# Patient Record
Sex: Male | Born: 1945 | Race: Black or African American | Hispanic: No | Marital: Married | Smoking: Former smoker
Health system: Southern US, Community
[De-identification: ages and names within clinical notes are randomized; demographics above are authoritative.]

## PROBLEM LIST (undated history)

## (undated) DIAGNOSIS — I10 Essential (primary) hypertension: Secondary | ICD-10-CM

## (undated) DIAGNOSIS — I5020 Unspecified systolic (congestive) heart failure: Secondary | ICD-10-CM

## (undated) DIAGNOSIS — I4892 Unspecified atrial flutter: Secondary | ICD-10-CM

## (undated) DIAGNOSIS — J449 Chronic obstructive pulmonary disease, unspecified: Secondary | ICD-10-CM

## (undated) DIAGNOSIS — U071 COVID-19: Secondary | ICD-10-CM

## (undated) DIAGNOSIS — E785 Hyperlipidemia, unspecified: Secondary | ICD-10-CM

## (undated) DIAGNOSIS — I639 Cerebral infarction, unspecified: Secondary | ICD-10-CM

## (undated) HISTORY — PX: HERNIA REPAIR: SHX51

## (undated) HISTORY — DX: Chronic obstructive pulmonary disease, unspecified: J44.9

## (undated) HISTORY — DX: Unspecified systolic (congestive) heart failure: I50.20

## (undated) HISTORY — DX: Essential (primary) hypertension: I10

## (undated) HISTORY — DX: Hyperlipidemia, unspecified: E78.5

## (undated) HISTORY — DX: Cerebral infarction, unspecified: I63.9

## (undated) HISTORY — DX: Unspecified atrial flutter: I48.92

---

## 2003-07-31 ENCOUNTER — Emergency Department (HOSPITAL_COMMUNITY): Admission: EM | Admit: 2003-07-31 | Discharge: 2003-07-31 | Payer: Self-pay | Admitting: Emergency Medicine

## 2008-08-11 ENCOUNTER — Emergency Department (HOSPITAL_COMMUNITY): Admission: EM | Admit: 2008-08-11 | Discharge: 2008-08-12 | Payer: Self-pay | Admitting: Emergency Medicine

## 2008-08-15 ENCOUNTER — Emergency Department (HOSPITAL_COMMUNITY): Admission: EM | Admit: 2008-08-15 | Discharge: 2008-08-16 | Payer: Self-pay | Admitting: Emergency Medicine

## 2009-11-12 ENCOUNTER — Encounter (INDEPENDENT_AMBULATORY_CARE_PROVIDER_SITE_OTHER): Payer: Self-pay | Admitting: *Deleted

## 2009-11-26 ENCOUNTER — Ambulatory Visit: Payer: Self-pay | Admitting: Internal Medicine

## 2009-11-26 DIAGNOSIS — K4090 Unilateral inguinal hernia, without obstruction or gangrene, not specified as recurrent: Secondary | ICD-10-CM | POA: Insufficient documentation

## 2009-11-26 DIAGNOSIS — J42 Unspecified chronic bronchitis: Secondary | ICD-10-CM | POA: Insufficient documentation

## 2009-11-26 DIAGNOSIS — Z8711 Personal history of peptic ulcer disease: Secondary | ICD-10-CM

## 2009-11-26 DIAGNOSIS — F17200 Nicotine dependence, unspecified, uncomplicated: Secondary | ICD-10-CM | POA: Insufficient documentation

## 2009-11-28 ENCOUNTER — Ambulatory Visit: Payer: Self-pay | Admitting: Internal Medicine

## 2009-12-02 ENCOUNTER — Ambulatory Visit: Payer: Self-pay | Admitting: Internal Medicine

## 2009-12-02 LAB — CONVERTED CEMR LAB
BUN: 11 mg/dL (ref 6–23)
Basophils Absolute: 0 10*3/uL (ref 0.0–0.1)
Calcium: 9.6 mg/dL (ref 8.4–10.5)
Creatinine, Ser: 1.3 mg/dL (ref 0.4–1.5)
GFR calc non Af Amer: 71.26 mL/min (ref >60)
Lymphs Abs: 2.5 10*3/uL (ref 0.7–4.0)
Monocytes Absolute: 0.6 10*3/uL (ref 0.1–1.0)
Monocytes Relative: 10.4 % (ref 3.0–12.0)
Neutro Abs: 2.6 10*3/uL (ref 1.4–7.7)
Platelets: 258 10*3/uL (ref 150.0–400.0)
Prothrombin Time: 10.1 s (ref 9.1–11.7)
RDW: 14.1 % (ref 11.5–14.6)
WBC: 5.8 10*3/uL (ref 4.5–10.5)

## 2009-12-04 ENCOUNTER — Telehealth (INDEPENDENT_AMBULATORY_CARE_PROVIDER_SITE_OTHER): Payer: Self-pay | Admitting: *Deleted

## 2009-12-04 ENCOUNTER — Encounter (INDEPENDENT_AMBULATORY_CARE_PROVIDER_SITE_OTHER): Payer: Self-pay | Admitting: *Deleted

## 2009-12-10 ENCOUNTER — Encounter (INDEPENDENT_AMBULATORY_CARE_PROVIDER_SITE_OTHER): Payer: Self-pay | Admitting: *Deleted

## 2009-12-18 ENCOUNTER — Telehealth: Payer: Self-pay | Admitting: Internal Medicine

## 2009-12-23 ENCOUNTER — Ambulatory Visit (HOSPITAL_COMMUNITY): Admission: RE | Admit: 2009-12-23 | Discharge: 2009-12-23 | Payer: Self-pay | Admitting: Internal Medicine

## 2009-12-25 ENCOUNTER — Encounter (INDEPENDENT_AMBULATORY_CARE_PROVIDER_SITE_OTHER): Payer: Self-pay | Admitting: *Deleted

## 2011-01-15 NOTE — Progress Notes (Signed)
Summary: CT CHEST REFERRAL  Phone Note Outgoing Call   Call placed by: Magdalen Spatz [facility],  December 18, 2009 8:39 AM Call placed to: Patient Summary of Call: REFERENCE CT SCAN OF CHEST REFERRAL...Marland KitchenPATIENT'S CURRENT INSUR PLAN IS A "DISCOUNT" PLAN, AND WILL NOT COVER ANY DIAGNOSTIC TESTING.  I S/W PT ABOUT THIS, HE INFORMS ME HE IS IN THE PROCESS OF CHANGING INSURANCE COVERAGE, AND PREFERS TO WAIT ON HAVING CT UNTIL LATE JANUARY.  IS IT OK FOR PT TO WAIT THAT LONG FOR CT?  PLEASE ADVISE. Initial call taken by: Magdalen Spatz [facility],  December 18, 2009 8:39 AM  Follow-up for Phone Call        Dr.Hopper please advise  Follow-up by: Shonna Chock,  December 18, 2009 9:00 AM  Additional Follow-up for Phone Call Additional follow up Details #1::        this should be addressed ASAP Additional Follow-up by: Marga Melnick MD,  December 18, 2009 9:42 AM    Additional Follow-up for Phone Call Additional follow up Details #2::    I spoke with patient informed him of above.  He is sch'd for CT Chest on 12-23-2009 at [facility] (Per patient's request). Follow-up by: Magdalen Spatz [facility],  December 18, 2009 11:49 AM  Additional Follow-up for Phone Call Additional follow up Details #3:: Details for Additional Follow-up Action Taken: good Additional Follow-up by: Marga Melnick MD,  December 18, 2009 1:33 PM

## 2011-01-15 NOTE — Letter (Signed)
Summary: Results Follow up Letter  [facility] [place]  [address] [place], [place] [address]   Phone: [phone]  Fax: [phone]    12/25/2009 MRN: [id number]  [facility] [address] [place], [place]  [address]  Dear Mr. Alameda,  The following are the results of your recent test(s):  Test         Result    Pap Smear:        Normal _____  Not Normal _____ Comments: ______________________________________________________ Cholesterol: LDL(Bad cholesterol):         Your goal is less than:         HDL (Good cholesterol):       Your goal is more than: Comments:  ______________________________________________________ Mammogram:        Normal _____  Not Normal _____ Comments:  ___________________________________________________________________ Hemoccult:        Normal _____  Not normal _______ Comments:    _____________________________________________________________________ Other Tests: Please see attached labs done on 12/23/09    We routinely do not discuss normal results over the telephone.  If you desire a copy of the results, or you have any questions about this information we can discuss them at your next office visit.   Sincerely,

## 2011-09-16 LAB — WOUND CULTURE: Gram Stain: NONE SEEN

## 2011-09-16 LAB — CBC
HCT: 39.7
Platelets: 313
RBC: 4.04 — ABNORMAL LOW

## 2011-09-16 LAB — DIFFERENTIAL
Basophils Absolute: 0.1
Basophils Relative: 1
Eosinophils Absolute: 0.2
Eosinophils Relative: 2
Lymphocytes Relative: 35
Monocytes Absolute: 0.5
Monocytes Relative: 6
Neutro Abs: 4.3

## 2013-04-28 ENCOUNTER — Emergency Department (HOSPITAL_COMMUNITY): Payer: Medicare Other

## 2013-04-28 ENCOUNTER — Emergency Department (HOSPITAL_COMMUNITY)
Admission: EM | Admit: 2013-04-28 | Discharge: 2013-04-28 | Disposition: A | Discharge status: Home / Self Care | Payer: Medicare Other | Attending: Emergency Medicine | Admitting: Emergency Medicine

## 2013-04-28 ENCOUNTER — Telehealth (HOSPITAL_COMMUNITY): Payer: Self-pay | Admitting: *Deleted

## 2013-04-28 ENCOUNTER — Encounter (HOSPITAL_COMMUNITY): Payer: Self-pay | Admitting: Emergency Medicine

## 2013-04-28 DIAGNOSIS — N452 Orchitis: Secondary | ICD-10-CM

## 2013-04-28 DIAGNOSIS — N453 Epididymo-orchitis: Secondary | ICD-10-CM | POA: Insufficient documentation

## 2013-04-28 DIAGNOSIS — F17200 Nicotine dependence, unspecified, uncomplicated: Secondary | ICD-10-CM | POA: Insufficient documentation

## 2013-04-28 LAB — BASIC METABOLIC PANEL
CO2: 29 mEq/L (ref 19–32)
Chloride: 104 mEq/L (ref 96–112)
Potassium: 5.1 mEq/L (ref 3.5–5.1)
Sodium: 140 mEq/L (ref 135–145)

## 2013-04-28 LAB — CBC WITH DIFFERENTIAL/PLATELET
Eosinophils Relative: 1 % (ref 0–5)
Lymphs Abs: 1.4 10*3/uL (ref 0.7–4.0)
MCH: 32.9 pg (ref 26.0–34.0)
MCV: 94 fL (ref 78.0–100.0)
Monocytes Relative: 7 % (ref 3–12)
Neutro Abs: 11.9 10*3/uL — ABNORMAL HIGH (ref 1.7–7.7)
RBC: 4.34 MIL/uL (ref 4.22–5.81)

## 2013-04-28 LAB — URINALYSIS, ROUTINE W REFLEX MICROSCOPIC
Bilirubin Urine: NEGATIVE
Glucose, UA: NEGATIVE mg/dL
Ketones, ur: NEGATIVE mg/dL
Protein, ur: NEGATIVE mg/dL
pH: 5 (ref 5.0–8.0)

## 2013-04-28 LAB — URINE MICROSCOPIC-ADD ON

## 2013-04-28 MED ORDER — CIPROFLOXACIN HCL 500 MG PO TABS: 500.0000 mg | ORAL_TABLET | Freq: Two times a day (BID) | ORAL | Status: DC

## 2013-04-28 MED ORDER — CIPROFLOXACIN HCL 500 MG PO TABS
500.0000 mg | ORAL_TABLET | Freq: Once | ORAL | Status: AC
  Administered 2013-04-28: 500 mg via ORAL
  Filled 2013-04-28: qty 1

## 2013-04-28 MED ORDER — IBUPROFEN 800 MG PO TABS
800.0000 mg | ORAL_TABLET | Freq: Once | ORAL | Status: AC
  Administered 2013-04-28: 800 mg via ORAL
  Filled 2013-04-28: qty 1

## 2013-04-28 NOTE — ED Provider Notes (Signed)
History     CSN: [id number]  Arrival date & time 04/28/13  1101   First MD Initiated Contact with Patient 04/28/13 1131      Chief Complaint  Patient presents with  . Groin Swelling    (Consider location/radiation/quality/duration/timing/severity/associated sxs/prior treatment) HPI.... left testicular pain and swelling for 3 days. No urethral discharge. No fever, chills, dysuria. Status post left inguinal hernia repair approximately 3 years ago in [place]. No previous genitourinary problems. Patient is sexually active. Palpation makes symptoms worse. Severity is moderate  History reviewed. No pertinent past medical history.  History reviewed. No pertinent past surgical history.  History reviewed. No pertinent family history.  History  Substance Use Topics  . Smoking status: Current Every Day Smoker  . Smokeless tobacco: Not on file  . Alcohol Use: Yes      Review of Systems  All other systems reviewed and are negative.    Allergies  Review of patient's allergies indicates no known allergies.  Home Medications   Current Outpatient Rx  Name  Route  Sig  Dispense  Refill  . PRESCRIPTION MEDICATION   Oral   Take 1 tablet by mouth 3 (three) times daily as needed (PAIN medication).         . ciprofloxacin (CIPRO) 500 MG tablet   Oral   Take 1 tablet (500 mg total) by mouth 2 (two) times daily. One po bid x 7 days   28 tablet   0     BP 124/105  Pulse 63  Temp(Src) 98 F (36.7 C) (Oral)  Resp 16  SpO2 98%  Physical Exam  Nursing note and vitals reviewed. Constitutional: He is oriented to person, place, and time. He appears well-developed and well-nourished.  HENT:  Head: Normocephalic and atraumatic.  Eyes: Conjunctivae and EOM are normal. Pupils are equal, round, and reactive to light.  Neck: Normal range of motion. Neck supple.  Cardiovascular: Normal rate, regular rhythm and normal heart sounds.   Pulmonary/Chest: Effort normal and breath  sounds normal.  Abdominal: Soft. Bowel sounds are normal.  Genitourinary:  Normal uncircumcised penis.  Right testicle appears normal to palpation.  Left testicle is boggy, edematous and 30% larger than the right.  Left-sided epididymis tender  Musculoskeletal: Normal range of motion.  Neurological: He is alert and oriented to person, place, and time.  Skin: Skin is warm and dry.  Psychiatric: He has a normal mood and affect.    ED Course  Procedures (including critical care time)  Labs Reviewed  BASIC METABOLIC PANEL - Abnormal; Notable for the following:    Glucose, Bld 102 (*)    GFR calc non Af Amer 60 (*)    GFR calc Af Amer 69 (*)    All other components within normal limits  CBC WITH DIFFERENTIAL - Abnormal; Notable for the following:    WBC 14.5 (*)    Neutrophils Relative % 83 (*)    Neutro Abs 11.9 (*)    Lymphocytes Relative 10 (*)    All other components within normal limits  URINALYSIS, ROUTINE W REFLEX MICROSCOPIC - Abnormal; Notable for the following:    APPearance CLOUDY (*)    Hgb urine dipstick TRACE (*)    Leukocytes, UA SMALL (*)    All other components within normal limits  URINE CULTURE  URINE MICROSCOPIC-ADD ON   US Scrotum  04/28/2013   *RADIOLOGY REPORT*  Clinical Data:  Groin swelling.  Left testicular swelling.  SCROTAL ULTRASOUND DOPPLER ULTRASOUND OF THE TESTICLES  Technique: Complete ultrasound examination of the testicles, epididymis, and other scrotal structures was performed.  Color and spectral Doppler ultrasound were also utilized to evaluate blood flow to the testicles.  Comparison:  None  Findings:  Right testis:  4.3 x 8 1.8 x 2.6 cm.  Small testicular cysts on the order of 1 mm.  Normal color Doppler appearance.  Left testis:  4.4 x 2.8 x 2.7 cm.  Edematous on gray scale imaging. Hyperemic on color Doppler imaging.  Right epididymis:  Small epididymal cyst versus spermatocele.  Left epididymis:  Increased blood flow on Doppler imaging.   Hydrocele:  Complex moderate left-sided hydrocele.  Small simple appearing right-sided hydrocele.  Varicocele:  Present on the left.  Pulsed Doppler interrogation of both testes demonstrates low resistance flow bilaterally. A left-sided "scrotal pearl" is  identified and measures 8 mm on image 52.  There is also a probable thrombosed varicocele at 1.2 cm on image 61.  IMPRESSION:  1.  Findings consistent with left sided epididymitis/orchitis. 2.  Left-sided complex hydrocele, likely secondary. 3.  Left sided varicocele with probable thrombosed component. 4.  Left-sided "scrotal pearl", likely the sequelae of remote trauma or torsion of a testicular appendage.   Original Report Authenticated By: Jeronimo Greaves, M.D.   [place] Art/ven Flow Abd Pelv Doppler  04/28/2013   *RADIOLOGY REPORT*  Clinical Data:  Groin swelling.  Left testicular swelling.  SCROTAL ULTRASOUND DOPPLER ULTRASOUND OF THE TESTICLES  Technique: Complete ultrasound examination of the testicles, epididymis, and other scrotal structures was performed.  Color and spectral Doppler ultrasound were also utilized to evaluate blood flow to the testicles.  Comparison:  None  Findings:  Right testis:  4.3 x 8 1.8 x 2.6 cm.  Small testicular cysts on the order of 1 mm.  Normal color Doppler appearance.  Left testis:  4.4 x 2.8 x 2.7 cm.  Edematous on gray scale imaging. Hyperemic on color Doppler imaging.  Right epididymis:  Small epididymal cyst versus spermatocele.  Left epididymis:  Increased blood flow on Doppler imaging.  Hydrocele:  Complex moderate left-sided hydrocele.  Small simple appearing right-sided hydrocele.  Varicocele:  Present on the left.  Pulsed Doppler interrogation of both testes demonstrates low resistance flow bilaterally. A left-sided "scrotal pearl" is  identified and measures 8 mm on image 52.  There is also a probable thrombosed varicocele at 1.2 cm on image 61.  IMPRESSION:  1.  Findings consistent with left sided epididymitis/orchitis.  2.  Left-sided complex hydrocele, likely secondary. 3.  Left sided varicocele with probable thrombosed component. 4.  Left-sided "scrotal pearl", likely the sequelae of remote trauma or torsion of a testicular appendage.   Original Report Authenticated By: Jeronimo Greaves, M.D.     1. Orchitis       MDM  Doppler study reveals epididymitis/orchitis of the left testicle.   Rx Cipro 500 mg twice a day for 14 days.  Followup urologist. Discussed with patient         Donnetta Hutching, MD 04/28/13 1640

## 2013-04-28 NOTE — ED Notes (Signed)
Pt c/o bilateral testicle swelling x 3 days with pain

## 2013-04-29 LAB — URINE CULTURE
Colony Count: NO GROWTH
Culture: NO GROWTH

## 2017-09-08 ENCOUNTER — Encounter (HOSPITAL_COMMUNITY): Payer: Self-pay | Admitting: Emergency Medicine

## 2017-09-08 ENCOUNTER — Emergency Department (HOSPITAL_COMMUNITY): Payer: Medicare PPO

## 2017-09-08 DIAGNOSIS — G629 Polyneuropathy, unspecified: Secondary | ICD-10-CM | POA: Diagnosis present

## 2017-09-08 DIAGNOSIS — I5042 Chronic combined systolic (congestive) and diastolic (congestive) heart failure: Secondary | ICD-10-CM | POA: Diagnosis present

## 2017-09-08 DIAGNOSIS — R296 Repeated falls: Secondary | ICD-10-CM | POA: Diagnosis present

## 2017-09-08 DIAGNOSIS — R471 Dysarthria and anarthria: Secondary | ICD-10-CM | POA: Diagnosis present

## 2017-09-08 DIAGNOSIS — R29810 Facial weakness: Secondary | ICD-10-CM | POA: Diagnosis present

## 2017-09-08 DIAGNOSIS — R29702 NIHSS score 2: Secondary | ICD-10-CM | POA: Diagnosis present

## 2017-09-08 DIAGNOSIS — I4892 Unspecified atrial flutter: Secondary | ICD-10-CM | POA: Diagnosis present

## 2017-09-08 DIAGNOSIS — I429 Cardiomyopathy, unspecified: Secondary | ICD-10-CM | POA: Diagnosis present

## 2017-09-08 DIAGNOSIS — F17210 Nicotine dependence, cigarettes, uncomplicated: Secondary | ICD-10-CM | POA: Diagnosis present

## 2017-09-08 DIAGNOSIS — I110 Hypertensive heart disease with heart failure: Secondary | ICD-10-CM | POA: Diagnosis present

## 2017-09-08 DIAGNOSIS — I63412 Cerebral infarction due to embolism of left middle cerebral artery: Principal | ICD-10-CM | POA: Diagnosis present

## 2017-09-08 DIAGNOSIS — I493 Ventricular premature depolarization: Secondary | ICD-10-CM | POA: Diagnosis present

## 2017-09-08 DIAGNOSIS — I255 Ischemic cardiomyopathy: Secondary | ICD-10-CM | POA: Diagnosis present

## 2017-09-08 DIAGNOSIS — E785 Hyperlipidemia, unspecified: Secondary | ICD-10-CM | POA: Diagnosis present

## 2017-09-08 DIAGNOSIS — R000 Tachycardia, unspecified: Secondary | ICD-10-CM | POA: Diagnosis present

## 2017-09-08 DIAGNOSIS — W19XXXA Unspecified fall, initial encounter: Secondary | ICD-10-CM | POA: Diagnosis present

## 2017-09-08 LAB — URINALYSIS, ROUTINE W REFLEX MICROSCOPIC
Bacteria, UA: NONE SEEN
Bilirubin Urine: NEGATIVE
GLUCOSE, UA: NEGATIVE mg/dL
Ketones, ur: NEGATIVE mg/dL
Leukocytes, UA: NEGATIVE
Nitrite: NEGATIVE
PH: 5 (ref 5.0–8.0)
PROTEIN: NEGATIVE mg/dL
SPECIFIC GRAVITY, URINE: 1.004 — AB (ref 1.005–1.030)
SQUAMOUS EPITHELIAL / LPF: NONE SEEN

## 2017-09-08 LAB — CBC
HEMATOCRIT: 42.2 % (ref 39.0–52.0)
HEMOGLOBIN: 13.7 g/dL (ref 13.0–17.0)
MCH: 32.1 pg (ref 26.0–34.0)
MCHC: 32.5 g/dL (ref 30.0–36.0)
MCV: 98.8 fL (ref 78.0–100.0)
Platelets: 262 10*3/uL (ref 150–400)
RBC: 4.27 MIL/uL (ref 4.22–5.81)
RDW: 14.5 % (ref 11.5–15.5)
WBC: 5.5 10*3/uL (ref 4.0–10.5)

## 2017-09-08 LAB — DIFFERENTIAL
BASOS ABS: 0 10*3/uL (ref 0.0–0.1)
BASOS PCT: 1 %
EOS ABS: 0.1 10*3/uL (ref 0.0–0.7)
Eosinophils Relative: 1 %
LYMPHS ABS: 1.6 10*3/uL (ref 0.7–4.0)
Lymphocytes Relative: 29 %
MONOS PCT: 8 %
Monocytes Absolute: 0.5 10*3/uL (ref 0.1–1.0)
Neutro Abs: 3.4 10*3/uL (ref 1.7–7.7)
Neutrophils Relative %: 61 %

## 2017-09-08 LAB — COMPREHENSIVE METABOLIC PANEL
ALT: 14 U/L — AB (ref 17–63)
AST: 23 U/L (ref 15–41)
Albumin: 4 g/dL (ref 3.5–5.0)
Alkaline Phosphatase: 63 U/L (ref 38–126)
Anion gap: 10 (ref 5–15)
BILIRUBIN TOTAL: 0.8 mg/dL (ref 0.3–1.2)
BUN: 6 mg/dL (ref 6–20)
CO2: 24 mmol/L (ref 22–32)
CREATININE: 1.32 mg/dL — AB (ref 0.61–1.24)
Calcium: 9.4 mg/dL (ref 8.9–10.3)
Chloride: 101 mmol/L (ref 101–111)
GFR, EST NON AFRICAN AMERICAN: 52 mL/min — AB (ref >60)
Glucose, Bld: 94 mg/dL (ref 65–99)
POTASSIUM: 3.8 mmol/L (ref 3.5–5.1)
Sodium: 135 mmol/L (ref 135–145)
TOTAL PROTEIN: 7.3 g/dL (ref 6.5–8.1)

## 2017-09-08 LAB — I-STAT CHEM 8, ED
BUN: 7 mg/dL (ref 6–20)
CREATININE: 1.2 mg/dL (ref 0.61–1.24)
Calcium, Ion: 1.17 mmol/L (ref 1.15–1.40)
Chloride: 101 mmol/L (ref 101–111)
GLUCOSE: 92 mg/dL (ref 65–99)
HCT: 48 % (ref 39.0–52.0)
HEMOGLOBIN: 16.3 g/dL (ref 13.0–17.0)
Potassium: 3.6 mmol/L (ref 3.5–5.1)
Sodium: 138 mmol/L (ref 135–145)
TCO2: 24 mmol/L (ref 22–32)

## 2017-09-08 LAB — PROTIME-INR
INR: 1
PROTHROMBIN TIME: 13.1 s (ref 11.4–15.2)

## 2017-09-08 LAB — APTT: APTT: 32 s (ref 24–36)

## 2017-09-08 LAB — I-STAT TROPONIN, ED: TROPONIN I, POC: 0.02 ng/mL (ref 0.00–0.08)

## 2017-09-08 NOTE — ED Triage Notes (Signed)
Pt reports he has been falling more than usual and feeling weak. Pt reports R sided weakness that has been present "for a while" Grip difference noted on R side. Pt is A/OX4. No other neuro deficits besides grip difference.

## 2017-09-09 ENCOUNTER — Inpatient Hospital Stay (HOSPITAL_COMMUNITY): Payer: Medicare PPO

## 2017-09-09 ENCOUNTER — Other Ambulatory Visit (HOSPITAL_COMMUNITY): Payer: Medicare PPO

## 2017-09-09 ENCOUNTER — Inpatient Hospital Stay (HOSPITAL_COMMUNITY)
Admission: EM | Admit: 2017-09-09 | Discharge: 2017-09-13 | DRG: 065 | Disposition: A | Discharge status: Home / Self Care | Payer: Medicare PPO | Attending: Internal Medicine | Admitting: Internal Medicine

## 2017-09-09 ENCOUNTER — Encounter (HOSPITAL_COMMUNITY): Payer: Self-pay | Admitting: Internal Medicine

## 2017-09-09 DIAGNOSIS — W19XXXA Unspecified fall, initial encounter: Secondary | ICD-10-CM

## 2017-09-09 DIAGNOSIS — G459 Transient cerebral ischemic attack, unspecified: Secondary | ICD-10-CM | POA: Diagnosis present

## 2017-09-09 DIAGNOSIS — I483 Typical atrial flutter: Secondary | ICD-10-CM | POA: Diagnosis not present

## 2017-09-09 DIAGNOSIS — R29898 Other symptoms and signs involving the musculoskeletal system: Secondary | ICD-10-CM

## 2017-09-09 DIAGNOSIS — I639 Cerebral infarction, unspecified: Secondary | ICD-10-CM | POA: Diagnosis present

## 2017-09-09 DIAGNOSIS — R1310 Dysphagia, unspecified: Secondary | ICD-10-CM

## 2017-09-09 DIAGNOSIS — R29810 Facial weakness: Secondary | ICD-10-CM

## 2017-09-09 DIAGNOSIS — I4892 Unspecified atrial flutter: Secondary | ICD-10-CM

## 2017-09-09 DIAGNOSIS — I10 Essential (primary) hypertension: Secondary | ICD-10-CM

## 2017-09-09 DIAGNOSIS — R296 Repeated falls: Secondary | ICD-10-CM

## 2017-09-09 DIAGNOSIS — E785 Hyperlipidemia, unspecified: Secondary | ICD-10-CM | POA: Diagnosis present

## 2017-09-09 DIAGNOSIS — I5022 Chronic systolic (congestive) heart failure: Secondary | ICD-10-CM

## 2017-09-09 LAB — BASIC METABOLIC PANEL
ANION GAP: 11 (ref 5–15)
BUN: 7 mg/dL (ref 6–20)
CHLORIDE: 101 mmol/L (ref 101–111)
CO2: 24 mmol/L (ref 22–32)
Calcium: 9.1 mg/dL (ref 8.9–10.3)
Creatinine, Ser: 1.16 mg/dL (ref 0.61–1.24)
GFR calc non Af Amer: >60 mL/min (ref >60)
Glucose, Bld: 108 mg/dL — ABNORMAL HIGH (ref 65–99)
POTASSIUM: 3.7 mmol/L (ref 3.5–5.1)
SODIUM: 136 mmol/L (ref 135–145)

## 2017-09-09 LAB — CBC
HEMATOCRIT: 43.4 % (ref 39.0–52.0)
HEMOGLOBIN: 14.7 g/dL (ref 13.0–17.0)
MCH: 33.7 pg (ref 26.0–34.0)
MCHC: 33.9 g/dL (ref 30.0–36.0)
MCV: 99.5 fL (ref 78.0–100.0)
Platelets: 277 10*3/uL (ref 150–400)
RBC: 4.36 MIL/uL (ref 4.22–5.81)
RDW: 14.2 % (ref 11.5–15.5)
WBC: 6.3 10*3/uL (ref 4.0–10.5)

## 2017-09-09 LAB — HEMOGLOBIN A1C
HEMOGLOBIN A1C: 5.1 % (ref 4.8–5.6)
MEAN PLASMA GLUCOSE: 99.67 mg/dL

## 2017-09-09 LAB — LIPID PANEL
CHOL/HDL RATIO: 2.6 ratio
Cholesterol: 164 mg/dL (ref 0–200)
HDL: 64 mg/dL (ref >40)
LDL Cholesterol: 88 mg/dL (ref 0–99)
TRIGLYCERIDES: 60 mg/dL (ref <150)
VLDL: 12 mg/dL (ref 0–40)

## 2017-09-09 LAB — TROPONIN I
Troponin I: 0.03 ng/mL (ref <0.03)
Troponin I: <0.03 ng/mL (ref <0.03)
Troponin I: <0.03 ng/mL (ref <0.03)

## 2017-09-09 LAB — TSH: TSH: 3.672 u[IU]/mL (ref 0.350–4.500)

## 2017-09-09 MED ORDER — STROKE: EARLY STAGES OF RECOVERY BOOK
Freq: Once | Status: AC
  Administered 2017-09-09: 06:00:00
  Filled 2017-09-09: qty 1

## 2017-09-09 MED ORDER — GADOBENATE DIMEGLUMINE 529 MG/ML IV SOLN: 15.0000 mL | Freq: Once | INTRAVENOUS | Status: DC

## 2017-09-09 MED ORDER — ASPIRIN 300 MG RE SUPP: 300.0000 mg | Freq: Every day | RECTAL | Status: DC

## 2017-09-09 MED ORDER — ATORVASTATIN CALCIUM 80 MG PO TABS: 80.0000 mg | ORAL_TABLET | Freq: Every day | ORAL | Status: DC

## 2017-09-09 MED ORDER — ACETAMINOPHEN 650 MG RE SUPP: 650.0000 mg | RECTAL | Status: DC | PRN

## 2017-09-09 MED ORDER — ASPIRIN 81 MG PO CHEW
324.0000 mg | CHEWABLE_TABLET | Freq: Once | ORAL | Status: AC
  Administered 2017-09-09: 324 mg via ORAL
  Filled 2017-09-09: qty 4

## 2017-09-09 MED ORDER — ENSURE ENLIVE PO LIQD
237.0000 mL | Freq: Three times a day (TID) | ORAL | Status: DC
  Administered 2017-09-09 – 2017-09-13 (×12): 237 mL via ORAL
  Filled 2017-09-09 (×16): qty 237

## 2017-09-09 MED ORDER — ASPIRIN 325 MG PO TABS
325.0000 mg | ORAL_TABLET | Freq: Every day | ORAL | Status: DC
  Administered 2017-09-09 – 2017-09-10 (×2): 325 mg via ORAL
  Filled 2017-09-09 (×2): qty 1

## 2017-09-09 MED ORDER — ACETAMINOPHEN 325 MG PO TABS
650.0000 mg | ORAL_TABLET | ORAL | Status: DC | PRN
  Administered 2017-09-11 (×2): 650 mg via ORAL
  Filled 2017-09-09 (×2): qty 2

## 2017-09-09 MED ORDER — ATORVASTATIN CALCIUM 10 MG PO TABS
20.0000 mg | ORAL_TABLET | Freq: Every day | ORAL | Status: DC
  Administered 2017-09-09 – 2017-09-12 (×4): 20 mg via ORAL
  Filled 2017-09-09 (×4): qty 2

## 2017-09-09 MED ORDER — ACETAMINOPHEN 160 MG/5ML PO SOLN: 650.0000 mg | ORAL | Status: DC | PRN

## 2017-09-09 NOTE — Progress Notes (Signed)
[facility]                                                                              Patient Demographics  Nathan Perez, is a 71 y.o. male, DOB - [birth year], QIH:[id number]  Admit date - 09/09/2017   Admitting Physician Pearson Grippe, MD  Outpatient Primary MD for the patient is Pecola Lawless, MD  Outpatient specialists:   LOS - 0  days   Medical records reviewed and are as summarized below:    Chief Complaint  Patient presents with  . Weakness       Brief summary   Patient is a 71 year old male who had a fall on the day of admission at about 5:30 PM. Subsequently went inside and fell in his room again. Patient was brought to the ED by his family. Initial CT head was negative, telemetry showed atrial flutter 85-108. Patient was admitted due to the concern for CVA   Assessment & Plan    Principal Problem:   Acute CVA (cerebrovascular accident) (HCC)presenting with falls - MRI of the brain showed positive several small acute infarcts in the posterior left MCA territory including involvement of the posterior lateral left motor strip, question about punctate acute or subacute lacunar infarct superimposed in the right cerebellum, may be artifact. No associated hemorrhage or mass effect. - Negative intracranial MRA head and neck - placed on aspirin 325 mg daily, Lipitor - Follow 2-D echo - PT OT, ST evaluation - LDL 88, on statin, hemoglobin A1c 5.1  Active Problems:   Hypertension - permissive hypertension for 24 hours    Atrial flutter (HCC) - EKG showed sinus tachycardia with PVCs, will await neurology recommendations if patient needs to be on anticoagulation, TEE or loop    Hyperlipidemia - lDL 88, goal less than 70, placed on statin   Code Status: full code  DVT Prophylaxis:   SCD's Family Communication: Discussed in detail with the patient, all imaging results, lab results explained to the patient   Disposition Plan:   Time Spent in minutes   25 minutes  Procedures:  MRI brain, MRA head and neck  Consultants:   neurology  Antimicrobials:      Medications  Scheduled Meds: . aspirin  300 mg Rectal Daily   Or  . aspirin  325 mg Oral Daily  . atorvastatin  80 mg Oral q1800  . gadobenate dimeglumine  15 mL Intravenous Once   Continuous Infusions: PRN Meds:.acetaminophen **OR** acetaminophen (TYLENOL) oral liquid 160 mg/5 mL **OR** acetaminophen   Antibiotics   Anti-infectives    None        Subjective:   Nathan Perez was seen and examined today. Denies any specific complaints at this time. Patient denies dizziness, chest pain, shortness of breath, abdominal pain, N/V/D/C, new weakness, numbess, tingling.   Objective:   Vitals:   09/09/17 0609 09/09/17 0800 09/09/17 0953 09/09/17 1200  BP: (!) 159/93 (!) 141/86 (!) 145/73 (!) 134/98  Pulse: 97 90 100 69  Resp: 14   18  Temp: 98.1 F (36.7 C)     TempSrc: Oral Oral    SpO2: 99% 100%  100%   Weight:      Height:        Intake/Output Summary (Last 24 hours) at 09/09/17 1404 Last data filed at 09/09/17 1000  Gross per 24 hour  Intake              240 ml  Output              300 ml  Net              -60 ml     Wt Readings from Last 3 Encounters:  09/09/17 67 kg (147 lb 12.8 oz)  11/26/09 75.6 kg (166 lb 9.6 oz)     Exam  General: Alert and oriented x 3, NAD  Eyes:   HEENT:  Atraumatic, normocephalic  Cardiovascular: S1 S2 auscultated, no rubs, murmurs or gallops. Regular rate and rhythm.  Respiratory: Clear to auscultation bilaterally, no wheezing, rales or rhonchi  Gastrointestinal: Soft, nontender, nondistended, + bowel sounds  Ext: no pedal edema bilaterally  Neuro: AAOx3, Cr N's II- XII. Strength 5/5 upper and lower extremities bilaterally, speech clear  Musculoskeletal: No digital cyanosis, clubbing  Skin: No rashes  Psych: Normal affect and demeanor, alert and oriented x3    Data Reviewed:  I have personally reviewed  following labs and imaging studies  Micro Results No results found for this or any previous visit (from the past 240 hour(s)).  Radiology Reports Dg Chest 2 View  Result Date: 09/09/2017 CLINICAL DATA:  Multiple falls and difficulty swallowing. EXAM: CHEST  2 VIEW COMPARISON:  12/23/2009 chest CT and 12/02/2009 chest radiograph FINDINGS: Cardiomediastinal silhouette is unchanged. 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 There is no evidence of focal airspace disease, pulmonary edema, suspicious pulmonary nodule/mass, pleural effusion, or pneumothorax. No acute bony abnormalities are identified. IMPRESSION: No active cardiopulmonary disease. Electronically Signed   By: Harmon Pier M.D.   On: 09/09/2017 13:17   Ct Head Wo Contrast  Result Date: 09/08/2017 CLINICAL DATA:  71 y/o M; 2 days of gait problems and right arm weakness for 1 day. EXAM: CT HEAD WITHOUT CONTRAST TECHNIQUE: Contiguous axial images were obtained from the base of the skull through the vertex without intravenous contrast. COMPARISON:  None. FINDINGS: Brain: No evidence of acute infarction, hemorrhage, hydrocephalus, extra-axial collection or mass lesion/mass effect. Nonspecific foci of hypoattenuation in subcortical and periventricular white matter are compatible with mild chronic microvascular ischemic changes and there is mild parenchymal volume loss of the brain for age. Vascular: Calcific atherosclerosis of carotid siphons. No hyperdense vessel identified. Skull: Normal. Negative for fracture or focal lesion. Sinuses/Orbits: No acute finding. Other: Opacification of external auditory canals, likely cerumen. IMPRESSION: 1. No acute intracranial abnormality identified. If symptoms persist or if clinically indicated MRI is more sensitive for stroke. 2. Mild chronic microvascular ischemic changes and parenchymal volume loss of the brain for age. Electronically Signed   By: Mitzi Hansen M.D.   On:  09/08/2017 22:13   Mr Maxine Glenn Neck W Wo Contrast  Result Date: 09/09/2017 CLINICAL DATA:  71 year old male with chorea movements of the right arm beginning 3 days ago. EXAM: MRI HEAD WITHOUT CONTRAST MRA HEAD WITHOUT CONTRAST MRA NECK WITHOUT AND WITH CONTRAST TECHNIQUE: Multiplanar, multiecho pulse sequences of the brain and surrounding structures were obtained without intravenous contrast. Angiographic images of the Circle of Willis were obtained using MRA technique without intravenous contrast. Angiographic images of the neck were obtained using MRA technique without and with intravenous contrast. Carotid stenosis  measurements (when applicable) are obtained utilizing NASCET criteria, using the distal internal carotid diameter as the denominator. CONTRAST:  15 mL MultiHance COMPARISON:  Head CT without contrast 09/08/2017. FINDINGS: MRI HEAD FINDINGS Brain: Small nodular cortical and subcortical white matter foci of restricted diffusion along the superolateral left motor strip (series 3, image 35 and series 4, image 14), and in the posterior left parietal lobe with little to no associated T2 or FLAIR hyperintensity. No contralateral right hemisphere restricted diffusion. Questionable tiny focus of restricted diffusion in the central right cerebellum (series 4, image 9). No acute or chronic intracranial hemorrhage. No mass effect. No evidence of mass lesion, ventriculomegaly, extra-axial collection. Cervicomedullary junction and pituitary are within normal limits. Patchy and confluent as well as widely scattered foci of abnormal cerebral white matter T2 and FLAIR hyperintensity in both hemispheres. No cortical encephalomalacia identified. Signal in the bilateral deep gray matter nuclei appears normal. Normal brainstem. Vascular: Major intracranial vascular flow voids are preserved. Skull and upper cervical spine: Negative. Normal bone marrow signal. Sinuses/Orbits: Normal orbits soft tissues. Scattered mild areas  of paranasal sinus mucosal thickening or small retention cyst. Other: Grossly normal visible internal auditory structures. Mastoid air cells are clear. Scalp and face soft tissues appear negative. MRA NECK FINDINGS Precontrast time-of-flight images demonstrate antegrade flow in both carotid and vertebral arteries throughout the neck. The right vertebral artery appears dominant. Post-contrast neck MRA images reveal a 3 vessel arch configuration, although the left vertebral arises very near the arch. No great vessel origin stenosis suspected. Both common carotid arteries, carotid bifurcations, and cervical ICAs are normal for age. Minimal atherosclerotic irregularity and no stenosis in the neck. The right vertebral artery is dominant and patent throughout the neck without stenosis although its origin is not well visualized. On source images the left vertebral artery is seen to arise very proximal near the aortic arch. The proximal left vertebral artery is poorly visualized on MIPS images. The left vertebral remains patent to the skullbase without significant stenosis suspected. MRA HEAD FINDINGS The distal left vertebral artery terminates in PICA. The distal right vertebral artery supplies the basilar. No distal right vertebral artery stenosis. The basilar is patent without stenosis. Fetal type bilateral PCA origins, more so the left. SCA origins are patent. Bilateral PCA branches are within normal limits. Antegrade flow in both ICA siphons. Mild siphon irregularity without stenosis. Normal ophthalmic and posterior communicating artery origins. Patent carotid termini. Normal MCA and ACA origins. Anterior communicating artery and visible ACA branches are within normal limits. Left MCA M1 segment, bifurcation, and visible left MCA branches are within normal limits. Right MCA M1 segment, bifurcation, and visible right MCA branches are within normal limits. IMPRESSION: 1. Positive for several small acute infarcts in the  posterior left MCA territory, including involvement of the posterolateral left motor strip. 2. Questionable punctate acute or subacute lacunar infarct superimposed in the right cerebellum, but this may be artifact. 3. No associated hemorrhage or mass effect. 4. Underlying moderately advanced for age but nonspecific bilateral cerebral white matter T2 and FLAIR hyperintensity. 5. Normal noncontrast MRI appearance of the deep gray matter nuclei and brainstem. 6. Negative for age Neck MRA. 7. Negative for age intracranial MRA. Visible left MCA branches are normal. 8. Incidental normal vascular anatomic variants including dominant right vertebral artery which supplies the basilar, non dominant the left arises near the aortic arch and terminates in the left PICA, and fetal type bilateral PCA origins. Electronically Signed   By: Odessa Fleming  M.D.   On: 09/09/2017 13:49   Mr Brain Wo Contrast  Result Date: 09/09/2017 CLINICAL DATA:  71 year old male with chorea movements of the right arm beginning 3 days ago. EXAM: MRI HEAD WITHOUT CONTRAST MRA HEAD WITHOUT CONTRAST MRA NECK WITHOUT AND WITH CONTRAST TECHNIQUE: Multiplanar, multiecho pulse sequences of the brain and surrounding structures were obtained without intravenous contrast. Angiographic images of the Circle of Willis were obtained using MRA technique without intravenous contrast. Angiographic images of the neck were obtained using MRA technique without and with intravenous contrast. Carotid stenosis measurements (when applicable) are obtained utilizing NASCET criteria, using the distal internal carotid diameter as the denominator. CONTRAST:  15 mL MultiHance COMPARISON:  Head CT without contrast 09/08/2017. FINDINGS: MRI HEAD FINDINGS Brain: Small nodular cortical and subcortical white matter foci of restricted diffusion along the superolateral left motor strip (series 3, image 35 and series 4, image 14), and in the posterior left parietal lobe with little to no  associated T2 or FLAIR hyperintensity. No contralateral right hemisphere restricted diffusion. Questionable tiny focus of restricted diffusion in the central right cerebellum (series 4, image 9). No acute or chronic intracranial hemorrhage. No mass effect. No evidence of mass lesion, ventriculomegaly, extra-axial collection. Cervicomedullary junction and pituitary are within normal limits. Patchy and confluent as well as widely scattered foci of abnormal cerebral white matter T2 and FLAIR hyperintensity in both hemispheres. No cortical encephalomalacia identified. Signal in the bilateral deep gray matter nuclei appears normal. Normal brainstem. Vascular: Major intracranial vascular flow voids are preserved. Skull and upper cervical spine: Negative. Normal bone marrow signal. Sinuses/Orbits: Normal orbits soft tissues. Scattered mild areas of paranasal sinus mucosal thickening or small retention cyst. Other: Grossly normal visible internal auditory structures. Mastoid air cells are clear. Scalp and face soft tissues appear negative. MRA NECK FINDINGS Precontrast time-of-flight images demonstrate antegrade flow in both carotid and vertebral arteries throughout the neck. The right vertebral artery appears dominant. Post-contrast neck MRA images reveal a 3 vessel arch configuration, although the left vertebral arises very near the arch. No great vessel origin stenosis suspected. Both common carotid arteries, carotid bifurcations, and cervical ICAs are normal for age. Minimal atherosclerotic irregularity and no stenosis in the neck. The right vertebral artery is dominant and patent throughout the neck without stenosis although its origin is not well visualized. On source images the left vertebral artery is seen to arise very proximal near the aortic arch. The proximal left vertebral artery is poorly visualized on MIPS images. The left vertebral remains patent to the skullbase without significant stenosis suspected. MRA  HEAD FINDINGS The distal left vertebral artery terminates in PICA. The distal right vertebral artery supplies the basilar. No distal right vertebral artery stenosis. The basilar is patent without stenosis. Fetal type bilateral PCA origins, more so the left. SCA origins are patent. Bilateral PCA branches are within normal limits. Antegrade flow in both ICA siphons. Mild siphon irregularity without stenosis. Normal ophthalmic and posterior communicating artery origins. Patent carotid termini. Normal MCA and ACA origins. Anterior communicating artery and visible ACA branches are within normal limits. Left MCA M1 segment, bifurcation, and visible left MCA branches are within normal limits. Right MCA M1 segment, bifurcation, and visible right MCA branches are within normal limits. IMPRESSION: 1. Positive for several small acute infarcts in the posterior left MCA territory, including involvement of the posterolateral left motor strip. 2. Questionable punctate acute or subacute lacunar infarct superimposed in the right cerebellum, but this may be artifact. 3. No associated  hemorrhage or mass effect. 4. Underlying moderately advanced for age but nonspecific bilateral cerebral white matter T2 and FLAIR hyperintensity. 5. Normal noncontrast MRI appearance of the deep gray matter nuclei and brainstem. 6. Negative for age Neck MRA. 7. Negative for age intracranial MRA. Visible left MCA branches are normal. 8. Incidental normal vascular anatomic variants including dominant right vertebral artery which supplies the basilar, non dominant the left arises near the aortic arch and terminates in the left PICA, and fetal type bilateral PCA origins. Electronically Signed   By: Odessa Fleming M.D.   On: 09/09/2017 13:49   Mr Maxine Glenn Head Wo Contrast  Result Date: 09/09/2017 CLINICAL DATA:  71 year old male with chorea movements of the right arm beginning 3 days ago. EXAM: MRI HEAD WITHOUT CONTRAST MRA HEAD WITHOUT CONTRAST MRA NECK WITHOUT AND  WITH CONTRAST TECHNIQUE: Multiplanar, multiecho pulse sequences of the brain and surrounding structures were obtained without intravenous contrast. Angiographic images of the Circle of Willis were obtained using MRA technique without intravenous contrast. Angiographic images of the neck were obtained using MRA technique without and with intravenous contrast. Carotid stenosis measurements (when applicable) are obtained utilizing NASCET criteria, using the distal internal carotid diameter as the denominator. CONTRAST:  15 mL MultiHance COMPARISON:  Head CT without contrast 09/08/2017. FINDINGS: MRI HEAD FINDINGS Brain: Small nodular cortical and subcortical white matter foci of restricted diffusion along the superolateral left motor strip (series 3, image 35 and series 4, image 14), and in the posterior left parietal lobe with little to no associated T2 or FLAIR hyperintensity. No contralateral right hemisphere restricted diffusion. Questionable tiny focus of restricted diffusion in the central right cerebellum (series 4, image 9). No acute or chronic intracranial hemorrhage. No mass effect. No evidence of mass lesion, ventriculomegaly, extra-axial collection. Cervicomedullary junction and pituitary are within normal limits. Patchy and confluent as well as widely scattered foci of abnormal cerebral white matter T2 and FLAIR hyperintensity in both hemispheres. No cortical encephalomalacia identified. Signal in the bilateral deep gray matter nuclei appears normal. Normal brainstem. Vascular: Major intracranial vascular flow voids are preserved. Skull and upper cervical spine: Negative. Normal bone marrow signal. Sinuses/Orbits: Normal orbits soft tissues. Scattered mild areas of paranasal sinus mucosal thickening or small retention cyst. Other: Grossly normal visible internal auditory structures. Mastoid air cells are clear. Scalp and face soft tissues appear negative. MRA NECK FINDINGS Precontrast time-of-flight images  demonstrate antegrade flow in both carotid and vertebral arteries throughout the neck. The right vertebral artery appears dominant. Post-contrast neck MRA images reveal a 3 vessel arch configuration, although the left vertebral arises very near the arch. No great vessel origin stenosis suspected. Both common carotid arteries, carotid bifurcations, and cervical ICAs are normal for age. Minimal atherosclerotic irregularity and no stenosis in the neck. The right vertebral artery is dominant and patent throughout the neck without stenosis although its origin is not well visualized. On source images the left vertebral artery is seen to arise very proximal near the aortic arch. The proximal left vertebral artery is poorly visualized on MIPS images. The left vertebral remains patent to the skullbase without significant stenosis suspected. MRA HEAD FINDINGS The distal left vertebral artery terminates in PICA. The distal right vertebral artery supplies the basilar. No distal right vertebral artery stenosis. The basilar is patent without stenosis. Fetal type bilateral PCA origins, more so the left. SCA origins are patent. Bilateral PCA branches are within normal limits. Antegrade flow in both ICA siphons. Mild siphon irregularity without stenosis. Normal  ophthalmic and posterior communicating artery origins. Patent carotid termini. Normal MCA and ACA origins. Anterior communicating artery and visible ACA branches are within normal limits. Left MCA M1 segment, bifurcation, and visible left MCA branches are within normal limits. Right MCA M1 segment, bifurcation, and visible right MCA branches are within normal limits. IMPRESSION: 1. Positive for several small acute infarcts in the posterior left MCA territory, including involvement of the posterolateral left motor strip. 2. Questionable punctate acute or subacute lacunar infarct superimposed in the right cerebellum, but this may be artifact. 3. No associated hemorrhage or mass  effect. 4. Underlying moderately advanced for age but nonspecific bilateral cerebral white matter T2 and FLAIR hyperintensity. 5. Normal noncontrast MRI appearance of the deep gray matter nuclei and brainstem. 6. Negative for age Neck MRA. 7. Negative for age intracranial MRA. Visible left MCA branches are normal. 8. Incidental normal vascular anatomic variants including dominant right vertebral artery which supplies the basilar, non dominant the left arises near the aortic arch and terminates in the left PICA, and fetal type bilateral PCA origins. Electronically Signed   By: Odessa Fleming M.D.   On: 09/09/2017 13:49    Lab Data:  CBC:  Recent Labs Lab 09/08/17 2008 09/08/17 2028 09/09/17 0925  WBC 5.5  --  6.3  NEUTROABS 3.4  --   --   HGB 13.7 16.3 14.7  HCT 42.2 48.0 43.4  MCV 98.8  --  99.5  PLT 262  --  277   Basic Metabolic Panel:  Recent Labs Lab 09/08/17 2008 09/08/17 2028 09/09/17 0925  NA 135 138 136  K 3.8 3.6 3.7  CL 101 101 101  CO2 24  --  24  GLUCOSE 94 92 108*  BUN CREATININE 1.32* 1.20 1.16  CALCIUM 9.4  --  9.1   GFR: Estimated Creatinine Clearance: 54.5 mL/min (by C-G formula based on SCr of 1.16 mg/dL). Liver Function Tests:  Recent Labs Lab 09/08/17 2008  AST 23  ALT 14*  ALKPHOS 63  BILITOT 0.8  PROT 7.3  ALBUMIN 4.0   No results for input(s): LIPASE, AMYLASE in the last 168 hours. No results for input(s): AMMONIA in the last 168 hours. Coagulation Profile:  Recent Labs Lab 09/08/17 2008  INR 1.00   Cardiac Enzymes:  Recent Labs Lab 09/09/17 0351 09/09/17 0925  TROPONINI 0.03* <0.03   BNP (last 3 results) No results for input(s): PROBNP in the last 8760 hours. HbA1C:  Recent Labs  09/09/17 0925  HGBA1C 5.1   CBG: No results for input(s): GLUCAP in the last 168 hours. Lipid Profile:  Recent Labs  09/09/17 0925  CHOL 164  HDL 64  LDLCALC 88  TRIG 60  CHOLHDL 2.6   Thyroid Function Tests:  Recent Labs   09/09/17 0351  TSH 3.672   Anemia Panel: No results for input(s): VITAMINB12, FOLATE, FERRITIN, TIBC, IRON, RETICCTPCT in the last 72 hours. Urine analysis:    Component Value Date/Time   COLORURINE STRAW (A) 09/08/2017 2045   APPEARANCEUR CLEAR 09/08/2017 2045   LABSPEC 1.004 (L) 09/08/2017 2045   PHURINE 5.0 09/08/2017 2045   GLUCOSEU NEGATIVE 09/08/2017 2045   HGBUR LARGE (A) 09/08/2017 2045   BILIRUBINUR NEGATIVE 09/08/2017 2045   KETONESUR NEGATIVE 09/08/2017 2045   PROTEINUR NEGATIVE 09/08/2017 2045   UROBILINOGEN 1.0 04/28/2013 1205   NITRITE NEGATIVE 09/08/2017 2045   LEUKOCYTESUR NEGATIVE 09/08/2017 2045     Bernardine Langworthy M.D. [facility] 09/09/2017, 2:04 PM  Pager: [phone] Between 7am to 7pm - call Pager - [phone]  After 7pm go to [email or web address] - password [email or web address]  Call night coverage person covering after 7pm

## 2017-09-09 NOTE — Evaluation (Signed)
Physical Therapy Evaluation Patient Details Name: CECILIO Perez MRN: [id number] DOB: [birth year] Today's Date: 09/09/2017   History of Present Illness  Pt is a 71 y.o. male, smoker with c/o fall outside earlier today at about 5;30pm,  Denies dizziness, syncope, cp, palp, sob, n/v, diarrhea, brbpr.  Went inside today and fell in his room. CT-. 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   Clinical Impression  Pt presented supine in bed with HOB elevated, awake and willing to participate in therapy session. Pt's family present throughout session. Prior to admission, pt reported that he was independent with all functional mobility and ADLs. Pt admitted that he has had multiple falls in the past few months where he "loses his balance". Pt ambulated in hallway without use of an AD, with close min guard for safety. During higher level balance assessment, pt had one LOB which he was able to self-correct without UE support. Pt would benefit from further therapy services in the Neuro OP PT. Pt would continue to benefit from skilled physical therapy services at this time while admitted and after d/c to address the below listed limitations in order to improve overall safety and independence with functional mobility.     Follow Up Recommendations Outpatient PT;Other (comment) (Neuro OP PT)    Equipment Recommendations  None recommended by PT    Recommendations for Other Services       Precautions / Restrictions Precautions Precautions: Fall Restrictions Weight Bearing Restrictions: No      Mobility  Bed Mobility Overal bed mobility: Modified Independent                Transfers Overall transfer level: Needs assistance Equipment used: None Transfers: Sit to/from Stand Sit to Stand: Supervision         General transfer comment: supervision for  safety  Ambulation/Gait Ambulation/Gait assistance: Min guard Ambulation Distance (Feet): 300 Feet Assistive device: None Gait Pattern/deviations: Step-through pattern;Decreased step length - right;Decreased step length - left;Decreased stride length;Drifts right/left;Narrow base of support Gait velocity: decreased Gait velocity interpretation: Below normal speed for age/gender General Gait Details: pt with modest instability and LOB x1 with vertical head turns but able to self-correct, close min guard for safety  Stairs Stairs: Yes Stairs assistance: Supervision Stair Management: Two rails;Alternating pattern;Forwards Number of Stairs: 2 General stair comments: no instability or concerns  Wheelchair Mobility    Modified Rankin (Stroke Patients Only) Modified Rankin (Stroke Patients Only) Pre-Morbid Rankin Score: Slight disability Modified Rankin: Moderate disability     Balance Overall balance assessment: Needs assistance Sitting-balance support: Feet supported Sitting balance-Leahy Scale: Good     Standing balance support: During functional activity;No upper extremity supported Standing balance-Leahy Scale: Fair                   Standardized Balance Assessment Standardized Balance Assessment : Dynamic Gait Index   Dynamic Gait Index Level Surface: Normal Change in Gait Speed: Normal Gait with Horizontal Head Turns: Mild Impairment Gait with Vertical Head Turns: Mild Impairment Gait and Pivot Turn: Mild Impairment Step Over Obstacle: Mild Impairment Step Around Obstacles: Normal Steps: Mild Impairment Total Score: 19       Pertinent Vitals/Pain Pain Assessment: Faces Faces Pain Scale: Hurts a little bit Pain Location: R hand Pain Descriptors / Indicators: Discomfort Pain Intervention(s): Monitored during session;Repositioned    Home Living Family/patient expects to be discharged to:: Private residence Living Arrangements:  Other (Comment) (rooming  house) Available Help at Discharge: Family Type of Home: Other(Comment) Home Access: Stairs to enter Entrance Stairs-Rails: Doctor, general practice of Steps: 3 Home Layout: Two level;Able to live on main level with bedroom/bathroom Home Equipment: Dan Humphreys - 2 wheels;Cane - single point;Bedside commode;Crutches;Shower seat;Other (comment) (Hoyer lift) Additional Comments: Lives in "rooming house" butcan stay with wife if needed, who has a house    Prior Function Level of Independence: Independent               Hand Dominance   Dominant Hand: Left    Extremity/Trunk Assessment   Upper Extremity Assessment Upper Extremity Assessment: Defer to OT evaluation    Lower Extremity Assessment Lower Extremity Assessment: Generalized weakness    Cervical / Trunk Assessment Cervical / Trunk Assessment: Normal  Communication   Communication: No difficulties  Cognition Arousal/Alertness: Awake/alert Behavior During Therapy: WFL for tasks assessed/performed Overall Cognitive Status: Within Functional Limits for tasks assessed                                        General Comments      Exercises     Assessment/Plan    PT Assessment Patient needs continued PT services  PT Problem List Decreased strength;Decreased activity tolerance;Decreased balance;Decreased mobility;Decreased coordination;Decreased knowledge of use of DME;Decreased safety awareness;Decreased knowledge of precautions       PT Treatment Interventions Gait training;DME instruction;Stair training;Functional mobility training;Therapeutic activities;Therapeutic exercise;Balance training;Neuromuscular re-education;Patient/family education    PT Goals (Current goals can be found in the Care Plan section)  Acute Rehab PT Goals Patient Stated Goal: to go home PT Goal Formulation: With patient Time For Goal Achievement: 09/23/17 Potential to Achieve Goals: Good    Frequency Min  4X/week   Barriers to discharge        Co-evaluation               AM-PAC PT "6 Clicks" Daily Activity  Outcome Measure Difficulty turning over in bed (including adjusting bedclothes, sheets and blankets)?: None Difficulty moving from lying on back to sitting on the side of the bed? : None Difficulty sitting down on and standing up from a chair with arms (e.g., wheelchair, bedside commode, etc,.)?: A Little Help needed moving to and from a bed to chair (including a wheelchair)?: None Help needed walking in hospital room?: A Little Help needed climbing 3-5 steps with a railing? : A Little 6 Click Score: 21    End of Session   Activity Tolerance: Patient tolerated treatment well Patient left: with call bell/phone within reach;with family/visitor present;Other (comment) (in the bathroom) Nurse Communication: Mobility status PT Visit Diagnosis: Other abnormalities of gait and mobility (R26.89)    Time: 1191-4782 PT Time Calculation (min) (ACUTE ONLY): 16 min   Charges:   PT Evaluation $PT Eval Moderate Complexity: 1 Mod     PT G Codes:        [place], PT, DPT [phone]   Alessandra Bevels Esteban Kobashigawa 09/09/2017, 5:23 PM

## 2017-09-09 NOTE — Evaluation (Signed)
Speech Language Pathology Evaluation Patient Details Name: MAISON KESTENBAUM MRN: [id number] DOB: [birth year] Today's Date: 09/09/2017 Time: 4098-1191 SLP Time Calculation (min) (ACUTE ONLY): 15 min  Problem List:  Patient Active Problem List   Diagnosis Date Noted  . Hypertension 09/09/2017  . Atrial flutter (HCC) 09/09/2017  . TIA (transient ischemic attack) 09/09/2017  . SMOKER 11/26/2009  . BRONCHITIS, CHRONIC 11/26/2009  . INGUINAL HERNIA, LEFT 11/26/2009  . DUODENAL ULCER, HX OF 11/26/2009   Past Medical History: History reviewed. No pertinent past medical history. Past Surgical History:  Past Surgical History:  Procedure Laterality Date  . HERNIA REPAIR     HPI:  Pt is a 71 y.o.M with history of tobacco use who presents to the emergency department with complaints of 2 falls today. He reports over the past month he has felt "off balance". He states since Monday, September 24 he has had right upper extremity weakness where he dropped something that he was holding. He then had an episode where he felt like he did not have any control over his arm and there are weird spontaneous movements. He was alert and conscious during this episode. No numbness or tingling. No headache. Denies any recent head injury. He is not on antiplatelets or anticoagulants. Denies history of stroke. CT didn't reveal any acute intracranial abnormality but did reveal mild chronic microvascular ischemic changes and parenchymal volum loss of the brain for age. Pt passed stroke swallow screen.    Assessment / Plan / Recommendation Clinical Impression  Pt presents with functional cogntive skills per age and educational level. No acute changes present during evaluation. Pt with mild flaccid dysarthria d/t right facial droop which impacts his speech intelligibility. Pt is ~ 75% intelligible at the sentence level. He doesn't appear aware of decreased speech intelligibility. With Min A cues, pt able to repeat himself with  increased intelligibility. Of note, pt with 20 lb weight loss in last 3 months. MD in wot assess pt during ST evaluation and infomration shared with MD. At bedside pt currently drinking thin liquids via straw with no overt s/s of aspiration. Pt states that he always fills full even when eating small quanitites of food. MD changed diet from NPO to dysphagia 3 with thin liquids and ordered esophagram. MD will pursue further BSE with ST services if esophagram warrents further information. At this time, ST to follow pt for dysarthria.     SLP Assessment  SLP Recommendation/Assessment: Patient needs continued Speech Lanaguage Pathology Services SLP Visit Diagnosis: Dysarthria and anarthria (R47.1)    Follow Up Recommendations  None    Frequency and Duration min 2x/week  2 weeks      SLP Evaluation Cognition  Overall Cognitive Status: Within Functional Limits for tasks assessed Arousal/Alertness: Awake/alert Orientation Level: Oriented X4 Attention: Sustained Sustained Attention: Appears intact Memory: Appears intact Awareness: Appears intact Problem Solving: Appears intact Safety/Judgment: Appears intact       Comprehension  Auditory Comprehension Overall Auditory Comprehension: Appears within functional limits for tasks assessed Yes/No Questions: Within Functional Limits Commands: Within Functional Limits Conversation: Simple    Expression Expression Primary Mode of Expression: Verbal Verbal Expression Overall Verbal Expression: Impaired Initiation: No impairment Automatic Speech: Name;Social Response;Day of week;Month of year Level of Generative/Spontaneous Verbalization: Sentence Repetition: No impairment Naming: No impairment Pragmatics: No impairment Non-Verbal Means of Communication: Not applicable   Oral / Motor  Oral Motor/Sensory Function Overall Oral Motor/Sensory Function: Mild impairment Facial ROM: Reduced right;Suspected CN VII (facial) dysfunction Facial  Symmetry:  Abnormal symmetry right;Suspected CN VII (facial) dysfunction Facial Strength: Reduced right;Suspected CN VII (facial) dysfunction Lingual ROM: Reduced right Lingual Symmetry: Within Functional Limits Lingual Strength: Within Functional Limits Lingual Sensation: Within Functional Limits Motor Speech Overall Motor Speech: Impaired Respiration: Within functional limits Phonation: Normal Resonance: Within functional limits Articulation: Impaired Level of Impairment: Sentence Intelligibility: Intelligibility reduced Word: 75-100% accurate Phrase: 75-100% accurate Sentence: 75-100% accurate Conversation: 75-100% accurate Motor Planning: Witnin functional limits Motor Speech Errors: Not applicable Effective Techniques: Slow rate;Increased vocal intensity;Over-articulate   GO                    Relena Ivancic 09/09/2017, 9:30 AM

## 2017-09-09 NOTE — Consult Note (Signed)
Requesting Physician: Dr. Elesa Massed    Chief Complaint: Right facial droop, fall  History obtained from: Patient and Chart     HPI:                                                                                                                                       Nathan Perez is an 71 y.o. male African-American right-handed with tobacco abuse and no known other vascular risk factors presents to the emergency room after 2 falls today.  The patient states that since a month he has been having gait imbalance and has repeated falls. He had 2 falls earlier today and states he feels weaker on the right side. On Monday he describes that his right arm was going 'crazy', he did not have control of his right arm was having choreiform movements. Also noted in the emergency room that he had a right facial droop which the patient himself was unaware of. Denies any other symptoms such as vision loss, slurred speech, difficulty with words, extremity weakness. Triage nurse noticed that he had a slightly weaker right hand grip. His blood pressure is 159/82 on arrival. CT head was unremarkable and patient was admitted for stroke workup. Neurology was called to evaluate the patient.    Past Surgical History:  Procedure Laterality Date  . HERNIA REPAIR      Family History  Problem Relation Age of Onset  . Dementia Mother    Social History:  reports that he has been smoking Cigarettes.  He has been smoking about 0.50 packs per day. He has never used smokeless tobacco. He reports that he drinks alcohol. He reports that he does not use drugs.  Allergies: No Known Allergies  Medications:                                                                                                                           Not on amy medications   ROS:  14 systems reviewed and negative    Examination:                                                                                                      General: Appears well-developed and well-nourished.  Psych: Affect appropriate to situation Eyes: No scleral injection HENT: No OP obstrucion Head: Normocephalic.  Cardiovascular: Normal rate and regular rhythm.  Respiratory: Effort normal and breath sounds normal to anterior ascultation GI: Soft.  No distension. There is no tenderness.  Skin: WDI   Neurological Examination Mental Status: Alert, oriented, thought content appropriate.  Speech fluent without evidence of aphasia.  Able to follow 3 step commands without difficulty. Cranial Nerves: II: Discs flat bilaterally; Visual fields grossly normal,  III,IV, VI: ptosis not present, extra-ocular motions intact bilaterally, pupils equal, round, reactive to light and accommodation V,VII: Right facial droop, facial light touch sensation normal bilaterally VIII: hearing normal bilaterally IX,X: uvula rises symmetrically XI: bilateral shoulder shrug XII: midline tongue extension Motor: Right : Upper extremity   5/5    Left:     Upper extremity   5/5  Lower extremity   5/5     Lower extremity   5/5 Tone and bulk:normal tone throughout; no atrophy noted Sensory: Reduced sensation to sharp, temperature light touch over bilateral feet. Absent vibration sense over great toe and right ankle, proprioception intact Deep Tendon Reflexes: 2+ and symmetric throughout, however diminished ankle reflexes bilaterally Plantars: Right: downgoing   Left: downgoing Cerebellar: normal finger-to-nose, normal rapid alternating movements and normal heel-to-shin test Gait: Mild gait imbalance, unable to tandem walk. Romberg's negative     Lab Results: Basic Metabolic Panel:  Recent Labs Lab 09/08/17 2008 09/08/17 2028  NA 135 138  K 3.8 3.6  CL 101 101  CO2 24  --   GLUCOSE 94 92  BUN 6 7  CREATININE 1.32* 1.20  CALCIUM 9.4  --      CBC:  Recent Labs Lab 09/08/17 2008 09/08/17 2028  WBC 5.5  --   NEUTROABS 3.4  --   HGB 13.7 16.3  HCT 42.2 48.0  MCV 98.8  --   PLT 262  --     Coagulation Studies:  Recent Labs  09/08/17 2008  LABPROT 13.1  INR 1.00    Imaging: Ct Head Wo Contrast  Result Date: 09/08/2017 CLINICAL DATA:  71 y/o M; 2 days of gait problems and right arm weakness for 1 day. EXAM: CT HEAD WITHOUT CONTRAST TECHNIQUE: Contiguous axial images were obtained from the base of the skull through the vertex without intravenous contrast. COMPARISON:  None. FINDINGS: Brain: No evidence of acute infarction, hemorrhage, hydrocephalus, extra-axial collection or mass lesion/mass effect. Nonspecific foci of hypoattenuation in subcortical and periventricular white matter are compatible with mild chronic microvascular ischemic changes and there is mild parenchymal volume loss of the brain for age. Vascular: Calcific atherosclerosis of carotid siphons. No hyperdense vessel identified. Skull: Normal. Negative for fracture or focal lesion. Sinuses/Orbits: No acute finding. Other: Opacification of external auditory canals, likely cerumen. IMPRESSION: 1. No acute intracranial abnormality  identified. If symptoms persist or if clinically indicated MRI is more sensitive for stroke. 2. Mild chronic microvascular ischemic changes and parenchymal volume loss of the brain for age. Electronically Signed   By: Mitzi Hansen M.D.   On: 09/08/2017 22:13     ASSESSMENT AND PLAN  71 y.o. male African-American right-handed with tobacco abuse and no known other vascular risk factors presents to the emergency room after 2 falls today.CT head was unremarkable. Patient is right facial droop on examination however no weakness was observed on assessment. He also states episode where a cordiform movements of his right arm. History and exam is concerning for a subacute stroke. Cordiform movements can be seen in the setting  carotid stenosis would be prudent to get vessel imaging.  Possible Stroke R  facial droop with forehead sparing    Recommend # MRI of the brain without contrast #MRA Head and neck  #Transthoracic Echo  # Start patient on ASA 325 mg.  #Start or continue Atorvastatin 80 mg/other high intensity statin # BP goal: permissive HTN upto 210 systolic, PRNs above 21 # HBAIC and Lipid profile # Telemetry monitoring # Frequent neuro checks # NPO until passes stroke swallow screen  Peripheral Neuropathy Maybe playing a role in gait imbalance, recurrent falls Recommend checking AIC, B12, MMA, TSH, Folate, Thiamine , LFTs  Please page stroke NP  Or  PA  Or MD from 8am -4 pm  as this patient from this time will be  followed by the stroke.   You can look them up on [email or web address]  Password [facility]   Nathan Perez [facility] Pager Number [phone]

## 2017-09-09 NOTE — Progress Notes (Signed)
STROKE TEAM PROGRESS NOTE   HISTORY OF PRESENT ILLNESS (per record) Nathan Perez is an 71 y.o. male African-American right-handed with a history of tobacco abuse and no known other vascular risk factors presents to the emergency room after 2 falls today.  The patient states that since a month he has been having gait imbalance and has repeated falls. He had 2 falls earlier on 09/08/2017 and states he feels weaker on the right side. On Monday he describes that his right arm was going 'crazy', he did not have control of his right arm was having choreiform movements lasting about one minute. Also noted in the emergency room that he had a right facial droop of which the patient himself was unaware.  Denies any other symptoms such as vision loss, slurred speech, difficulty with words, extremity weakness. Triage nurse noticed that he had a slightly weaker right hand grip. His blood pressure is 159/82 on arrival. CT head was unremarkable and patient was admitted for stroke workup. Neurology was called to evaluate the patient.  Patient was not administered IV t-PA secondary to arriving outside of the tPA treatment window. He was admitted to General Neurology for further evaluation and treatment.   SUBJECTIVE (INTERVAL HISTORY) No family is at the bedside.  The patient is alert, oriented, and follows all commands appropriately.  The pt endorses mild slurring of speech.  He did not feel any facial droop but on exam he does have right facial droop. He endorsed that yesterday he had right arm choreiform activity lasting 1 minute.   OBJECTIVE Temp:  [98 F (36.7 C)-98.2 F (36.8 C)] 98.1 F (36.7 C) (09/27 0609) Pulse Rate:  [90-105] 100 (09/27 0953) Cardiac Rhythm: Atrial flutter (09/27 0700) Resp:  [14-23] 14 (09/27 0609) BP: (141-163)/(73-111) 145/73 (09/27 0953) SpO2:  [95 %-100 %] 100 % (09/27 0953) Weight:  [67 kg (147 lb 12.8 oz)-68 kg (150 lb)] 67 kg (147 lb 12.8 oz) (09/27 0418)  CBC:    Recent Labs Lab 09/08/17 2008 09/08/17 2028  WBC 5.5  --   NEUTROABS 3.4  --   HGB 13.7 16.3  HCT 42.2 48.0  MCV 98.8  --   PLT 262  --     Basic Metabolic Panel:   Recent Labs Lab 09/08/17 2008 09/08/17 2028 09/09/17 0925  NA 135 138 136  K 3.8 3.6 3.7  CL 101 101 101  CO2 24  --  24  GLUCOSE 94 92 108*  BUN CREATININE 1.32* 1.20 1.16  CALCIUM 9.4  --  9.1    Lipid Panel:     Component Value Date/Time   CHOL 164 09/09/2017 0925   TRIG 60 09/09/2017 0925   HDL 64 09/09/2017 0925   CHOLHDL 2.6 09/09/2017 0925   VLDL 12 09/09/2017 0925   LDLCALC 88 09/09/2017 0925   HgbA1c: No results found for: HGBA1C Urine Drug Screen: No results found for: LABOPIA, COCAINSCRNUR, LABBENZ, AMPHETMU, THCU, LABBARB  Alcohol Level No results found for: ETH  IMAGING I have personally reviewed the radiological images below and agree with the radiology interpretations.  Ct Head Wo Contrast 09/08/2017 IMPRESSION: 1. No acute intracranial abnormality identified. If symptoms persist or if clinically indicated MRI is more sensitive for stroke. 2. Mild chronic microvascular ischemic changes and parenchymal volume loss of the brain for age.   DG Chest 2 View 09/09/2017 IMPRESSION: No active cardiopulmonary disease.  MRI Brain Wo Contrast MRA Brain Wo Contrast 09/09/2017 IMPRESSION: 1. Positive  for several small acute infarcts in the posterior left MCA territory, including involvement of the posterolateral left motor strip. 2. Questionable punctate acute or subacute lacunar infarct superimposed in the right cerebellum, but this may be artifact. 3. No associated hemorrhage or mass effect. 4. Underlying moderately advanced for age but nonspecific bilateral cerebral white matter T2 and FLAIR hyperintensity. 5. Normal noncontrast MRI appearance of the deep gray matter nuclei and brainstem. 6. Negative for age Neck MRA. 7. Negative for age intracranial MRA. Visible left MCA  branches are normal. 8. Incidental normal vascular anatomic variants including dominant right vertebral artery which supplies the basilar, non dominant the left arises near the aortic arch and terminates in the left PICA, and fetal type bilateral PCA origins.  TTE 09/09/2017 pending  TEE pending    PHYSICAL EXAM  Temp:  [97.5 F (36.4 C)-98.2 F (36.8 C)] 97.5 F (36.4 C) (09/27 1400) Pulse Rate:  [65-105] 65 (09/27 1400) Resp:  [14-23] 16 (09/27 1400) BP: (134-163)/(63-111) 148/63 (09/27 1400) SpO2:  [95 %-100 %] 100 % (09/27 1400) Weight:  [147 lb 12.8 oz (67 kg)-150 lb (68 kg)] 147 lb 12.8 oz (67 kg) (09/27 0418)  General - Well nourished, well developed, in no apparent distress.  Ophthalmologic - Fundi not visualized due to noncooperation.  Cardiovascular - Regular rate and rhythm.  Mental Status -  Level of arousal and orientation to time, place, and person were intact. Language including expression, naming, repetition, comprehension was assessed and found intact. Fund of Knowledge was assessed and was intact.  Cranial Nerves II - XII - II - Visual field intact OU. III, IV, VI - Extraocular movements intact. V - Facial sensation intact bilaterally. VII - mild right facial droop. VIII - Hearing & vestibular intact bilaterally. X - Palate elevates symmetrically, mild dysarthria. XI - Chin turning & shoulder shrug intact bilaterally. XII - Tongue protrusion intact.  Motor Strength - The patient's strength was normal in all extremities and pronator drift was absent.  Bulk was normal and fasciculations were absent.   Motor Tone - Muscle tone was assessed at the neck and appendages and was normal.  Reflexes - The patient's reflexes were 1+ in all extremities and he had no pathological reflexes.  Sensory - Light touch, temperature/pinprick were assessed and were symmetrical.    Coordination - The patient had normal movements in the hands and feet with no ataxia or  dysmetria.  Tremor was absent.  Gait and Station - deferred.   ASSESSMENT/PLAN [name] is a 71 y.o. male with history of tobacco abuse and no known other vascular risk factors presents to the emergency room after 2 falls today. He did not receive IV t-PA due to arriving outside of the tPA treatment window.   Stroke: 3 x punctate L MCA territory and 1 x right cerebellum acute infarcts, likely cardioembolic, of undetermined etiology   Resultant  mild right facial droop and dysarthria  CT head: no acute stroke  MRI head: Multiple small L MCA territory acute infarcts including the posterolateral left motor strip and right cerebellum.  MRA head and neck: no LVO or high-grade stenosis  2D Echo: pending  Recommend TEE and loop recorder to evaluate cardioembolic source  LDL 88  HgbA1c 5.1  Lovenox 40 mg sq daily for VTE prophylaxis DIET DYS 3 Room service appropriate? Yes; Fluid consistency: Thin  No antithrombotic prior to admission, now on aspirin 325 mg daily.   Patient counseled to be compliant with his  antithrombotic medications  Ongoing aggressive stroke risk factor management  Therapy recommendations: None  Disposition: Pending  Hypertension  Stable  Permissive hypertension (OK if < 220/120) but gradually normalize in 5-7 days  Long-term BP goal normotensive  Hyperlipidemia  Home meds:  none  LDL 88, goal < 70  Add atorvastatin 20 mg PO daily  Continue statin at discharge  Tobacco abuse  Current smoker  Smoking cessation counseling provided  Pt is willing to quit  Other Stroke Risk Factors  Advanced age  ETOH use, advised to drink no more than 2 drink(s) a day  Other Active Problems  None  Hospital day # 0  Marvel Plan, MD PhD Stroke Neurology 09/09/2017 4:32 PM    To contact Stroke Continuity provider, please refer to [email or web address]. After hours, contact General Neurology

## 2017-09-09 NOTE — Progress Notes (Signed)
  Speech Language Pathology Treatment: Cognitive-Linquistic  Patient Details Name: Nathan Perez MRN: [id number] DOB: [birth year] Today's Date: 09/09/2017 Time: 6578-4696 SLP Time Calculation (min) (ACUTE ONLY): 15 min  Assessment / Plan / Recommendation Clinical Impression  Skilled treatment session focused on speech intelligibility goals. SLP facilitated session by providing Min A to use speech intelligibility strategies of increased volume and overarticulation to achieve ~80% intelligibility at the sentence level. Pt able to return demonstrate increased volume but would benefit from skilled ST to increase use of speech intelligibility strategies. Of note, pt appears to be introverted and quiet at baseline.    HPI HPI: Pt is a 71 y.o.M with history of tobacco use who presents to the emergency department with complaints of 2 falls today. He reports over the past month he has felt "off balance". He states since Monday, September 24 he has had right upper extremity weakness where he dropped something that he was holding. He then had an episode where he felt like he did not have any control over his arm and there are weird spontaneous movements. He was alert and conscious during this episode. No numbness or tingling. No headache. Denies any recent head injury. He is not on antiplatelets or anticoagulants. Denies history of stroke. CT didn't reveal any acute intracranial abnormality but did reveal mild chronic microvascular ischemic changes and parenchymal volum loss of the brain for age. Pt passed stroke swallow screen.       SLP Plan  Continue with current plan of care  Patient needs continued Speech Lanaguage Pathology Services    Recommendations  Diet recommendations: Dysphagia 3 (mechanical soft) (Per MD)                Follow up Recommendations: None SLP Visit Diagnosis: Dysarthria and anarthria (R47.1) Plan: Continue with current plan of care       GO                Zeph Riebel 09/09/2017, 9:33 AM

## 2017-09-09 NOTE — Progress Notes (Signed)
Skin assessment done by this writer, on assessment no pressure ulcer noted. Lab result received for troponin level(result 0.03). Page sent to doctor on call, will continue monitor.

## 2017-09-09 NOTE — ED Provider Notes (Signed)
TIME SEEN: 1:42 AM  CHIEF COMPLAINT: 2 falls today, right-sided arm weakness, right-sided facial droop  HPI: Pt is a 71 y.o. M with history of tobacco use who presents to the emergency department with complaints of 2 falls today. He reports over the past month he has felt "off balance". He states since Monday, September 24 he has had right upper extremity weakness where he dropped something that he was holding. He then had an episode where he felt like he did not have any control over his arm and there are weird spontaneous movements. He was alert and conscious during this episode. No numbness or tingling. No headache. Denies any recent head injury. He is not on antiplatelets or anticoagulants. Denies history of stroke. PCP is Dr. Kathe Mariner.  ROS: See HPI Constitutional: no fever  Eyes: no drainage  ENT: no runny nose   Cardiovascular:  no chest pain  Resp: no SOB  GI: no vomiting GU: no dysuria Integumentary: no rash  Allergy: no hives  Musculoskeletal: no leg swelling  Neurological: no slurred speech ROS otherwise negative  PAST MEDICAL HISTORY/PAST SURGICAL HISTORY:  History reviewed. No pertinent past medical history.  MEDICATIONS:  Prior to Admission medications   Medication Sig Start Date End Date Taking? Authorizing Provider  ciprofloxacin (CIPRO) 500 MG tablet Take 1 tablet (500 mg total) by mouth 2 (two) times daily. One po bid x 7 days 04/28/13   Donnetta Hutching, MD  PRESCRIPTION MEDICATION Take 1 tablet by mouth 3 (three) times daily as needed (PAIN medication).    [provider]    ALLERGIES:  No Known Allergies  SOCIAL HISTORY:  Social History  Substance Use Topics  . Smoking status: Current Every Day Smoker    Packs/day: 0.50    Types: Cigarettes  . Smokeless tobacco: Never Used  . Alcohol use Yes     Comment: occasionally     FAMILY HISTORY: No family history on file.  EXAM: BP (!) 162/107 (BP Location: Right Arm)   Pulse 97   Temp 98 F  (36.7 C) (Oral)   Resp 18   Ht  (1.854 m)   Wt 68 kg (150 lb)   SpO2 98%   BMI 19.79 kg/m  CONSTITUTIONAL: Alert and oriented and responds appropriately to questions. Chronically ill-appearing, elderly HEAD: Normocephalic EYES: Conjunctivae clear, pupils appear equal, EOMI ENT: normal nose; moist mucous membranes NECK: Supple, no meningismus, no nuchal rigidity, no LAD  CARD: RRR; S1 and S2 appreciated; no murmurs, no clicks, no rubs, no gallops RESP: Normal chest excursion without splinting or tachypnea; breath sounds clear and equal bilaterally; no wheezes, no rhonchi, no rales, no hypoxia or respiratory distress, speaking full sentences ABD/GI: Normal bowel sounds; non-distended; soft, non-tender, no rebound, no guarding, no peritoneal signs, no hepatosplenomegaly BACK:  The back appears normal and is non-tender to palpation, there is no CVA tenderness EXT: Normal ROM in all joints; non-tender to palpation; no edema; normal capillary refill; no cyanosis, no calf tenderness or swelling    SKIN: Normal color for age and race; warm; no rash NEURO: Moves all extremities equally, decreased strength in the right upper extremity compared to the left, normal strength in the bilateral lower extreme is, normal sensation diffusely, no dysmetria to finger-nose testing bilaterally, mild right facial droop, normal speech, otherwise cranial nerves II through XII intact PSYCH: The patient's mood and manner are appropriate. Grooming and personal hygiene are appropriate.  MEDICAL DECISION MAKING:  2:45 AM  Pt here with  new right upper extremity weakness and facial droop that started on September 24. I'm concerned for possible stroke. Discussed with neurologist Dr. Laurence Slate who will see patient consult. He feels he can see a hypodensity in the left parietal region on head CT.Patient and family comfortable with this plan. We'll give full dose aspirin. Labs otherwise unremarkable.  3:03 AM Discussed  patient's case with hospitalist, Dr. Selena Batten.  I have recommended admission and patient (and family if present) agree with this plan. Admitting physician will place admission orders.   I reviewed all nursing notes, vitals, pertinent previous records, EKGs, lab and urine results, imaging (as available).     EKG Interpretation  Date/Time:  Wednesday September 08 2017 19:57:31 EDT Ventricular Rate:  105 PR Interval:  152 QRS Duration: 112 QT Interval:  334 QTC Calculation: 441 R Axis:   -8 Text Interpretation:  Sinus tachycardia with Premature supraventricular complexes ST & T wave abnormality, consider inferolateral ischemia Abnormal ECG No old tracing to compare Confirmed by Zain Lankford, Baxter Hire [id number]) on 09/09/2017 1:42:45 AM         Cloee Dunwoody, Layla Maw, DO 09/09/17 1914

## 2017-09-09 NOTE — Progress Notes (Addendum)
Occupational Therapy Evaluation Patient Details Name: Nathan Perez MRN: [id number] DOB: 06/28/1946 Today's Date: 09/09/2017    History of Present Illness 71 y.o. male, smoker with c/o fall outside earlier today at about 5;30pm,  Denies dizziness, syncope, cp, palp, sob, n/v, diarrhea, brbpr.  Went inside today and fell in his room. CT-. MRI pending.   Clinical Impression   PTA, pt lived in a "rooming house" and was independent with ADL, mobility, drove and mowed yards part-time. Pt mobilizing with S and overall modified independent with ADL. Pt with mild incoordination and R hand weakness. Will follow up tomorrow with HEP and education on safety/reducing risk of falls. Discussed with PT, including need to further assess higher level balance.      Follow Up Recommendations  No OT follow up;Supervision - Intermittent    Equipment Recommendations  None recommended by OT    Recommendations for Other Services       Precautions / Restrictions Precautions Precautions: Fall Restrictions Weight Bearing Restrictions: No      Mobility Bed Mobility Overal bed mobility: Modified Independent                Transfers Overall transfer level: Needs assistance   Transfers: Sit to/from Stand;Stand Pivot Transfers Sit to Stand: Supervision Stand pivot transfers: Supervision            Balance Overall balance assessment: Needs assistance   Sitting balance-Leahy Scale: Good       Standing balance-Leahy Scale: Fair                             ADL either performed or assessed with clinical judgement   ADL    Overall modified independent although pt states he has trouble "eating". States he is "hungry, but gets full very quickly" 20+ lb weight loss recently                                           Vision Baseline Vision/History: Wears glasses Wears Glasses: At all times Patient Visual Report: No change from baseline Vision Assessment?:  Yes Eye Alignment: Within Functional Limits Ocular Range of Motion: Within Functional Limits Alignment/Gaze Preference: Within Defined Limits Tracking/Visual Pursuits: Able to track stimulus in all quads without difficulty Saccades: Within functional limits Convergence: Within functional limits Visual Fields: No apparent deficits     Perception     Praxis Praxis Praxis tested?: Within functional limits    Pertinent Vitals/Pain Pain Assessment: Faces Faces Pain Scale: Hurts a little bit Pain Location: R hand Pain Descriptors / Indicators: Discomfort Pain Intervention(s): Limited activity within patient's tolerance     Hand Dominance Left   Extremity/Trunk Assessment Upper Extremity Assessment Upper Extremity Assessment: RUE deficits/detail RUE Deficits / Details: Weak grip and pinch stength - @ 3+/5. Mild decrease in fine motor skills RUE Coordination: decreased fine motor   Lower Extremity Assessment Lower Extremity Assessment: Defer to PT evaluation   Cervical / Trunk Assessment Cervical / Trunk Assessment: Normal   Communication Communication Communication: No difficulties   Cognition Arousal/Alertness: Awake/alert Behavior During Therapy: WFL for tasks assessed/performed Overall Cognitive Status: Within Functional Limits for tasks assessed  General Comments   Pt staets he is "much better" than when he arrived. "My arm was going crazy"    Exercises     Shoulder Instructions      Home Living Family/patient expects to be discharged to:: Private residence Living Arrangements: Other (Comment) (rooming house) Available Help at Discharge: Family Type of Home: Other(Comment) Home Access: Stairs to enter [employer] of Steps: 3 Entrance Stairs-Rails: Right;Left Home Layout: Two level;Able to live on main level with bedroom/bathroom     Bathroom Shower/Tub: Producer, television/film/video:  Standard Bathroom Accessibility: Yes How Accessible: Accessible via walker Home Equipment: Walker - 2 wheels;Cane - single point;Bedside commode;Crutches;Shower seat;Other (comment) (Hoyer lift)   Additional Comments: Lives in "rooming house" butcan stay with wife if needed, who has a house  Lives With: Friend(s) (Does not live with wife, but they have a good relationship)    Prior Functioning/Environment Level of Independence: Independent                 OT Problem List: Decreased strength;Impaired balance (sitting and/or standing);Decreased coordination      OT Treatment/Interventions: Self-care/ADL training;DME and/or AE instruction;Therapeutic activities;Patient/family education;Balance training    OT Goals(Current goals can be found in the care plan section) Acute Rehab OT Goals Patient Stated Goal: to go home OT Goal Formulation: With patient Time For Goal Achievement: 09/23/17 Potential to Achieve Goals: Good  OT Frequency: Min 2X/week   Barriers to D/C:            Co-evaluation              AM-PAC PT "6 Clicks" Daily Activity     Outcome Measure Help from another person eating meals?: None Help from another person taking care of personal grooming?: None Help from another person toileting, which includes using toliet, bedpan, or urinal?: None Help from another person bathing (including washing, rinsing, drying)?: None Help from another person to put on and taking off regular upper body clothing?: None Help from another person to put on and taking off regular lower body clothing?: None 6 Click Score: 24   End of Session Equipment Utilized During Treatment: Gait belt Nurse Communication: Mobility status  Activity Tolerance: Patient tolerated treatment well Patient left: with call bell/phone within reach;with bed alarm set;with family/visitor present;in bed (Going to MRI)  OT Visit Diagnosis: Unsteadiness on feet (R26.81);Muscle weakness (generalized)  (M62.81)                Time: 1610-9604 OT Time Calculation (min): 34 min Charges:  OT General Charges $OT Visit: 1 Visit OT Evaluation $OT Eval Moderate Complexity: 1 Mod OT Treatments $Self Care/Home Management : 8-22 mins G-Codes:     [facility], OT/L  [phone] 09/09/2017  Carinna Newhart,HILLARY 09/09/2017, 11:13 AM

## 2017-09-09 NOTE — H&P (Signed)
TRH H&P   Patient Demographics:    Nathan Perez, is a 71 y.o. male  MRN: [id number]   DOB - 1946/01/26  Admit Date - 09/09/2017  Outpatient Primary MD for the patient is Alwyn Ren Titus Dubin, MD Billee Cashing  Referring MD/NP/PA: Zettie Cooley  Outpatient Specialists:   Patient coming from: home  Chief Complaint  Patient presents with  . Weakness      HPI:    Nathan Perez  is a 72 y.o. male, smoker with c/o fall outside earlier today at about 5;30pm,  Denies dizziness, syncope, cp, palp, sob, n/v, diarrhea, brbpr.  Went inside today and fell in his room.  Pt is not sure why he fell,  Might have turned too fast and gotten off balance per the patient.   Pt was brought to ED by his family.   In ED, CT brain => IMPRESSION: 1. No acute intracranial abnormality identified. If symptoms persist or if clinically indicated MRI is more sensitive for stroke. 2. Mild chronic microvascular ischemic changes and parenchymal volume loss of the brain for age.  Telemetry showed Atrial flutter at 85-108.  Pt asymptomatic other than fatigue. Trop 0.02.  Bun/creat  6/1.32  Pt will be admitted for fall x2,  Aflutter r/o CVA.      Review of systems:    In addition to the HPI above,  No Fever-chills, No Headache, No changes with Vision or hearing, No problems swallowing food or Liquids, No Chest pain, Cough or Shortness of Breath, No Abdominal pain, No Nausea or Vommitting, Bowel movements are regular, No Blood in stool or Urine, No dysuria, No new skin rashes or bruises, No new joints pains-aches,  Slight right arm weakness ??? No recent weight gain or loss, No polyuria, polydypsia or polyphagia, No significant Mental Stressors.  A full 10 point Review of Systems was done, except as stated above, all other Review of Systems were negative.   With Past History of the following :     History reviewed. No pertinent past medical history.    Past Surgical History:  Procedure Laterality Date  . HERNIA REPAIR        Social History:     Social History  Substance Use Topics  . Smoking status: Current Every Day Smoker    Packs/day: 0.50    Types: Cigarettes  . Smokeless tobacco: Never Used  . Alcohol use Yes     Comment: occasionally      Lives - at home  Mobility - walks by self  Family History :     Family History  Problem Relation Age of Onset  . Dementia Mother       Home Medications:   Prior to Admission medications   Medication Sig Start Date End Date Taking? Authorizing Provider  ciprofloxacin (CIPRO) 500 MG tablet Take 1 tablet (500 mg total) by mouth 2 (two) times  daily. One po bid x 7 days 04/28/13   Donnetta Hutching, MD  PRESCRIPTION MEDICATION Take 1 tablet by mouth 3 (three) times daily as needed (PAIN medication).    [provider]     Allergies:    No Known Allergies   Physical Exam:   Vitals  Blood pressure (!) 146/94, pulse 95, temperature 98.1 F (36.7 C), resp. rate (!) 23, height  (1.854 m), weight 68 kg (150 lb), SpO2 100 %.   1. General  lying in bed in NAD,    2. Normal affect and insight, Not Suicidal or Homicidal, Awake Alert, Oriented X 3.  3. No F.N deficits, ALL C.Nerves Intact, Strength 5/5 all 4 extremities except for below, Sensation intact all 4 extremities, Plantars down going on the left  4. Ears and Eyes appear Normal, Conjunctivae clear, PERRLA. Moist Oral Mucosa.  5. Supple Neck, No JVD, No cervical lymphadenopathy appriciated, No Carotid Bruits.  6. Symmetrical Chest wall movement, Good air movement bilaterally, CTAB.  7. RRR, No Gallops, Rubs or Murmurs, No Parasternal Heave.  8. Positive Bowel Sounds, Abdomen Soft, No tenderness, No organomegaly appriciated,No rebound -guarding or rigidity.  9.  No Cyanosis, Normal Skin Turgor, No Skin Rash or Bruise.  10. Good muscle tone,   joints appear normal , no effusions, Normal ROM.  11. No Palpable Lymph Nodes in Neck or Axillae  Slight decrease in right hand grip 5-/5,  upgoing toe on the right  Slight right facial droop   Data Review:    CBC  Recent Labs Lab 09/08/17 2008 09/08/17 2028  WBC 5.5  --   HGB 13.7 16.3  HCT 42.2 48.0  PLT 262  --   MCV 98.8  --   MCH 32.1  --   MCHC 32.5  --   RDW 14.5  --   LYMPHSABS 1.6  --   MONOABS 0.5  --   EOSABS 0.1  --   BASOSABS 0.0  --    ------------------------------------------------------------------------------------------------------------------  Chemistries   Recent Labs Lab 09/08/17 2008 09/08/17 2028  NA 135 138  K 3.8 3.6  CL 101 101  CO2 24  --   GLUCOSE 94 92  BUN 6 7  CREATININE 1.32* 1.20  CALCIUM 9.4  --   AST 23  --   ALT 14*  --   ALKPHOS 63  --   BILITOT 0.8  --    ------------------------------------------------------------------------------------------------------------------ estimated creatinine clearance is 53.5 mL/min (by C-G formula based on SCr of 1.2 mg/dL). ------------------------------------------------------------------------------------------------------------------ No results for input(s): TSH, T4TOTAL, T3FREE, THYROIDAB in the last 72 hours.  Invalid input(s): FREET3  Coagulation profile  Recent Labs Lab 09/08/17 2008  INR 1.00   ------------------------------------------------------------------------------------------------------------------- No results for input(s): DDIMER in the last 72 hours. -------------------------------------------------------------------------------------------------------------------  Cardiac Enzymes No results for input(s): CKMB, TROPONINI, MYOGLOBIN in the last 168 hours.  Invalid input(s): CK ------------------------------------------------------------------------------------------------------------------ No results found for:  BNP   ---------------------------------------------------------------------------------------------------------------  Urinalysis    Component Value Date/Time   COLORURINE STRAW (A) 09/08/2017 2045   APPEARANCEUR CLEAR 09/08/2017 2045   LABSPEC 1.004 (L) 09/08/2017 2045   PHURINE 5.0 09/08/2017 2045   GLUCOSEU NEGATIVE 09/08/2017 2045   HGBUR LARGE (A) 09/08/2017 2045   BILIRUBINUR NEGATIVE 09/08/2017 2045   KETONESUR NEGATIVE 09/08/2017 2045   PROTEINUR NEGATIVE 09/08/2017 2045   UROBILINOGEN 1.0 04/28/2013 1205   NITRITE NEGATIVE 09/08/2017 2045   LEUKOCYTESUR NEGATIVE 09/08/2017 2045    ----------------------------------------------------------------------------------------------------------------   Imaging Results:  Ct Head Wo Contrast  Result Date: 09/08/2017 CLINICAL DATA:  71 y/o M; 2 days of gait problems and right arm weakness for 1 day. EXAM: CT HEAD WITHOUT CONTRAST TECHNIQUE: Contiguous axial images were obtained from the base of the skull through the vertex without intravenous contrast. COMPARISON:  None. FINDINGS: Brain: No evidence of acute infarction, hemorrhage, hydrocephalus, extra-axial collection or mass lesion/mass effect. Nonspecific foci of hypoattenuation in subcortical and periventricular white matter are compatible with mild chronic microvascular ischemic changes and there is mild parenchymal volume loss of the brain for age. Vascular: Calcific atherosclerosis of carotid siphons. No hyperdense vessel identified. Skull: Normal. Negative for fracture or focal lesion. Sinuses/Orbits: No acute finding. Other: Opacification of external auditory canals, likely cerumen. IMPRESSION: 1. No acute intracranial abnormality identified. If symptoms persist or if clinically indicated MRI is more sensitive for stroke. 2. Mild chronic microvascular ischemic changes and parenchymal volume loss of the brain for age. Electronically Signed   By: Mitzi Hansen M.D.   On:  09/08/2017 22:13      Assessment & Plan:    Principal Problem:   Atrial flutter (HCC) Active Problems:   Hypertension    Fallx2 R hand weakness r/o CVA Check MRI brain  Carotid ultrasound Cardiac echo Hga1c, lipid Aspirin, lipitor Permissive hypertension Neurology consult, appreciate input.   Atrial flutter Tele Trop I q6h x3 Check tsh Consider anticoagulation   DVT Prophylaxis SCDs   AM Labs Ordered, also please review Full Orders  Family Communication: Admission, patients condition and plan of care including tests being ordered have been discussed with the patient  who indicate understanding and agree with the plan and Code Status.  Code Status FULL CODE  Likely DC to  home  Condition GUARDED    Consults called: neurology by ED  Admission status: inpatient  Time spent in minutes : 45   Pearson Grippe M.D on 09/09/2017 at 3:02 AM  Between 7am to 7pm - Pager - [phone] . After 7pm go to [email or web address] - password [facility]  [facility] - Office  [phone]

## 2017-09-09 NOTE — Progress Notes (Signed)
Initial Nutrition Assessment  DOCUMENTATION CODES:   Not applicable  INTERVENTION:   -Ensure Enlive po TID, each supplement provides 350 kcal and 20 grams of protein  NUTRITION DIAGNOSIS:   Inadequate oral intake related to poor appetite as evidenced by meal completion < 50%.  GOAL:   Patient will meet greater than or equal to 90% of their needs  MONITOR:   PO intake, Supplement acceptance, Labs, Weight trends, Skin, I & O's  REASON FOR ASSESSMENT:   Malnutrition Screening Tool    ASSESSMENT:   Nathan Perez  is a 71 y.o. male, smoker with c/o fall outside earlier today at about 5;30pm,  Denies dizziness, syncope, cp, palp, sob, n/v, diarrhea, brbpr.  Went inside today and fell in his room.  Pt is not sure why he fell,  Might have turned too fast and gotten off balance per the patient.   Pt admitted with a-flutter and rule out CVA s/p fall.   Pt unavailable at times of visits. No family at bedside to provide additional hx. Unable to complete Nutrition-Focused physical exam at this time.   Reviewed wt hx; no recent wt readings available to assess wt changes.   Observed both breakfast and lunch trays in room. Lunch tray unattempted and pt consumed only fruit juice off breakfast tray. Documented meal completion 30%.   Given poor oral intake, pt will benefit from addition of nutritional supplements. RD to order.   Labs reviewed.   Diet Order:  DIET DYS 3 Room service appropriate? Yes; Fluid consistency: Thin  Skin:  Reviewed, no issues  Last BM:  09/09/17  Height:   Ht Readings from Last 1 Encounters:  09/09/17  (1.854 m)    Weight:   Wt Readings from Last 1 Encounters:  09/09/17 147 lb 12.8 oz (67 kg)    Ideal Body Weight:  83.6 kg  BMI:  Body mass index is 19.5 kg/m.  Estimated Nutritional Needs:   Kcal:  1800-2000  Protein:  90-105 grams  Fluid:  1.8-2.0 L  EDUCATION NEEDS:   Education needs no appropriate at this time  Jonasia Coiner A.  Mayford Knife, RD, LDN, CDE Pager: [phone] After hours Pager: [phone]

## 2017-09-10 ENCOUNTER — Inpatient Hospital Stay (HOSPITAL_BASED_OUTPATIENT_CLINIC_OR_DEPARTMENT_OTHER): Payer: Medicare PPO

## 2017-09-10 DIAGNOSIS — I639 Cerebral infarction, unspecified: Secondary | ICD-10-CM

## 2017-09-10 DIAGNOSIS — E785 Hyperlipidemia, unspecified: Secondary | ICD-10-CM | POA: Diagnosis not present

## 2017-09-10 DIAGNOSIS — I483 Typical atrial flutter: Secondary | ICD-10-CM | POA: Diagnosis not present

## 2017-09-10 DIAGNOSIS — I10 Essential (primary) hypertension: Secondary | ICD-10-CM | POA: Diagnosis not present

## 2017-09-10 DIAGNOSIS — R1310 Dysphagia, unspecified: Secondary | ICD-10-CM

## 2017-09-10 DIAGNOSIS — R29898 Other symptoms and signs involving the musculoskeletal system: Secondary | ICD-10-CM | POA: Diagnosis not present

## 2017-09-10 DIAGNOSIS — I6789 Other cerebrovascular disease: Secondary | ICD-10-CM | POA: Diagnosis not present

## 2017-09-10 DIAGNOSIS — R29810 Facial weakness: Secondary | ICD-10-CM | POA: Diagnosis not present

## 2017-09-10 DIAGNOSIS — W19XXXA Unspecified fall, initial encounter: Secondary | ICD-10-CM

## 2017-09-10 DIAGNOSIS — I5022 Chronic systolic (congestive) heart failure: Secondary | ICD-10-CM

## 2017-09-10 DIAGNOSIS — R296 Repeated falls: Secondary | ICD-10-CM | POA: Diagnosis not present

## 2017-09-10 LAB — CBC
HCT: 40.5 % (ref 39.0–52.0)
HEMOGLOBIN: 13.6 g/dL (ref 13.0–17.0)
MCH: 33 pg (ref 26.0–34.0)
MCHC: 33.6 g/dL (ref 30.0–36.0)
MCV: 98.3 fL (ref 78.0–100.0)
Platelets: 263 10*3/uL (ref 150–400)
RBC: 4.12 MIL/uL — ABNORMAL LOW (ref 4.22–5.81)
RDW: 14 % (ref 11.5–15.5)
WBC: 4.7 10*3/uL (ref 4.0–10.5)

## 2017-09-10 LAB — BASIC METABOLIC PANEL
Anion gap: 6 (ref 5–15)
BUN: 9 mg/dL (ref 6–20)
CALCIUM: 9.1 mg/dL (ref 8.9–10.3)
CO2: 28 mmol/L (ref 22–32)
CREATININE: 1.2 mg/dL (ref 0.61–1.24)
Chloride: 102 mmol/L (ref 101–111)
GFR calc non Af Amer: 59 mL/min — ABNORMAL LOW (ref >60)
Glucose, Bld: 95 mg/dL (ref 65–99)
Potassium: 4 mmol/L (ref 3.5–5.1)
SODIUM: 136 mmol/L (ref 135–145)

## 2017-09-10 LAB — ECHOCARDIOGRAM COMPLETE
HEIGHTINCHES: 73 in
Weight: 2364.8 oz

## 2017-09-10 MED ORDER — UNABLE TO FIND: 0 refills | Status: DC

## 2017-09-10 MED ORDER — ASPIRIN 325 MG PO TABS: 325.0000 mg | ORAL_TABLET | Freq: Every day | ORAL | 4 refills | Status: DC

## 2017-09-10 MED ORDER — ATORVASTATIN CALCIUM 20 MG PO TABS
20.0000 mg | ORAL_TABLET | Freq: Every day | ORAL | 4 refills | Status: DC
Start: 2017-09-10 — End: 2017-09-13

## 2017-09-10 MED ORDER — HEPARIN (PORCINE) IN NACL 100-0.45 UNIT/ML-% IJ SOLN
750.0000 [IU]/h | INTRAMUSCULAR | Status: DC
  Administered 2017-09-10: 950 [IU]/h via INTRAVENOUS
  Administered 2017-09-11: 750 [IU]/h via INTRAVENOUS
  Filled 2017-09-10 (×3): qty 250

## 2017-09-10 MED ORDER — METOPROLOL SUCCINATE ER 25 MG PO TB24
25.0000 mg | ORAL_TABLET | Freq: Every day | ORAL | Status: DC
  Administered 2017-09-10 – 2017-09-13 (×4): 25 mg via ORAL
  Filled 2017-09-10 (×4): qty 1

## 2017-09-10 MED ORDER — ASPIRIN EC 81 MG PO TBEC
81.0000 mg | DELAYED_RELEASE_TABLET | Freq: Every day | ORAL | Status: DC
  Administered 2017-09-11 – 2017-09-13 (×3): 81 mg via ORAL
  Filled 2017-09-10 (×3): qty 1

## 2017-09-10 NOTE — Progress Notes (Signed)
Occupational Therapy Treatment Patient Details Name: Nathan Perez MRN: [id number] DOB: 02-09-1946 Today's Date: 09/10/2017    History of present illness Pt is a 71 y.o. male, smoker with c/o fall outside earlier today at about 5;30pm,  Denies dizziness, syncope, cp, palp, sob, n/v, diarrhea, brbpr.  Went inside today and fell in his room. CT-. MRI pending.   OT comments  Pt making steady progress. Educated on strategies to reduce risk of falls at home and completed education regarding RUE HEP for strengthening and fine motor/coordination. OT goals met. Discussed recommendation for pt to DC home with his wife until he returns to baseline. Pt verbalized understanding. OT signing off. Pt safe to DC home with intermittent S when medically stable.   Follow Up Recommendations  No OT follow up;Supervision - Intermittent    Equipment Recommendations  None recommended by OT    Recommendations for Other Services      Precautions / Restrictions Precautions Precautions: Fall Restrictions Weight Bearing Restrictions: No       Mobility Bed Mobility Overal bed mobility: Modified Independent                Transfers Overall transfer level: Needs assistance Equipment used: None Transfers: Sit to/from Stand;Stand Pivot Transfers Sit to Stand: Modified independent (Device/Increase time) Stand pivot transfers: Supervision       General transfer comment: supervision for safety    Balance Overall balance assessment: Needs assistance Sitting-balance support: Feet supported Sitting balance-Leahy Scale: Good     Standing balance support: During functional activity;No upper extremity supported Standing balance-Leahy Scale: Good               High level balance activites: Side stepping;Backward walking;Direction changes;Other (comment) (tandem stance, marching, circling around obstacles) High Level Balance Comments: pt with great difficulty achieving and sustaining tandem stance  without UE supports, required min A; supervision for backwards walking and side stepping.           ADL either performed or assessed with clinical judgement   ADL Overall ADL's : Needs assistance/impaired                                     Functional mobility during ADLs: Supervision/safety General ADL Comments: Overall S for ADL     Vision       Perception     Praxis      Cognition Arousal/Alertness: Awake/alert Behavior During Therapy: WFL for tasks assessed/performed Overall Cognitive Status: Within Functional Limits for tasks assessed                                          Exercises  Educated on fine motor/coordination ex. Written handout reviewed. Educated on theraputty level 2 ex for R hand strengthening   Shoulder Instructions       General Comments      Pertinent Vitals/ Pain       Pain Assessment: 0-10 Pain Score: 2  Pain Location: R hand Pain Descriptors / Indicators: Discomfort Pain Intervention(s): Limited activity within patient's tolerance  Home Living                                          Prior Functioning/Environment  Frequency           Progress Toward Goals  OT Goals(current goals can now be found in the care plan section)  Progress towards OT goals: Goals met/education completed, patient discharged from OT  Acute Rehab OT Goals Patient Stated Goal: to go home OT Goal Formulation: With patient Time For Goal Achievement: 09/23/17 Potential to Achieve Goals: Good ADL Goals Pt/caregiver will Perform Home Exercise Program: Right Upper extremity;With written HEP provided;Independently Additional ADL Goal #1: Pt/family will independently verbalize 3 strtegies to reduce risk of falls.  Plan All goals met and education completed, patient discharged from OT services    Co-evaluation                 AM-PAC PT "6 Clicks" Daily Activity     Outcome  Measure   Help from another person eating meals?: None Help from another person taking care of personal grooming?: None Help from another person toileting, which includes using toliet, bedpan, or urinal?: None Help from another person bathing (including washing, rinsing, drying)?: None Help from another person to put on and taking off regular upper body clothing?: None Help from another person to put on and taking off regular lower body clothing?: None 6 Click Score: 24    End of Session    OT Visit Diagnosis: Unsteadiness on feet (R26.81);Muscle weakness (generalized) (M62.81)   Activity Tolerance Patient tolerated treatment well   Patient Left in bed;with call bell/phone within reach   Nurse Communication Mobility status        Time: 7116-5790 OT Time Calculation (min): 14 min  Charges: OT General Charges $OT Visit: 1 Visit OT Treatments $Therapeutic Activity: 8-22 mins  [facility], OT/L  [phone] 09/10/2017   Valente Fosberg,HILLARY 09/10/2017, 12:06 PM

## 2017-09-10 NOTE — Care Management CC44 (Signed)
Condition Code 44 Documentation Completed  Patient Details  Name: TAHA DIMOND MRN: [id number] Date of Birth: 07-05-1946   Condition Code 44 given:  Yes Patient signature on Condition Code 44 notice:  Yes Documentation of 2 MD's agreement:  Yes Code 44 added to claim:  Yes    Kermit Balo, RN 09/10/2017, 3:01 PM

## 2017-09-10 NOTE — Progress Notes (Signed)
[facility]                                                                              Patient Demographics  Nathan Perez, is a 71 y.o. male, DOB - 09-Mar-1946, JYN:[id number]  Admit date - 09/09/2017   Admitting Physician Pearson Grippe, MD  Outpatient Primary MD for the patient is Pecola Lawless, MD  Outpatient specialists:   LOS - 1  days   Medical records reviewed and are as summarized below:    Chief Complaint  Patient presents with  . Weakness       Brief summary   Patient is a 71 year old male who had a fall on the day of admission at about 5:30 PM. Subsequently went inside and fell in his room again. Patient was brought to the ED by his family. Initial CT head was negative, telemetry showed atrial flutter 85-108. Patient was admitted due to the concern for CVA   Assessment & Plan    Principal Problem:   Acute CVA (cerebrovascular accident) (HCC)presenting with falls - MRI of the brain showed positive several small acute infarcts in the posterior left MCA territory including involvement of the posterior lateral left motor strip, question about punctate acute or subacute lacunar infarct superimposed in the right cerebellum, may be artifact. No associated hemorrhage or mass effect. - Negative intracranial MRA head and neck - placed on aspirin 325 mg daily, Lipitor - echo showed EF 25-30% with diffuse hypokinesis, no prior cardiology evaluation. D/w Neuro, Dr Roda Shutters, recommended cardiology eval and possible TEE if cardiology feels it is necessary. Cardiology consulted, d/w Dr Duke Salvia.  - PT OT eval recommended outpatient OT  - LDL 88, on statin, hemoglobin A1c 5.1 - doppler US showed no LE DVT   Active Problems:   Hypertension -BP stable, not on any antihypertensives outpatient     Atrial flutter (HCC) - EKG showed sinus tachycardia with PVCs, will await cardiology recommendations if patient needs to be on anticoagulation, TEE or loop     Hyperlipidemia - lDL 88, goal less than 70, placed on statin   ?dysphagia: - esophagogram showed no obstruction, patient eating without difficulty  Code Status: full code  DVT Prophylaxis:   SCD's Family Communication: Discussed in detail with the patient, all imaging results, lab results explained to the patient and daughter   Disposition Plan: pending cardiology evaluation   Time Spent in minutes  25 minutes  Procedures:  MRI brain, MRA head and neck  Consultants:   Neurology Cardiology   Antimicrobials:      Medications  Scheduled Meds: . aspirin  300 mg Rectal Daily   Or  . aspirin  325 mg Oral Daily  . atorvastatin  20 mg Oral q1800  . feeding supplement (ENSURE ENLIVE)  237 mL Oral TID BM  . gadobenate dimeglumine  15 mL Intravenous Once   Continuous Infusions: PRN Meds:.acetaminophen **OR** acetaminophen (TYLENOL) oral liquid 160 mg/5 mL **OR** acetaminophen   Antibiotics   Anti-infectives    None        Subjective:   Nathan Perez was seen and examined today. No specific complaints, eating well. Patient  denies dizziness, chest pain, shortness of breath, abdominal pain, N/V/D/C, new weakness, numbess, tingling.   Objective:   Vitals:   09/09/17 2043 09/10/17 0045 09/10/17 0515 09/10/17 0937  BP: 111/66 116/69 123/61 119/73  Pulse: 68 65 100 74  Resp: Temp: 98.3 F (36.8 C) 98.2 F (36.8 C) 97.8 F (36.6 C) 98.1 F (36.7 C)  TempSrc: Oral Oral Oral Oral  SpO2: 100% 100% 100% 98%  Weight:      Height:        Intake/Output Summary (Last 24 hours) at 09/10/17 1610 Last data filed at 09/10/17 1400  Gross per 24 hour  Intake              360 ml  Output             1000 ml  Net             -640 ml     Wt Readings from Last 3 Encounters:  09/09/17 67 kg (147 lb 12.8 oz)  11/26/09 75.6 kg (166 lb 9.6 oz)     Exam   General: Alert and oriented x 3, NAD  Eyes:   HEENT:    Cardiovascular: S1 S2 auscultated, no rubs,  murmurs or gallops. Regular rate and rhythm. No pedal edema b/l  Respiratory: Clear to auscultation bilaterally, no wheezing, rales or rhonchi  Gastrointestinal: Soft, nontender, nondistended, + bowel sounds  Ext: no pedal edema bilaterally  Neuro: no new deficits  Musculoskeletal: No digital cyanosis, clubbing  Skin: No rashes  Psych: Normal affect and demeanor, alert and oriented x3     Data Reviewed:  I have personally reviewed following labs and imaging studies  Micro Results No results found for this or any previous visit (from the past 240 hour(s)).  Radiology Reports Dg Chest 2 View  Result Date: 09/09/2017 CLINICAL DATA:  Multiple falls and difficulty swallowing. EXAM: CHEST  2 VIEW COMPARISON:  12/23/2009 chest CT and 12/02/2009 chest radiograph FINDINGS: Cardiomediastinal silhouette is unchanged. 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 There is no evidence of focal airspace disease, pulmonary edema, suspicious pulmonary nodule/mass, pleural effusion, or pneumothorax. No acute bony abnormalities are identified. IMPRESSION: No active cardiopulmonary disease. Electronically Signed   By: Harmon Pier M.D.   On: 09/09/2017 13:17   Ct Head Wo Contrast  Result Date: 09/08/2017 CLINICAL DATA:  71 y/o M; 2 days of gait problems and right arm weakness for 1 day. EXAM: CT HEAD WITHOUT CONTRAST TECHNIQUE: Contiguous axial images were obtained from the base of the skull through the vertex without intravenous contrast. COMPARISON:  None. FINDINGS: Brain: No evidence of acute infarction, hemorrhage, hydrocephalus, extra-axial collection or mass lesion/mass effect. Nonspecific foci of hypoattenuation in subcortical and periventricular white matter are compatible with mild chronic microvascular ischemic changes and there is mild parenchymal volume loss of the brain for age. Vascular: Calcific atherosclerosis of carotid siphons. No hyperdense vessel identified.  Skull: Normal. Negative for fracture or focal lesion. Sinuses/Orbits: No acute finding. Other: Opacification of external auditory canals, likely cerumen. IMPRESSION: 1. No acute intracranial abnormality identified. If symptoms persist or if clinically indicated MRI is more sensitive for stroke. 2. Mild chronic microvascular ischemic changes and parenchymal volume loss of the brain for age. Electronically Signed   By: Mitzi Hansen M.D.   On: 09/08/2017 22:13   Mr Maxine Glenn Neck W Wo Contrast  Result Date: 09/09/2017 CLINICAL DATA:  71 year old male with chorea movements of the right  arm beginning 3 days ago. EXAM: MRI HEAD WITHOUT CONTRAST MRA HEAD WITHOUT CONTRAST MRA NECK WITHOUT AND WITH CONTRAST TECHNIQUE: Multiplanar, multiecho pulse sequences of the brain and surrounding structures were obtained without intravenous contrast. Angiographic images of the Circle of Willis were obtained using MRA technique without intravenous contrast. Angiographic images of the neck were obtained using MRA technique without and with intravenous contrast. Carotid stenosis measurements (when applicable) are obtained utilizing NASCET criteria, using the distal internal carotid diameter as the denominator. CONTRAST:  15 mL MultiHance COMPARISON:  Head CT without contrast 09/08/2017. FINDINGS: MRI HEAD FINDINGS Brain: Small nodular cortical and subcortical white matter foci of restricted diffusion along the superolateral left motor strip (series 3, image 35 and series 4, image 14), and in the posterior left parietal lobe with little to no associated T2 or FLAIR hyperintensity. No contralateral right hemisphere restricted diffusion. Questionable tiny focus of restricted diffusion in the central right cerebellum (series 4, image 9). No acute or chronic intracranial hemorrhage. No mass effect. No evidence of mass lesion, ventriculomegaly, extra-axial collection. Cervicomedullary junction and pituitary are within normal limits.  Patchy and confluent as well as widely scattered foci of abnormal cerebral white matter T2 and FLAIR hyperintensity in both hemispheres. No cortical encephalomalacia identified. Signal in the bilateral deep gray matter nuclei appears normal. Normal brainstem. Vascular: Major intracranial vascular flow voids are preserved. Skull and upper cervical spine: Negative. Normal bone marrow signal. Sinuses/Orbits: Normal orbits soft tissues. Scattered mild areas of paranasal sinus mucosal thickening or small retention cyst. Other: Grossly normal visible internal auditory structures. Mastoid air cells are clear. Scalp and face soft tissues appear negative. MRA NECK FINDINGS Precontrast time-of-flight images demonstrate antegrade flow in both carotid and vertebral arteries throughout the neck. The right vertebral artery appears dominant. Post-contrast neck MRA images reveal a 3 vessel arch configuration, although the left vertebral arises very near the arch. No great vessel origin stenosis suspected. Both common carotid arteries, carotid bifurcations, and cervical ICAs are normal for age. Minimal atherosclerotic irregularity and no stenosis in the neck. The right vertebral artery is dominant and patent throughout the neck without stenosis although its origin is not well visualized. On source images the left vertebral artery is seen to arise very proximal near the aortic arch. The proximal left vertebral artery is poorly visualized on MIPS images. The left vertebral remains patent to the skullbase without significant stenosis suspected. MRA HEAD FINDINGS The distal left vertebral artery terminates in PICA. The distal right vertebral artery supplies the basilar. No distal right vertebral artery stenosis. The basilar is patent without stenosis. Fetal type bilateral PCA origins, more so the left. SCA origins are patent. Bilateral PCA branches are within normal limits. Antegrade flow in both ICA siphons. Mild siphon irregularity  without stenosis. Normal ophthalmic and posterior communicating artery origins. Patent carotid termini. Normal MCA and ACA origins. Anterior communicating artery and visible ACA branches are within normal limits. Left MCA M1 segment, bifurcation, and visible left MCA branches are within normal limits. Right MCA M1 segment, bifurcation, and visible right MCA branches are within normal limits. IMPRESSION: 1. Positive for several small acute infarcts in the posterior left MCA territory, including involvement of the posterolateral left motor strip. 2. Questionable punctate acute or subacute lacunar infarct superimposed in the right cerebellum, but this may be artifact. 3. No associated hemorrhage or mass effect. 4. Underlying moderately advanced for age but nonspecific bilateral cerebral white matter T2 and FLAIR hyperintensity. 5. Normal noncontrast MRI appearance of the  deep gray matter nuclei and brainstem. 6. Negative for age Neck MRA. 7. Negative for age intracranial MRA. Visible left MCA branches are normal. 8. Incidental normal vascular anatomic variants including dominant right vertebral artery which supplies the basilar, non dominant the left arises near the aortic arch and terminates in the left PICA, and fetal type bilateral PCA origins. Electronically Signed   By: Odessa Fleming M.D.   On: 09/09/2017 13:49   Mr Brain Wo Contrast  Result Date: 09/09/2017 CLINICAL DATA:  71 year old male with chorea movements of the right arm beginning 3 days ago. EXAM: MRI HEAD WITHOUT CONTRAST MRA HEAD WITHOUT CONTRAST MRA NECK WITHOUT AND WITH CONTRAST TECHNIQUE: Multiplanar, multiecho pulse sequences of the brain and surrounding structures were obtained without intravenous contrast. Angiographic images of the Circle of Willis were obtained using MRA technique without intravenous contrast. Angiographic images of the neck were obtained using MRA technique without and with intravenous contrast. Carotid stenosis measurements  (when applicable) are obtained utilizing NASCET criteria, using the distal internal carotid diameter as the denominator. CONTRAST:  15 mL MultiHance COMPARISON:  Head CT without contrast 09/08/2017. FINDINGS: MRI HEAD FINDINGS Brain: Small nodular cortical and subcortical white matter foci of restricted diffusion along the superolateral left motor strip (series 3, image 35 and series 4, image 14), and in the posterior left parietal lobe with little to no associated T2 or FLAIR hyperintensity. No contralateral right hemisphere restricted diffusion. Questionable tiny focus of restricted diffusion in the central right cerebellum (series 4, image 9). No acute or chronic intracranial hemorrhage. No mass effect. No evidence of mass lesion, ventriculomegaly, extra-axial collection. Cervicomedullary junction and pituitary are within normal limits. Patchy and confluent as well as widely scattered foci of abnormal cerebral white matter T2 and FLAIR hyperintensity in both hemispheres. No cortical encephalomalacia identified. Signal in the bilateral deep gray matter nuclei appears normal. Normal brainstem. Vascular: Major intracranial vascular flow voids are preserved. Skull and upper cervical spine: Negative. Normal bone marrow signal. Sinuses/Orbits: Normal orbits soft tissues. Scattered mild areas of paranasal sinus mucosal thickening or small retention cyst. Other: Grossly normal visible internal auditory structures. Mastoid air cells are clear. Scalp and face soft tissues appear negative. MRA NECK FINDINGS Precontrast time-of-flight images demonstrate antegrade flow in both carotid and vertebral arteries throughout the neck. The right vertebral artery appears dominant. Post-contrast neck MRA images reveal a 3 vessel arch configuration, although the left vertebral arises very near the arch. No great vessel origin stenosis suspected. Both common carotid arteries, carotid bifurcations, and cervical ICAs are normal for age.  Minimal atherosclerotic irregularity and no stenosis in the neck. The right vertebral artery is dominant and patent throughout the neck without stenosis although its origin is not well visualized. On source images the left vertebral artery is seen to arise very proximal near the aortic arch. The proximal left vertebral artery is poorly visualized on MIPS images. The left vertebral remains patent to the skullbase without significant stenosis suspected. MRA HEAD FINDINGS The distal left vertebral artery terminates in PICA. The distal right vertebral artery supplies the basilar. No distal right vertebral artery stenosis. The basilar is patent without stenosis. Fetal type bilateral PCA origins, more so the left. SCA origins are patent. Bilateral PCA branches are within normal limits. Antegrade flow in both ICA siphons. Mild siphon irregularity without stenosis. Normal ophthalmic and posterior communicating artery origins. Patent carotid termini. Normal MCA and ACA origins. Anterior communicating artery and visible ACA branches are within normal limits. Left MCA M1  segment, bifurcation, and visible left MCA branches are within normal limits. Right MCA M1 segment, bifurcation, and visible right MCA branches are within normal limits. IMPRESSION: 1. Positive for several small acute infarcts in the posterior left MCA territory, including involvement of the posterolateral left motor strip. 2. Questionable punctate acute or subacute lacunar infarct superimposed in the right cerebellum, but this may be artifact. 3. No associated hemorrhage or mass effect. 4. Underlying moderately advanced for age but nonspecific bilateral cerebral white matter T2 and FLAIR hyperintensity. 5. Normal noncontrast MRI appearance of the deep gray matter nuclei and brainstem. 6. Negative for age Neck MRA. 7. Negative for age intracranial MRA. Visible left MCA branches are normal. 8. Incidental normal vascular anatomic variants including dominant  right vertebral artery which supplies the basilar, non dominant the left arises near the aortic arch and terminates in the left PICA, and fetal type bilateral PCA origins. Electronically Signed   By: Odessa Fleming M.D.   On: 09/09/2017 13:49   Dg Esophagus  Result Date: 09/09/2017 CLINICAL DATA:  71 year old male with history of 20 pound weight loss. Dysphagia. Feelings of regurgitation and difficulty ingesting food for several years. EXAM: ESOPHOGRAM/BARIUM SWALLOW TECHNIQUE: Single contrast examination was performed using  thin barium. FLUOROSCOPY TIME:  Fluoroscopy Time:  1 minutes and 36 seconds Radiation Exposure Index (if provided by the fluoroscopic device): 8.5 mGy COMPARISON:  None. FINDINGS: Limited single contrast esophagram demonstrates a structurally normal esophagus, with no esophageal mass, stricture or esophageal ring. No hiatal hernia. Intermittent failure to propagate a normal primary peristaltic wave was noted. No tertiary contractions. A 13 mm barium tablet was administered, which passed readily into the stomach. IMPRESSION: 1. Mild nonspecific esophageal motility disorder. 2. Otherwise, normal single contrast esophagram. Electronically Signed   By: Trudie Reed M.D.   On: 09/09/2017 16:06   Mr Maxine Glenn Head Wo Contrast  Result Date: 09/09/2017 CLINICAL DATA:  71 year old male with chorea movements of the right arm beginning 3 days ago. EXAM: MRI HEAD WITHOUT CONTRAST MRA HEAD WITHOUT CONTRAST MRA NECK WITHOUT AND WITH CONTRAST TECHNIQUE: Multiplanar, multiecho pulse sequences of the brain and surrounding structures were obtained without intravenous contrast. Angiographic images of the Circle of Willis were obtained using MRA technique without intravenous contrast. Angiographic images of the neck were obtained using MRA technique without and with intravenous contrast. Carotid stenosis measurements (when applicable) are obtained utilizing NASCET criteria, using the distal internal carotid  diameter as the denominator. CONTRAST:  15 mL MultiHance COMPARISON:  Head CT without contrast 09/08/2017. FINDINGS: MRI HEAD FINDINGS Brain: Small nodular cortical and subcortical white matter foci of restricted diffusion along the superolateral left motor strip (series 3, image 35 and series 4, image 14), and in the posterior left parietal lobe with little to no associated T2 or FLAIR hyperintensity. No contralateral right hemisphere restricted diffusion. Questionable tiny focus of restricted diffusion in the central right cerebellum (series 4, image 9). No acute or chronic intracranial hemorrhage. No mass effect. No evidence of mass lesion, ventriculomegaly, extra-axial collection. Cervicomedullary junction and pituitary are within normal limits. Patchy and confluent as well as widely scattered foci of abnormal cerebral white matter T2 and FLAIR hyperintensity in both hemispheres. No cortical encephalomalacia identified. Signal in the bilateral deep gray matter nuclei appears normal. Normal brainstem. Vascular: Major intracranial vascular flow voids are preserved. Skull and upper cervical spine: Negative. Normal bone marrow signal. Sinuses/Orbits: Normal orbits soft tissues. Scattered mild areas of paranasal sinus mucosal thickening or small retention cyst. Other:  Grossly normal visible internal auditory structures. Mastoid air cells are clear. Scalp and face soft tissues appear negative. MRA NECK FINDINGS Precontrast time-of-flight images demonstrate antegrade flow in both carotid and vertebral arteries throughout the neck. The right vertebral artery appears dominant. Post-contrast neck MRA images reveal a 3 vessel arch configuration, although the left vertebral arises very near the arch. No great vessel origin stenosis suspected. Both common carotid arteries, carotid bifurcations, and cervical ICAs are normal for age. Minimal atherosclerotic irregularity and no stenosis in the neck. The right vertebral artery  is dominant and patent throughout the neck without stenosis although its origin is not well visualized. On source images the left vertebral artery is seen to arise very proximal near the aortic arch. The proximal left vertebral artery is poorly visualized on MIPS images. The left vertebral remains patent to the skullbase without significant stenosis suspected. MRA HEAD FINDINGS The distal left vertebral artery terminates in PICA. The distal right vertebral artery supplies the basilar. No distal right vertebral artery stenosis. The basilar is patent without stenosis. Fetal type bilateral PCA origins, more so the left. SCA origins are patent. Bilateral PCA branches are within normal limits. Antegrade flow in both ICA siphons. Mild siphon irregularity without stenosis. Normal ophthalmic and posterior communicating artery origins. Patent carotid termini. Normal MCA and ACA origins. Anterior communicating artery and visible ACA branches are within normal limits. Left MCA M1 segment, bifurcation, and visible left MCA branches are within normal limits. Right MCA M1 segment, bifurcation, and visible right MCA branches are within normal limits. IMPRESSION: 1. Positive for several small acute infarcts in the posterior left MCA territory, including involvement of the posterolateral left motor strip. 2. Questionable punctate acute or subacute lacunar infarct superimposed in the right cerebellum, but this may be artifact. 3. No associated hemorrhage or mass effect. 4. Underlying moderately advanced for age but nonspecific bilateral cerebral white matter T2 and FLAIR hyperintensity. 5. Normal noncontrast MRI appearance of the deep gray matter nuclei and brainstem. 6. Negative for age Neck MRA. 7. Negative for age intracranial MRA. Visible left MCA branches are normal. 8. Incidental normal vascular anatomic variants including dominant right vertebral artery which supplies the basilar, non dominant the left arises near the aortic  arch and terminates in the left PICA, and fetal type bilateral PCA origins. Electronically Signed   By: Odessa Fleming M.D.   On: 09/09/2017 13:49    Lab Data:  CBC:  Recent Labs Lab 09/08/17 2008 09/08/17 2028 09/09/17 0925 09/10/17 0524  WBC 5.5  --  6.3 4.7  NEUTROABS 3.4  --   --   --   HGB 13.7 16.3 14.7 13.6  HCT 42.2 48.0 43.4 40.5  MCV 98.8  --  99.5 98.3  PLT 262  --  277 263   Basic Metabolic Panel:  Recent Labs Lab 09/08/17 2008 09/08/17 2028 09/09/17 0925 09/10/17 0524  NA 135 138 136 136  K 3.8 3.6 3.7 4.0  CL 101 101 101 102  CO2 24  --  24 28  GLUCOSE 94 92 108* 95  BUN CREATININE 1.32* 1.20 1.16 1.20  CALCIUM 9.4  --  9.1 9.1   GFR: Estimated Creatinine Clearance: 52.7 mL/min (by C-G formula based on SCr of 1.2 mg/dL). Liver Function Tests:  Recent Labs Lab 09/08/17 2008  AST 23  ALT 14*  ALKPHOS 63  BILITOT 0.8  PROT 7.3  ALBUMIN 4.0   No results for input(s): LIPASE, AMYLASE in  the last 168 hours. No results for input(s): AMMONIA in the last 168 hours. Coagulation Profile:  Recent Labs Lab 09/08/17 2008  INR 1.00   Cardiac Enzymes:  Recent Labs Lab 09/09/17 0351 09/09/17 0925 09/09/17 1614  TROPONINI 0.03* <0.03 <0.03   BNP (last 3 results) No results for input(s): PROBNP in the last 8760 hours. HbA1C:  Recent Labs  09/09/17 0925  HGBA1C 5.1   CBG: No results for input(s): GLUCAP in the last 168 hours. Lipid Profile:  Recent Labs  09/09/17 0925  CHOL 164  HDL 64  LDLCALC 88  TRIG 60  CHOLHDL 2.6   Thyroid Function Tests:  Recent Labs  09/09/17 0351  TSH 3.672   Anemia Panel: No results for input(s): VITAMINB12, FOLATE, FERRITIN, TIBC, IRON, RETICCTPCT in the last 72 hours. Urine analysis:    Component Value Date/Time   COLORURINE STRAW (A) 09/08/2017 2045   APPEARANCEUR CLEAR 09/08/2017 2045   LABSPEC 1.004 (L) 09/08/2017 2045   PHURINE 5.0 09/08/2017 2045   GLUCOSEU NEGATIVE 09/08/2017 2045    HGBUR LARGE (A) 09/08/2017 2045   BILIRUBINUR NEGATIVE 09/08/2017 2045   KETONESUR NEGATIVE 09/08/2017 2045   PROTEINUR NEGATIVE 09/08/2017 2045   UROBILINOGEN 1.0 04/28/2013 1205   NITRITE NEGATIVE 09/08/2017 2045   LEUKOCYTESUR NEGATIVE 09/08/2017 2045     Kaiyah Eber M.D. [facility] 09/10/2017, 4:10 PM  Pager: [phone] Between 7am to 7pm - call Pager - [phone]  After 7pm go to [email or web address] - password [email or web address]  Call night coverage person covering after 7pm

## 2017-09-10 NOTE — Progress Notes (Signed)
*  PRELIMINARY RESULTS* Vascular Ultrasound Lower extremity venous duplex has been completed.  Preliminary findings: No evidence of DVT or baker's cyst.     Farrel Demark, RDMS, RVT  09/10/2017, 2:17 PM

## 2017-09-10 NOTE — Care Management Obs Status (Signed)
MEDICARE OBSERVATION STATUS NOTIFICATION   Patient Details  Name: Nathan Perez MRN: [id number] Date of Birth: 1946-10-21   Medicare Observation Status Notification Given:  Yes    Kermit Balo, RN 09/10/2017, 3:01 PM

## 2017-09-10 NOTE — Progress Notes (Addendum)
STROKE TEAM PROGRESS NOTE   SUBJECTIVE (INTERVAL HISTORY) No family is at the bedside. On acute event overnight. NPO this am for TEE but it apparently scheduled no Monday. TTE showed low EF. Need cardiology consult.    OBJECTIVE Temp:  [97.7 F (36.5 C)-98.3 F (36.8 C)] 98.1 F (36.7 C) (09/28 0937) Pulse Rate:  [65-100] 74 (09/28 0937) Cardiac Rhythm: Atrial flutter (09/28 0700) Resp:  [18-20] 20 (09/28 0937) BP: (111-123)/(61-74) 119/73 (09/28 0937) SpO2:  [98 %-100 %] 98 % (09/28 0937)  CBC:   Recent Labs Lab 09/08/17 2008  09/09/17 0925 09/10/17 0524  WBC 5.5  --  6.3 4.7  NEUTROABS 3.4  --   --   --   HGB 13.7  < > 14.7 13.6  HCT 42.2  < > 43.4 40.5  MCV 98.8  --  99.5 98.3  PLT 262  --  277 263  < > = values in this interval not displayed.  Basic Metabolic Panel:   Recent Labs Lab 09/09/17 0925 09/10/17 0524  NA 136 136  K 3.7 4.0  CL 101 102  CO2 24 28  GLUCOSE 108* 95  BUN 7 9  CREATININE 1.16 1.20  CALCIUM 9.1 9.1    Lipid Panel:     Component Value Date/Time   CHOL 164 09/09/2017 0925   TRIG 60 09/09/2017 0925   HDL 64 09/09/2017 0925   CHOLHDL 2.6 09/09/2017 0925   VLDL 12 09/09/2017 0925   LDLCALC 88 09/09/2017 0925   HgbA1c:  Lab Results  Component Value Date   HGBA1C 5.1 09/09/2017   Urine Drug Screen: No results found for: LABOPIA, COCAINSCRNUR, LABBENZ, AMPHETMU, THCU, LABBARB  Alcohol Level No results found for: ETH  IMAGING I have personally reviewed the radiological images below and agree with the radiology interpretations.  Ct Head Wo Contrast 09/08/2017 IMPRESSION: 1. No acute intracranial abnormality identified. If symptoms persist or if clinically indicated MRI is more sensitive for stroke. 2. Mild chronic microvascular ischemic changes and parenchymal volume loss of the brain for age.   DG Chest 2 View 09/09/2017 IMPRESSION: No active cardiopulmonary disease.  MRI Brain Wo Contrast MRA Brain Wo  Contrast 09/09/2017 IMPRESSION: 1. Positive for several small acute infarcts in the posterior left MCA territory, including involvement of the posterolateral left motor strip. 2. Questionable punctate acute or subacute lacunar infarct superimposed in the right cerebellum, but this may be artifact. 3. No associated hemorrhage or mass effect. 4. Underlying moderately advanced for age but nonspecific bilateral cerebral white matter T2 and FLAIR hyperintensity. 5. Normal noncontrast MRI appearance of the deep gray matter nuclei and brainstem. 6. Negative for age Neck MRA. 7. Negative for age intracranial MRA. Visible left MCA branches are normal. 8. Incidental normal vascular anatomic variants including dominant right vertebral artery which supplies the basilar, non dominant the left arises near the aortic arch and terminates in the left PICA, and fetal type bilateral PCA origins.  TTE 09/09/2017 - Left ventricle: The cavity size was normal. Wall thickness was   normal. Systolic function was severely reduced. The estimated   ejection fraction was in the range of 25% to 30%. Severe diffuse   hypokinesis with no identifiable regional variations. No evidence   of thrombus. - Right ventricle: The cavity size was mildly dilated. Systolic   function was moderately reduced. - Right atrium: The atrium was mildly dilated.  LE venous doppler - no DVT   TEE pending   PHYSICAL EXAM  Temp:  [  97.7 F (36.5 C)-98.3 F (36.8 C)] 98.1 F (36.7 C) (09/28 0937) Pulse Rate:  [65-100] 74 (09/28 0937) Resp:  [18-20] 20 (09/28 0937) BP: (111-123)/(61-74) 119/73 (09/28 0937) SpO2:  [98 %-100 %] 98 % (09/28 0937)  General - Well nourished, well developed, in no apparent distress.  Ophthalmologic - Fundi not visualized due to noncooperation.  Cardiovascular - Regular rate and rhythm.  Mental Status -  Level of arousal and orientation to time, place, and person were intact. Language including  expression, naming, repetition, comprehension was assessed and found intact. Fund of Knowledge was assessed and was intact.  Cranial Nerves II - XII - II - Visual field intact OU. III, IV, VI - Extraocular movements intact. V - Facial sensation intact bilaterally. VII - mild right facial droop. VIII - Hearing & vestibular intact bilaterally. X - Palate elevates symmetrically, mild dysarthria. XI - Chin turning & shoulder shrug intact bilaterally. XII - Tongue protrusion intact.  Motor Strength - The patient's strength was normal in all extremities and pronator drift was absent.  Bulk was normal and fasciculations were absent.   Motor Tone - Muscle tone was assessed at the neck and appendages and was normal.  Reflexes - The patient's reflexes were 1+ in all extremities and he had no pathological reflexes.  Sensory - Light touch, temperature/pinprick were assessed and were symmetrical.    Coordination - The patient had normal movements in the hands and feet with no ataxia or dysmetria.  Tremor was absent.  Gait and Station - deferred.   ASSESSMENT/PLAN [name] is a 71 y.o. male with history of tobacco abuse and no known other vascular risk factors presents to the emergency room after 2 falls today. He did not receive IV t-PA due to arriving outside of the tPA treatment window.   Stroke: 3 x punctate L MCA territory and 1 x right cerebellum acute infarcts, likely cardioembolic, of undetermined etiology   Resultant  mild right facial droop and dysarthria  CT head: no acute stroke  MRI head: Multiple small L MCA territory acute infarcts including the posterolateral left motor strip and right cerebellum.  MRA head and neck: no LVO or high-grade stenosis  LE venous doppler - no DVT  2D Echo: EF 25-30%, no thrombus  Recommend cardiology consult for cardiomyopathy with low EF  Recommend TEE and loop recorder to evaluate cardioembolic source  LDL 88  HgbA1c  5.1  Lovenox 40 mg sq daily for VTE prophylaxis DIET DYS 3 Room service appropriate? Yes; Fluid consistency: Thin  No antithrombotic prior to admission, now on aspirin 325 mg daily. Due to cardiomyopathy with low EF and embolic infarcts, may need to consider anticoagulation for stroke prevention.  Patient counseled to be compliant with his antithrombotic medications  Ongoing aggressive stroke risk factor management  Therapy recommendations: None  Disposition: Pending  Cardiomyopathy   TTE with EF 25-30%  Recommend cardiology consult  Recommend TEE to rule out thrombus  For secondary stroke prevention, may need to consider anticoagulation. And then repeat TTE in 3 months, if EF > 35%, anticoagulation can be discontinued.  Hypertension  Stable  Permissive hypertension (OK if < 220/120) but gradually normalize in 5-7 days  Long-term BP goal normotensive  Hyperlipidemia  Home meds:  none  LDL 88, goal < 70  Add atorvastatin 20 mg PO daily  Continue statin at discharge  Tobacco abuse  Current smoker  Smoking cessation counseling provided  Pt is willing to quit  Other  Stroke Risk Factors  Advanced age  ETOH use, advised to drink no more than 2 drink(s) a day  Other Active Problems  None  Hospital day # 1  Marvel Plan, MD PhD Stroke Neurology 09/10/2017 3:16 PM    To contact Stroke Continuity provider, please refer to [email or web address]. After hours, contact General Neurology

## 2017-09-10 NOTE — Progress Notes (Signed)
Patient is scheduled for TEE on Monday 09/13/17 at 1300 with Dr. Tenny Craw. I spoke with the patient RE r/b. Patient currently refuses. He said he needs the weekend to think about it. I have made him NPO Sunday night, but TEE orders have not been done.   Roe Rutherford Duke, PA-C 09/10/2017, 5:32 PM [phone]

## 2017-09-10 NOTE — Progress Notes (Signed)
  Echocardiogram 2D Echocardiogram has been performed.  Nathan Perez 09/10/2017, 2:15 PM

## 2017-09-10 NOTE — Discharge Summary (Signed)
Physician Discharge Summary   Patient ID: Nathan Perez MRN: [id number] DOB/AGE: July 29, 1946 71 y.o.  Admit date: 09/09/2017 Discharge date: 09/13/2017  Primary Care Physician:  No primary care provider on file.  Discharge Diagnoses:    . Acute embolic CVA (cerebrovascular accident) (HCC) . Hyperlipidemia   Atrial flutter   Hypertension   Cardiomyopathy-ischemic, new   Consults:  NEUROLOGY cardiology  Recommendations for Outpatient Follow-up:  1. Cardiology recommended elective cardioversion with TEE in 4 weeks. 2. Please repeat CBC/BMET at next visit   DIET: heart healthy diet    Allergies:  No Known Allergies   DISCHARGE MEDICATIONS: Current Discharge Medication List    START taking these medications   Details  apixaban (ELIQUIS) 5 MG TABS tablet Take 1 tablet (5 mg total) by mouth 2 (two) times daily. Qty: 60 tablet, Refills: 2    aspirin EC 81 MG EC tablet Take 1 tablet (81 mg total) by mouth daily. Qty: 30 tablet, Refills: 3    atorvastatin (LIPITOR) 20 MG tablet Take 1 tablet (20 mg total) by mouth at bedtime. Qty: 30 tablet, Refills: 4    losartan (COZAAR) 25 MG tablet Take 1 tablet (25 mg total) by mouth daily. Qty: 30 tablet, Refills: 3    metoprolol succinate (TOPROL-XL) 25 MG 24 hr tablet Take 1 tablet (25 mg total) by mouth daily. Qty: 30 tablet, Refills: 3    UNABLE TO FIND Outpatient PTOT (physical therapies- neuro)  Diagnosis: acute stroke Qty: 1 Mutually Defined, Refills: 0         Brief H and P: For complete details please refer to admission H and P, but in brief Patient is a 71 year old male who had a fall on the day of admission at about 5:30 PM. Subsequently went inside and fell in his room again. Patient was brought to the ED by his family. Initial CT head was negative. Patient was admitted due to the concern for CVA   Hospital Course:    Acute CVA (cerebrovascular accident) (HCC)presenting with falls - MRI of the brain showed  positive several small acute infarcts in the posterior left MCA territory including involvement of the posterior lateral left motor strip, question about punctate acute or subacute lacunar infarct superimposed in the right cerebellum, may be artifact. No associated hemorrhage or mass effect. - Negative intracranial MRA head and neck -- PT OT Recommended outpatient PT - LDL 88, on statin, hemoglobin A1c 5.1 - patient was altered by speech therapist, DG esophagogram showed no stricture or obstruction, currently tolerating diet - 2-D echo showed EF of 25-30% with diffuse hypokinesis - Doppler ultrasound of the lower extremity showed no evidence of DVT - discussed with Dr. 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 - per cardiology, Dr. Donnie Aho, ideally would be good to have cardiac catheterization however he has a recent acute stroke, he should have time to recover from the strokes or he would have increased risk of issues from catheterization. At this time not a good candidate for intervention. Then bradycardia from neurological viewpoint, change him to oral anticoagulation and planned to do an elective cardioversion with TEE in 4 weeks to see if he has improvement in LV function and consider catheterization down the road - Continue aspirin 81 mg daily with NOAC given ischemic cardiomyopathy,  continue losartan, metoprolol  Hypertension -BP stable, not on any antihypertensives outpatient, started on losartan, metoprolol     Atrial flutter (HCC) - per cardiology, needs TEE/DCCV, due to acute stroke would wait at least 4 weeks prior to cardioversion, plan outpatient - continue beta blocker for rate control, started on eliquis     Hyperlipidemia - lDL 88,  goal less than 70, placed on statin   ?dysphagia: - esophagogram showed no obstruction, patient eating without difficulty   Day of Discharge BP 131/86 (BP Location: Right Arm)   Pulse 99   Temp 98.8 F (37.1 C) (Oral)   Resp 18   Ht 6\' 1"  (1.854 m)   Wt 67 kg (147 lb 12.8 oz)   SpO2 99%   BMI 19.50 kg/m   Physical Exam: General: Alert and awake oriented x3 not in any acute distress. HEENT: anicteric sclera, pupils reactive to light and accommodation CVS: S1-S2 clear no murmur rubs or gallops Chest: clear to auscultation bilaterally, no wheezing rales or rhonchi Abdomen: soft nontender, nondistended, normal bowel sounds Extremities: no cyanosis, clubbing or edema noted bilaterally Neuro: Cranial nerves II-XII intact, no focal neurological deficits   The results of significant diagnostics from this hospitalization (including imaging, microbiology, ancillary and laboratory) are listed below for reference.    LAB RESULTS: Basic Metabolic Panel:  Recent Labs Lab 09/11/17 0332 09/12/17 0519  NA 138 138  K 4.0 4.0  CL 100* 101  CO2 29 28  GLUCOSE 97 96  BUN 10 8  CREATININE 1.16 1.19  CALCIUM 9.4 9.2   Liver Function Tests:  Recent Labs Lab 09/08/17 2008  AST 23  ALT 14*  ALKPHOS 63  BILITOT 0.8  PROT 7.3  ALBUMIN 4.0   No results for input(s): LIPASE, AMYLASE in the last 168 hours. No results for input(s): AMMONIA in the last 168 hours. CBC:  Recent Labs Lab 09/08/17 2008  09/12/17 0519 09/13/17 0515  WBC 5.5  < > 5.2 4.9  NEUTROABS 3.4  --   --   --   HGB 13.7  < > 13.9 14.0  HCT 42.2  < > 41.6 42.2  MCV 98.8  < > 99.0 98.8  PLT 262  < > 308 317  < > = values in this interval not displayed. Cardiac Enzymes:  Recent Labs Lab 09/09/17 0925 09/09/17 1614  TROPONINI <0.03 <0.03   BNP: Invalid input(s): POCBNP CBG: No results for input(s): GLUCAP in the last 168 hours.  Significant Diagnostic Studies:  Dg Chest 2 View  Result Date:  09/09/2017 CLINICAL DATA:  Multiple falls and difficulty swallowing. EXAM: CHEST  2 VIEW COMPARISON:  12/23/2009 chest CT and 12/02/2009 chest radiograph FINDINGS: Cardiomediastinal silhouette is unchanged. 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 There is no evidence of focal airspace disease, pulmonary edema, suspicious pulmonary nodule/mass, pleural effusion, or pneumothorax. No acute bony abnormalities are identified. IMPRESSION: No active cardiopulmonary disease. Electronically Signed   By: Harmon Pier M.D.   On: 09/09/2017 13:17   Mr Maxine Glenn Neck W Wo Contrast  Result Date: 09/09/2017 CLINICAL DATA:  71 year old male with chorea movements of the right arm beginning 3 days ago. EXAM: MRI HEAD WITHOUT CONTRAST MRA HEAD WITHOUT CONTRAST MRA NECK WITHOUT AND WITH CONTRAST TECHNIQUE: Multiplanar, multiecho pulse sequences of the brain and surrounding structures were obtained without intravenous contrast. Angiographic images of the Circle of Willis were obtained using MRA technique without intravenous contrast. Angiographic images of the neck were obtained using MRA technique  without and with intravenous contrast. Carotid stenosis measurements (when applicable) are obtained utilizing NASCET criteria, using the distal internal carotid diameter as the denominator. CONTRAST:  15 mL MultiHance COMPARISON:  Head CT without contrast 09/08/2017. FINDINGS: MRI HEAD FINDINGS Brain: Small nodular cortical and subcortical white matter foci of restricted diffusion along the superolateral left motor strip (series 3, image 35 and series 4, image 14), and in the posterior left parietal lobe with little to no associated T2 or FLAIR hyperintensity. No contralateral right hemisphere restricted diffusion. Questionable tiny focus of restricted diffusion in the central right cerebellum (series 4, image 9). No acute or chronic intracranial hemorrhage. No mass effect. No evidence of mass lesion,  ventriculomegaly, extra-axial collection. Cervicomedullary junction and pituitary are within normal limits. Patchy and confluent as well as widely scattered foci of abnormal cerebral white matter T2 and FLAIR hyperintensity in both hemispheres. No cortical encephalomalacia identified. Signal in the bilateral deep gray matter nuclei appears normal. Normal brainstem. Vascular: Major intracranial vascular flow voids are preserved. Skull and upper cervical spine: Negative. Normal bone marrow signal. Sinuses/Orbits: Normal orbits soft tissues. Scattered mild areas of paranasal sinus mucosal thickening or small retention cyst. Other: Grossly normal visible internal auditory structures. Mastoid air cells are clear. Scalp and face soft tissues appear negative. MRA NECK FINDINGS Precontrast time-of-flight images demonstrate antegrade flow in both carotid and vertebral arteries throughout the neck. The right vertebral artery appears dominant. Post-contrast neck MRA images reveal a 3 vessel arch configuration, although the left vertebral arises very near the arch. No great vessel origin stenosis suspected. Both common carotid arteries, carotid bifurcations, and cervical ICAs are normal for age. Minimal atherosclerotic irregularity and no stenosis in the neck. The right vertebral artery is dominant and patent throughout the neck without stenosis although its origin is not well visualized. On source images the left vertebral artery is seen to arise very proximal near the aortic arch. The proximal left vertebral artery is poorly visualized on MIPS images. The left vertebral remains patent to the skullbase without significant stenosis suspected. MRA HEAD FINDINGS The distal left vertebral artery terminates in PICA. The distal right vertebral artery supplies the basilar. No distal right vertebral artery stenosis. The basilar is patent without stenosis. Fetal type bilateral PCA origins, more so the left. SCA origins are patent.  Bilateral PCA branches are within normal limits. Antegrade flow in both ICA siphons. Mild siphon irregularity without stenosis. Normal ophthalmic and posterior communicating artery origins. Patent carotid termini. Normal MCA and ACA origins. Anterior communicating artery and visible ACA branches are within normal limits. Left MCA M1 segment, bifurcation, and visible left MCA branches are within normal limits. Right MCA M1 segment, bifurcation, and visible right MCA branches are within normal limits. IMPRESSION: 1. Positive for several small acute infarcts in the posterior left MCA territory, including involvement of the posterolateral left motor strip. 2. Questionable punctate acute or subacute lacunar infarct superimposed in the right cerebellum, but this may be artifact. 3. No associated hemorrhage or mass effect. 4. Underlying moderately advanced for age but nonspecific bilateral cerebral white matter T2 and FLAIR hyperintensity. 5. Normal noncontrast MRI appearance of the deep gray matter nuclei and brainstem. 6. Negative for age Neck MRA. 7. Negative for age intracranial MRA. Visible left MCA branches are normal. 8. Incidental normal vascular anatomic variants including dominant right vertebral artery which supplies the basilar, non dominant the left arises near the aortic arch and terminates in the left PICA, and fetal type bilateral PCA origins. Electronically  Signed   By: Odessa Fleming M.D.   On: 09/09/2017 13:49   Mr Brain Wo Contrast  Result Date: 09/09/2017 CLINICAL DATA:  71 year old male with chorea movements of the right arm beginning 3 days ago. EXAM: MRI HEAD WITHOUT CONTRAST MRA HEAD WITHOUT CONTRAST MRA NECK WITHOUT AND WITH CONTRAST TECHNIQUE: Multiplanar, multiecho pulse sequences of the brain and surrounding structures were obtained without intravenous contrast. Angiographic images of the Circle of Willis were obtained using MRA technique without intravenous contrast. Angiographic images of the  neck were obtained using MRA technique without and with intravenous contrast. Carotid stenosis measurements (when applicable) are obtained utilizing NASCET criteria, using the distal internal carotid diameter as the denominator. CONTRAST:  15 mL MultiHance COMPARISON:  Head CT without contrast 09/08/2017. FINDINGS: MRI HEAD FINDINGS Brain: Small nodular cortical and subcortical white matter foci of restricted diffusion along the superolateral left motor strip (series 3, image 35 and series 4, image 14), and in the posterior left parietal lobe with little to no associated T2 or FLAIR hyperintensity. No contralateral right hemisphere restricted diffusion. Questionable tiny focus of restricted diffusion in the central right cerebellum (series 4, image 9). No acute or chronic intracranial hemorrhage. No mass effect. No evidence of mass lesion, ventriculomegaly, extra-axial collection. Cervicomedullary junction and pituitary are within normal limits. Patchy and confluent as well as widely scattered foci of abnormal cerebral white matter T2 and FLAIR hyperintensity in both hemispheres. No cortical encephalomalacia identified. Signal in the bilateral deep gray matter nuclei appears normal. Normal brainstem. Vascular: Major intracranial vascular flow voids are preserved. Skull and upper cervical spine: Negative. Normal bone marrow signal. Sinuses/Orbits: Normal orbits soft tissues. Scattered mild areas of paranasal sinus mucosal thickening or small retention cyst. Other: Grossly normal visible internal auditory structures. Mastoid air cells are clear. Scalp and face soft tissues appear negative. MRA NECK FINDINGS Precontrast time-of-flight images demonstrate antegrade flow in both carotid and vertebral arteries throughout the neck. The right vertebral artery appears dominant. Post-contrast neck MRA images reveal a 3 vessel arch configuration, although the left vertebral arises very near the arch. No great vessel origin  stenosis suspected. Both common carotid arteries, carotid bifurcations, and cervical ICAs are normal for age. Minimal atherosclerotic irregularity and no stenosis in the neck. The right vertebral artery is dominant and patent throughout the neck without stenosis although its origin is not well visualized. On source images the left vertebral artery is seen to arise very proximal near the aortic arch. The proximal left vertebral artery is poorly visualized on MIPS images. The left vertebral remains patent to the skullbase without significant stenosis suspected. MRA HEAD FINDINGS The distal left vertebral artery terminates in PICA. The distal right vertebral artery supplies the basilar. No distal right vertebral artery stenosis. The basilar is patent without stenosis. Fetal type bilateral PCA origins, more so the left. SCA origins are patent. Bilateral PCA branches are within normal limits. Antegrade flow in both ICA siphons. Mild siphon irregularity without stenosis. Normal ophthalmic and posterior communicating artery origins. Patent carotid termini. Normal MCA and ACA origins. Anterior communicating artery and visible ACA branches are within normal limits. Left MCA M1 segment, bifurcation, and visible left MCA branches are within normal limits. Right MCA M1 segment, bifurcation, and visible right MCA branches are within normal limits. IMPRESSION: 1. Positive for several small acute infarcts in the posterior left MCA territory, including involvement of the posterolateral left motor strip. 2. Questionable punctate acute or subacute lacunar infarct superimposed in the right cerebellum, but  this may be artifact. 3. No associated hemorrhage or mass effect. 4. Underlying moderately advanced for age but nonspecific bilateral cerebral white matter T2 and FLAIR hyperintensity. 5. Normal noncontrast MRI appearance of the deep gray matter nuclei and brainstem. 6. Negative for age Neck MRA. 7. Negative for age intracranial MRA.  Visible left MCA branches are normal. 8. Incidental normal vascular anatomic variants including dominant right vertebral artery which supplies the basilar, non dominant the left arises near the aortic arch and terminates in the left PICA, and fetal type bilateral PCA origins. Electronically Signed   By: Odessa Fleming M.D.   On: 09/09/2017 13:49   Dg Esophagus  Result Date: 09/09/2017 CLINICAL DATA:  71 year old male with history of 20 pound weight loss. Dysphagia. Feelings of regurgitation and difficulty ingesting food for several years. EXAM: ESOPHOGRAM/BARIUM SWALLOW TECHNIQUE: Single contrast examination was performed using  thin barium. FLUOROSCOPY TIME:  Fluoroscopy Time:  1 minutes and 36 seconds Radiation Exposure Index (if provided by the fluoroscopic device): 8.5 mGy COMPARISON:  None. FINDINGS: Limited single contrast esophagram demonstrates a structurally normal esophagus, with no esophageal mass, stricture or esophageal ring. No hiatal hernia. Intermittent failure to propagate a normal primary peristaltic wave was noted. No tertiary contractions. A 13 mm barium tablet was administered, which passed readily into the stomach. IMPRESSION: 1. Mild nonspecific esophageal motility disorder. 2. Otherwise, normal single contrast esophagram. Electronically Signed   By: Trudie Reed M.D.   On: 09/09/2017 16:06   Mr Maxine Glenn Head Wo Contrast  Result Date: 09/09/2017 CLINICAL DATA:  71 year old male with chorea movements of the right arm beginning 3 days ago. EXAM: MRI HEAD WITHOUT CONTRAST MRA HEAD WITHOUT CONTRAST MRA NECK WITHOUT AND WITH CONTRAST TECHNIQUE: Multiplanar, multiecho pulse sequences of the brain and surrounding structures were obtained without intravenous contrast. Angiographic images of the Circle of Willis were obtained using MRA technique without intravenous contrast. Angiographic images of the neck were obtained using MRA technique without and with intravenous contrast. Carotid stenosis  measurements (when applicable) are obtained utilizing NASCET criteria, using the distal internal carotid diameter as the denominator. CONTRAST:  15 mL MultiHance COMPARISON:  Head CT without contrast 09/08/2017. FINDINGS: MRI HEAD FINDINGS Brain: Small nodular cortical and subcortical white matter foci of restricted diffusion along the superolateral left motor strip (series 3, image 35 and series 4, image 14), and in the posterior left parietal lobe with little to no associated T2 or FLAIR hyperintensity. No contralateral right hemisphere restricted diffusion. Questionable tiny focus of restricted diffusion in the central right cerebellum (series 4, image 9). No acute or chronic intracranial hemorrhage. No mass effect. No evidence of mass lesion, ventriculomegaly, extra-axial collection. Cervicomedullary junction and pituitary are within normal limits. Patchy and confluent as well as widely scattered foci of abnormal cerebral white matter T2 and FLAIR hyperintensity in both hemispheres. No cortical encephalomalacia identified. Signal in the bilateral deep gray matter nuclei appears normal. Normal brainstem. Vascular: Major intracranial vascular flow voids are preserved. Skull and upper cervical spine: Negative. Normal bone marrow signal. Sinuses/Orbits: Normal orbits soft tissues. Scattered mild areas of paranasal sinus mucosal thickening or small retention cyst. Other: Grossly normal visible internal auditory structures. Mastoid air cells are clear. Scalp and face soft tissues appear negative. MRA NECK FINDINGS Precontrast time-of-flight images demonstrate antegrade flow in both carotid and vertebral arteries throughout the neck. The right vertebral artery appears dominant. Post-contrast neck MRA images reveal a 3 vessel arch configuration, although the left vertebral arises very near the arch.  No great vessel origin stenosis suspected. Both common carotid arteries, carotid bifurcations, and cervical ICAs are  normal for age. Minimal atherosclerotic irregularity and no stenosis in the neck. The right vertebral artery is dominant and patent throughout the neck without stenosis although its origin is not well visualized. On source images the left vertebral artery is seen to arise very proximal near the aortic arch. The proximal left vertebral artery is poorly visualized on MIPS images. The left vertebral remains patent to the skullbase without significant stenosis suspected. MRA HEAD FINDINGS The distal left vertebral artery terminates in PICA. The distal right vertebral artery supplies the basilar. No distal right vertebral artery stenosis. The basilar is patent without stenosis. Fetal type bilateral PCA origins, more so the left. SCA origins are patent. Bilateral PCA branches are within normal limits. Antegrade flow in both ICA siphons. Mild siphon irregularity without stenosis. Normal ophthalmic and posterior communicating artery origins. Patent carotid termini. Normal MCA and ACA origins. Anterior communicating artery and visible ACA branches are within normal limits. Left MCA M1 segment, bifurcation, and visible left MCA branches are within normal limits. Right MCA M1 segment, bifurcation, and visible right MCA branches are within normal limits. IMPRESSION: 1. Positive for several small acute infarcts in the posterior left MCA territory, including involvement of the posterolateral left motor strip. 2. Questionable punctate acute or subacute lacunar infarct superimposed in the right cerebellum, but this may be artifact. 3. No associated hemorrhage or mass effect. 4. Underlying moderately advanced for age but nonspecific bilateral cerebral white matter T2 and FLAIR hyperintensity. 5. Normal noncontrast MRI appearance of the deep gray matter nuclei and brainstem. 6. Negative for age Neck MRA. 7. Negative for age intracranial MRA. Visible left MCA branches are normal. 8. Incidental normal vascular anatomic variants  including dominant right vertebral artery which supplies the basilar, non dominant the left arises near the aortic arch and terminates in the left PICA, and fetal type bilateral PCA origins. Electronically Signed   By: Odessa Fleming M.D.   On: 09/09/2017 13:49    2D ECHO: Study Conclusions  - Left ventricle: The cavity size was normal. Wall thickness was   normal. Systolic function was severely reduced. The estimated   ejection fraction was in the range of 25% to 30%. Severe diffuse   hypokinesis with no identifiable regional variations. No evidence   of thrombus. - Right ventricle: The cavity size was mildly dilated. Systolic   function was moderately reduced. - Right atrium: The atrium was mildly dilated.  Disposition and Follow-up: Discharge Instructions    Ambulatory referral to Neurology    Complete by:  As directed    An appointment is requested in approximately: 4-6 Week(s): stroke   Ambulatory referral to Physical Therapy    Complete by:  As directed    Diet - low sodium heart healthy    Complete by:  As directed    Increase activity slowly    Complete by:  As directed        DISPOSITION: home    DISCHARGE FOLLOW-UP Follow-up Information    [facility] Follow up.   Specialty:  Rehabilitation Why:  They will contact you for the first appointment Contact information: [address] [id number] [place]c [place] [address] [phone]       Chilton Si, MD. Schedule an appointment as soon as possible for a visit in 2 week(s).   Specialty:  Cardiology Why:  cardiology Contact information: [address]  [place] [address] [phone]        Marvel Plan, MD. Schedule an appointment as soon as possible for a visit in 4 week(s).   Specialty:  Neurology Contact information: [address] [place] [address] [phone]            Time spent on  Discharge:   Signed:   Thad Ranger M.D. [facility] 09/13/2017, 9:47 AM Pager: [phone]

## 2017-09-10 NOTE — Progress Notes (Signed)
Physical Therapy Treatment Patient Details Name: Nathan Perez MRN: [id number] DOB: 22-Aug-1946 Today's Date: 09/10/2017    History of Present Illness Pt is a 71 y.o. male, smoker with c/o fall outside earlier today at about 5;30pm,  Denies dizziness, syncope, cp, palp, sob, n/v, diarrhea, brbpr.  Went inside today and fell in his room. CT-. MRI pending.    PT Comments    Pt making progress and demonstrated more stability with ambulation this session. He continues to struggle with higher level balance tasks and would benefit from OP PT services. PT will continue to follow acutely.   Follow Up Recommendations  Outpatient PT;Other (comment) (NEURO OP PT)     Equipment Recommendations  None recommended by PT    Recommendations for Other Services       Precautions / Restrictions Precautions Precautions: Fall Restrictions Weight Bearing Restrictions: No    Mobility  Bed Mobility Overal bed mobility: Modified Independent                Transfers Overall transfer level: Needs assistance Equipment used: None Transfers: Sit to/from Stand Sit to Stand: Supervision         General transfer comment: supervision for safety  Ambulation/Gait Ambulation/Gait assistance: Supervision Ambulation Distance (Feet): 75 Feet Assistive device: None Gait Pattern/deviations: Step-through pattern;Decreased step length - right;Decreased step length - left;Decreased stride length;Drifts right/left;Narrow base of support Gait velocity: decreased   General Gait Details: participated in higher level balance tasks this session; pt with improved stability with normal ambulation over flat surfaces   Stairs            Wheelchair Mobility    Modified Rankin (Stroke Patients Only) Modified Rankin (Stroke Patients Only) Pre-Morbid Rankin Score: Slight disability Modified Rankin: Moderate disability     Balance Overall balance assessment: Needs assistance Sitting-balance support:  Feet supported Sitting balance-Leahy Scale: Good     Standing balance support: During functional activity;No upper extremity supported Standing balance-Leahy Scale: Good               High level balance activites: Side stepping;Backward walking;Direction changes;Other (comment) (tandem stance, marching, circling around obstacles) High Level Balance Comments: pt with great difficulty achieving and sustaining tandem stance without UE supports, required min A; supervision for backwards walking and side stepping.            Cognition Arousal/Alertness: Awake/alert Behavior During Therapy: WFL for tasks assessed/performed Overall Cognitive Status: Within Functional Limits for tasks assessed                                        Exercises      General Comments        Pertinent Vitals/Pain Pain Assessment: No/denies pain    Home Living                      Prior Function            PT Goals (current goals can now be found in the care plan section) Acute Rehab PT Goals PT Goal Formulation: With patient Time For Goal Achievement: 09/23/17 Potential to Achieve Goals: Good Progress towards PT goals: Progressing toward goals    Frequency    Min 4X/week      PT Plan Current plan remains appropriate    Co-evaluation              AM-PAC PT "  6 Clicks" Daily Activity  Outcome Measure  Difficulty turning over in bed (including adjusting bedclothes, sheets and blankets)?: None Difficulty moving from lying on back to sitting on the side of the bed? : None Difficulty sitting down on and standing up from a chair with arms (e.g., wheelchair, bedside commode, etc,.)?: None Help needed moving to and from a bed to chair (including a wheelchair)?: None Help needed walking in hospital room?: A Little Help needed climbing 3-5 steps with a railing? : A Little 6 Click Score: 22    End of Session   Activity Tolerance: Patient tolerated  treatment well Patient left: with call bell/phone within reach;Other (comment) (in bathroom) Nurse Communication: Mobility status;Other (comment) (pt in bathroom) PT Visit Diagnosis: Other abnormalities of gait and mobility (R26.89)     Time: 1610-9604 PT Time Calculation (min) (ACUTE ONLY): 10 min  Charges:  $Gait Training: 8-22 mins                    G Codes:       [place], [place], [place] [phone]    Alessandra Bevels Almon Whitford 09/10/2017, 11:28 AM

## 2017-09-10 NOTE — Care Management Note (Signed)
Case Management Note  Patient Details  Name: [name] MRN: [id number] Date of Birth: 10/24/1946  Subjective/Objective:    Patient admitted with CVA. He is from home alone.                 Action/Plan: PT recommending Outpatient therapy. No f/u per OT. CM met with the patient and he would like to attend [facility]. Orders in EPIC and information on the AVS.   Expected Discharge Date:                  Expected Discharge Plan:  OP Rehab  In-House Referral:     Discharge planning Services  CM Consult  Post Acute Care Choice:    Choice offered to:     DME Arranged:    DME Agency:     HH Arranged:    HH Agency:     Status of Service:  In process, will continue to follow  If discussed at Long Length of Stay Meetings, dates discussed:    Additional Comments:  Pollie Friar, RN 09/10/2017, 2:03 PM

## 2017-09-10 NOTE — Consult Note (Signed)
Cardiology Consultation:   Patient ID: Nathan Perez; [id number]; 12-25-1945   Admit date: 09/09/2017 Date of Consult: 09/10/2017  Primary Care Provider: Pecola Lawless, MD Primary Cardiologist: Dr. Duke Salvia (new)    Patient Profile:   Nathan Perez is a 71 y.o. male with a hx of hypertension, hyperlipidemia, tobacco abuse, and acute stroke who is being seen today for the evaluation of systolic heart failure at the request of Dr. Thad Ranger.  History of Present Illness:   Nathan Perez was admitted 9/27 after recurrent falls.  He reported new onset of gait imbalance and falls.  He noted weakness on the right side of his body.    In the ED he was also noted to have a R facial droop and mild slurred speech.  He had a head CT that showed mild chronic microvascular ischemic changes and parenchymal volume loss.  He did not receive t-PA due to the chronicity of his symptoms.  MRI of the brain showed several small, acute infarcts in the posterior L MCA territory.  There were also possible cerebellar infarcts.  Carotids were unremarkable on MRA.  The TEE schedule was unable to accomodate him today so there were plans to discharge him for outpatient TEE and loop recorder.   An echocardiogram was ordered which showed LVEF 25-30% with severe, diffuse hypokinesis. Right ventricular function was also apparently reduced.  EKG in the ED showed atrial flutter with variable ventricular response. It was reported as sinus rhythm with PACs.  Cardiology was consulted due to newly diagnosed systolic heart failure.  Nathan Perez reports that he has been very active at home. He cuts his lawn with a push mower and has no exertional symptoms. He has not noted any lower extremity edema, orthopnea, or PND. He reports intermittent episodes of palpitations and his heart beating hard for many years. When this occurs he has not noted any lightheadedness or dizziness. He reports a 15 pound weight loss in the last month. He  denies any melena or hematochezia. He has had several colonoscopies and denies ever being told that there were any abnormalities. He continues to smoke 2 packs of cigarettes per week. This is down from 1-2 packs daily.   History reviewed. No pertinent past medical history.  Past Surgical History:  Procedure Laterality Date  . HERNIA REPAIR       Home Medications:  Prior to Admission medications   Medication Sig Start Date End Date Taking? Authorizing Provider  aspirin 325 MG tablet Take 1 tablet (325 mg total) by mouth daily. 09/10/17   Rai, Delene Ruffini, MD  atorvastatin (LIPITOR) 20 MG tablet Take 1 tablet (20 mg total) by mouth at bedtime. 09/10/17   Rai, Delene Ruffini, MD  UNABLE TO FIND Outpatient PTOT (physical therapies- neuro)  Diagnosis: acute stroke 09/10/17   Cathren Harsh, MD    Inpatient Medications: Scheduled Meds: . aspirin  300 mg Rectal Daily   Or  . aspirin  325 mg Oral Daily  . atorvastatin  20 mg Oral q1800  . feeding supplement (ENSURE ENLIVE)  237 mL Oral TID BM  . gadobenate dimeglumine  15 mL Intravenous Once   Continuous Infusions:  PRN Meds: acetaminophen **OR** acetaminophen (TYLENOL) oral liquid 160 mg/5 mL **OR** acetaminophen  Allergies:   No Known Allergies  Social History:   Social History   Social History  . Marital status: Married    Spouse name: N/A  . Number of children: N/A  . Years of  education: N/A   Occupational History  . Not on file.   Social History Main Topics  . Smoking status: Current Every Day Smoker    Packs/day: 0.50    Types: Cigarettes  . Smokeless tobacco: Never Used  . Alcohol use Yes     Comment: occasionally   . Drug use: No  . Sexual activity: Not on file   Other Topics Concern  . Not on file   Social History Narrative  . No narrative on file    Family History:    Family History  Problem Relation Age of Onset  . Dementia Mother    Daughter- diabetes No family history of stroke or heart  disease.  ROS:  Please see the history of present illness.  ROS  A 12 point review of systems was obtained and was negative with pertinent positives noted in the history of present illness.  Physical Exam/Data:   Vitals:   09/09/17 2043 09/10/17 0045 09/10/17 0515 09/10/17 0937  BP: 111/66 116/69 123/61 119/73  Pulse: 68 65 100 74  Resp: 18 18 18 20   Temp: 98.3 F (36.8 C) 98.2 F (36.8 C) 97.8 F (36.6 C) 98.1 F (36.7 C)  TempSrc: Oral Oral Oral Oral  SpO2: 100% 100% 100% 98%  Weight:      Height:        Intake/Output Summary (Last 24 hours) at 09/10/17 1643 Last data filed at 09/10/17 1400  Gross per 24 hour  Intake              360 ml  Output             1000 ml  Net             -640 ml   Filed Weights   09/08/17 2000 09/09/17 0418  Weight: 68 kg (150 lb) 67 kg (147 lb 12.8 oz)   Body mass index is 19.5 kg/m.   VS:  BP 119/73 (BP Location: Right Arm)   Pulse 74   Temp 98.1 F (36.7 C) (Oral)   Resp 20   Ht 6\' 1"  (1.854 m)   Wt 67 kg (147 lb 12.8 oz)   SpO2 98%   BMI 19.50 kg/m  , BMI Body mass index is 19.5 kg/m. GENERAL:  Frail, elderly man in no acute distress. HEENT: Pupils equal round and reactive, fundi not visualized, oral mucosa unremarkable NECK:  No jugular venous distention, waveform within normal limits, carotid upstroke brisk and symmetric, no bruits, no thyromegaly LYMPHATICS:  No cervical adenopathy LUNGS:  Clear to auscultation bilaterally HEART:  Irregularly irregular. PMI not displaced or sustained,S1 and S2 within normal limits, no S3, no S4, no clicks, no rubs, No murmurs ABD:  Flat, positive bowel sounds normal in frequency in pitch, no bruits, no rebound, no guarding, no midline pulsatile mass, no hepatomegaly, no splenomegaly EXT:  2 plus pulses throughout, no edema, no cyanosis no clubbing SKIN:  No rashes no nodules NEURO:  Cranial nerves II through XII grossly intact, motor grossly intact throughout PSYCH:  Cognitively intact,  oriented to person place and time   EKG:  The EKG was personally reviewed and demonstrates:  Atrial flutter. 3:1 A-V conduction.  Ventricular rate 105 bpm. Telemetry:  Telemetry was personally reviewed and demonstrates:  Atrial flutter. Variable ventricular response.ventricular rate ranging from the 70s to 140s.  Relevant CV Studies:  Echo 09/10/17: Study Conclusions  - Left ventricle: The cavity size was normal. Wall thickness was   normal.  Systolic function was severely reduced. The estimated   ejection fraction was in the range of 25% to 30%. Severe diffuse   hypokinesis with no identifiable regional variations. No evidence   of thrombus. - Right ventricle: The cavity size was mildly dilated. Systolic   function was moderately reduced. - Right atrium: The atrium was mildly dilated.  Laboratory Data:  Chemistry Recent Labs Lab 09/08/17 2008 09/08/17 2028 09/09/17 0925 09/10/17 0524  NA 135 138 136 136  K 3.8 3.6 3.7 4.0  CL 101 101 101 102  CO2 24  --  24 28  GLUCOSE 94 92 108* 95  BUN CREATININE 1.32* 1.20 1.16 1.20  CALCIUM 9.4  --  9.1 9.1  GFRNONAA 52*  --  >60 59*  GFRAA >60  --  >60 >60  ANIONGAP 10  --  11 6     Recent Labs Lab 09/08/17 2008  PROT 7.3  ALBUMIN 4.0  AST 23  ALT 14*  ALKPHOS 63  BILITOT 0.8   Hematology Recent Labs Lab 09/08/17 2008 09/08/17 2028 09/09/17 0925 09/10/17 0524  WBC 5.5  --  6.3 4.7  RBC 4.27  --  4.36 4.12*  HGB 13.7 16.3 14.7 13.6  HCT 42.2 48.0 43.4 40.5  MCV 98.8  --  99.5 98.3  MCH 32.1  --  33.7 33.0  MCHC 32.5  --  33.9 33.6  RDW 14.5  --  14.2 14.0  PLT 262  --  277 263   Cardiac Enzymes Recent Labs Lab 09/09/17 0351 09/09/17 0925 09/09/17 1614  TROPONINI 0.03* <0.03 <0.03    Recent Labs Lab 09/08/17 2026  TROPIPOC 0.02    BNPNo results for input(s): BNP, PROBNP in the last 168 hours.  DDimer No results for input(s): DDIMER in the last 168 hours.  Radiology/Studies:  Dg Chest 2  View  Result Date: 09/09/2017 CLINICAL DATA:  Multiple falls and difficulty swallowing. EXAM: CHEST  2 VIEW COMPARISON:  12/23/2009 chest CT and 12/02/2009 chest radiograph FINDINGS: Cardiomediastinal silhouette is unchanged. 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 There is no evidence of focal airspace disease, pulmonary edema, suspicious pulmonary nodule/mass, pleural effusion, or pneumothorax. No acute bony abnormalities are identified. IMPRESSION: No active cardiopulmonary disease. Electronically Signed   By: Harmon Pier M.D.   On: 09/09/2017 13:17   Ct Head Wo Contrast  Result Date: 09/08/2017 CLINICAL DATA:  71 y/o M; 2 days of gait problems and right arm weakness for 1 day. EXAM: CT HEAD WITHOUT CONTRAST TECHNIQUE: Contiguous axial images were obtained from the base of the skull through the vertex without intravenous contrast. COMPARISON:  None. FINDINGS: Brain: No evidence of acute infarction, hemorrhage, hydrocephalus, extra-axial collection or mass lesion/mass effect. Nonspecific foci of hypoattenuation in subcortical and periventricular white matter are compatible with mild chronic microvascular ischemic changes and there is mild parenchymal volume loss of the brain for age. Vascular: Calcific atherosclerosis of carotid siphons. No hyperdense vessel identified. Skull: Normal. Negative for fracture or focal lesion. Sinuses/Orbits: No acute finding. Other: Opacification of external auditory canals, likely cerumen. IMPRESSION: 1. No acute intracranial abnormality identified. If symptoms persist or if clinically indicated MRI is more sensitive for stroke. 2. Mild chronic microvascular ischemic changes and parenchymal volume loss of the brain for age. Electronically Signed   By: Mitzi Hansen M.D.   On: 09/08/2017 22:13   Mr Maxine Glenn Neck W Wo Contrast  Result Date: 09/09/2017 CLINICAL DATA:  71 year old male with  chorea movements of the right arm beginning 3 days  ago. EXAM: MRI HEAD WITHOUT CONTRAST MRA HEAD WITHOUT CONTRAST MRA NECK WITHOUT AND WITH CONTRAST TECHNIQUE: Multiplanar, multiecho pulse sequences of the brain and surrounding structures were obtained without intravenous contrast. Angiographic images of the Circle of Willis were obtained using MRA technique without intravenous contrast. Angiographic images of the neck were obtained using MRA technique without and with intravenous contrast. Carotid stenosis measurements (when applicable) are obtained utilizing NASCET criteria, using the distal internal carotid diameter as the denominator. CONTRAST:  15 mL MultiHance COMPARISON:  Head CT without contrast 09/08/2017. FINDINGS: MRI HEAD FINDINGS Brain: Small nodular cortical and subcortical white matter foci of restricted diffusion along the superolateral left motor strip (series 3, image 35 and series 4, image 14), and in the posterior left parietal lobe with little to no associated T2 or FLAIR hyperintensity. No contralateral right hemisphere restricted diffusion. Questionable tiny focus of restricted diffusion in the central right cerebellum (series 4, image 9). No acute or chronic intracranial hemorrhage. No mass effect. No evidence of mass lesion, ventriculomegaly, extra-axial collection. Cervicomedullary junction and pituitary are within normal limits. Patchy and confluent as well as widely scattered foci of abnormal cerebral white matter T2 and FLAIR hyperintensity in both hemispheres. No cortical encephalomalacia identified. Signal in the bilateral deep gray matter nuclei appears normal. Normal brainstem. Vascular: Major intracranial vascular flow voids are preserved. Skull and upper cervical spine: Negative. Normal bone marrow signal. Sinuses/Orbits: Normal orbits soft tissues. Scattered mild areas of paranasal sinus mucosal thickening or small retention cyst. Other: Grossly normal visible internal auditory structures. Mastoid air cells are clear. Scalp and  face soft tissues appear negative. MRA NECK FINDINGS Precontrast time-of-flight images demonstrate antegrade flow in both carotid and vertebral arteries throughout the neck. The right vertebral artery appears dominant. Post-contrast neck MRA images reveal a 3 vessel arch configuration, although the left vertebral arises very near the arch. No great vessel origin stenosis suspected. Both common carotid arteries, carotid bifurcations, and cervical ICAs are normal for age. Minimal atherosclerotic irregularity and no stenosis in the neck. The right vertebral artery is dominant and patent throughout the neck without stenosis although its origin is not well visualized. On source images the left vertebral artery is seen to arise very proximal near the aortic arch. The proximal left vertebral artery is poorly visualized on MIPS images. The left vertebral remains patent to the skullbase without significant stenosis suspected. MRA HEAD FINDINGS The distal left vertebral artery terminates in PICA. The distal right vertebral artery supplies the basilar. No distal right vertebral artery stenosis. The basilar is patent without stenosis. Fetal type bilateral PCA origins, more so the left. SCA origins are patent. Bilateral PCA branches are within normal limits. Antegrade flow in both ICA siphons. Mild siphon irregularity without stenosis. Normal ophthalmic and posterior communicating artery origins. Patent carotid termini. Normal MCA and ACA origins. Anterior communicating artery and visible ACA branches are within normal limits. Left MCA M1 segment, bifurcation, and visible left MCA branches are within normal limits. Right MCA M1 segment, bifurcation, and visible right MCA branches are within normal limits. IMPRESSION: 1. Positive for several small acute infarcts in the posterior left MCA territory, including involvement of the posterolateral left motor strip. 2. Questionable punctate acute or subacute lacunar infarct superimposed  in the right cerebellum, but this may be artifact. 3. No associated hemorrhage or mass effect. 4. Underlying moderately advanced for age but nonspecific bilateral cerebral white matter T2 and FLAIR hyperintensity. 5.  Normal noncontrast MRI appearance of the deep gray matter nuclei and brainstem. 6. Negative for age Neck MRA. 7. Negative for age intracranial MRA. Visible left MCA branches are normal. 8. Incidental normal vascular anatomic variants including dominant right vertebral artery which supplies the basilar, non dominant the left arises near the aortic arch and terminates in the left PICA, and fetal type bilateral PCA origins. Electronically Signed   By: Odessa Fleming M.D.   On: 09/09/2017 13:49   Mr Brain Wo Contrast  Result Date: 09/09/2017 CLINICAL DATA:  71 year old male with chorea movements of the right arm beginning 3 days ago. EXAM: MRI HEAD WITHOUT CONTRAST MRA HEAD WITHOUT CONTRAST MRA NECK WITHOUT AND WITH CONTRAST TECHNIQUE: Multiplanar, multiecho pulse sequences of the brain and surrounding structures were obtained without intravenous contrast. Angiographic images of the Circle of Willis were obtained using MRA technique without intravenous contrast. Angiographic images of the neck were obtained using MRA technique without and with intravenous contrast. Carotid stenosis measurements (when applicable) are obtained utilizing NASCET criteria, using the distal internal carotid diameter as the denominator. CONTRAST:  15 mL MultiHance COMPARISON:  Head CT without contrast 09/08/2017. FINDINGS: MRI HEAD FINDINGS Brain: Small nodular cortical and subcortical white matter foci of restricted diffusion along the superolateral left motor strip (series 3, image 35 and series 4, image 14), and in the posterior left parietal lobe with little to no associated T2 or FLAIR hyperintensity. No contralateral right hemisphere restricted diffusion. Questionable tiny focus of restricted diffusion in the central right  cerebellum (series 4, image 9). No acute or chronic intracranial hemorrhage. No mass effect. No evidence of mass lesion, ventriculomegaly, extra-axial collection. Cervicomedullary junction and pituitary are within normal limits. Patchy and confluent as well as widely scattered foci of abnormal cerebral white matter T2 and FLAIR hyperintensity in both hemispheres. No cortical encephalomalacia identified. Signal in the bilateral deep gray matter nuclei appears normal. Normal brainstem. Vascular: Major intracranial vascular flow voids are preserved. Skull and upper cervical spine: Negative. Normal bone marrow signal. Sinuses/Orbits: Normal orbits soft tissues. Scattered mild areas of paranasal sinus mucosal thickening or small retention cyst. Other: Grossly normal visible internal auditory structures. Mastoid air cells are clear. Scalp and face soft tissues appear negative. MRA NECK FINDINGS Precontrast time-of-flight images demonstrate antegrade flow in both carotid and vertebral arteries throughout the neck. The right vertebral artery appears dominant. Post-contrast neck MRA images reveal a 3 vessel arch configuration, although the left vertebral arises very near the arch. No great vessel origin stenosis suspected. Both common carotid arteries, carotid bifurcations, and cervical ICAs are normal for age. Minimal atherosclerotic irregularity and no stenosis in the neck. The right vertebral artery is dominant and patent throughout the neck without stenosis although its origin is not well visualized. On source images the left vertebral artery is seen to arise very proximal near the aortic arch. The proximal left vertebral artery is poorly visualized on MIPS images. The left vertebral remains patent to the skullbase without significant stenosis suspected. MRA HEAD FINDINGS The distal left vertebral artery terminates in PICA. The distal right vertebral artery supplies the basilar. No distal right vertebral artery stenosis.  The basilar is patent without stenosis. Fetal type bilateral PCA origins, more so the left. SCA origins are patent. Bilateral PCA branches are within normal limits. Antegrade flow in both ICA siphons. Mild siphon irregularity without stenosis. Normal ophthalmic and posterior communicating artery origins. Patent carotid termini. Normal MCA and ACA origins. Anterior communicating artery and visible ACA branches are  within normal limits. Left MCA M1 segment, bifurcation, and visible left MCA branches are within normal limits. Right MCA M1 segment, bifurcation, and visible right MCA branches are within normal limits. IMPRESSION: 1. Positive for several small acute infarcts in the posterior left MCA territory, including involvement of the posterolateral left motor strip. 2. Questionable punctate acute or subacute lacunar infarct superimposed in the right cerebellum, but this may be artifact. 3. No associated hemorrhage or mass effect. 4. Underlying moderately advanced for age but nonspecific bilateral cerebral white matter T2 and FLAIR hyperintensity. 5. Normal noncontrast MRI appearance of the deep gray matter nuclei and brainstem. 6. Negative for age Neck MRA. 7. Negative for age intracranial MRA. Visible left MCA branches are normal. 8. Incidental normal vascular anatomic variants including dominant right vertebral artery which supplies the basilar, non dominant the left arises near the aortic arch and terminates in the left PICA, and fetal type bilateral PCA origins. Electronically Signed   By: Odessa Fleming M.D.   On: 09/09/2017 13:49   Dg Esophagus  Result Date: 09/09/2017 CLINICAL DATA:  71 year old male with history of 20 pound weight loss. Dysphagia. Feelings of regurgitation and difficulty ingesting food for several years. EXAM: ESOPHOGRAM/BARIUM SWALLOW TECHNIQUE: Single contrast examination was performed using  thin barium. FLUOROSCOPY TIME:  Fluoroscopy Time:  1 minutes and 36 seconds Radiation Exposure Index  (if provided by the fluoroscopic device): 8.5 mGy COMPARISON:  None. FINDINGS: Limited single contrast esophagram demonstrates a structurally normal esophagus, with no esophageal mass, stricture or esophageal ring. No hiatal hernia. Intermittent failure to propagate a normal primary peristaltic wave was noted. No tertiary contractions. A 13 mm barium tablet was administered, which passed readily into the stomach. IMPRESSION: 1. Mild nonspecific esophageal motility disorder. 2. Otherwise, normal single contrast esophagram. Electronically Signed   By: Trudie Reed M.D.   On: 09/09/2017 16:06   Mr Maxine Glenn Head Wo Contrast  Result Date: 09/09/2017 CLINICAL DATA:  71 year old male with chorea movements of the right arm beginning 3 days ago. EXAM: MRI HEAD WITHOUT CONTRAST MRA HEAD WITHOUT CONTRAST MRA NECK WITHOUT AND WITH CONTRAST TECHNIQUE: Multiplanar, multiecho pulse sequences of the brain and surrounding structures were obtained without intravenous contrast. Angiographic images of the Circle of Willis were obtained using MRA technique without intravenous contrast. Angiographic images of the neck were obtained using MRA technique without and with intravenous contrast. Carotid stenosis measurements (when applicable) are obtained utilizing NASCET criteria, using the distal internal carotid diameter as the denominator. CONTRAST:  15 mL MultiHance COMPARISON:  Head CT without contrast 09/08/2017. FINDINGS: MRI HEAD FINDINGS Brain: Small nodular cortical and subcortical white matter foci of restricted diffusion along the superolateral left motor strip (series 3, image 35 and series 4, image 14), and in the posterior left parietal lobe with little to no associated T2 or FLAIR hyperintensity. No contralateral right hemisphere restricted diffusion. Questionable tiny focus of restricted diffusion in the central right cerebellum (series 4, image 9). No acute or chronic intracranial hemorrhage. No mass effect. No evidence  of mass lesion, ventriculomegaly, extra-axial collection. Cervicomedullary junction and pituitary are within normal limits. Patchy and confluent as well as widely scattered foci of abnormal cerebral white matter T2 and FLAIR hyperintensity in both hemispheres. No cortical encephalomalacia identified. Signal in the bilateral deep gray matter nuclei appears normal. Normal brainstem. Vascular: Major intracranial vascular flow voids are preserved. Skull and upper cervical spine: Negative. Normal bone marrow signal. Sinuses/Orbits: Normal orbits soft tissues. Scattered mild areas of paranasal sinus mucosal  thickening or small retention cyst. Other: Grossly normal visible internal auditory structures. Mastoid air cells are clear. Scalp and face soft tissues appear negative. MRA NECK FINDINGS Precontrast time-of-flight images demonstrate antegrade flow in both carotid and vertebral arteries throughout the neck. The right vertebral artery appears dominant. Post-contrast neck MRA images reveal a 3 vessel arch configuration, although the left vertebral arises very near the arch. No great vessel origin stenosis suspected. Both common carotid arteries, carotid bifurcations, and cervical ICAs are normal for age. Minimal atherosclerotic irregularity and no stenosis in the neck. The right vertebral artery is dominant and patent throughout the neck without stenosis although its origin is not well visualized. On source images the left vertebral artery is seen to arise very proximal near the aortic arch. The proximal left vertebral artery is poorly visualized on MIPS images. The left vertebral remains patent to the skullbase without significant stenosis suspected. MRA HEAD FINDINGS The distal left vertebral artery terminates in PICA. The distal right vertebral artery supplies the basilar. No distal right vertebral artery stenosis. The basilar is patent without stenosis. Fetal type bilateral PCA origins, more so the left. SCA origins  are patent. Bilateral PCA branches are within normal limits. Antegrade flow in both ICA siphons. Mild siphon irregularity without stenosis. Normal ophthalmic and posterior communicating artery origins. Patent carotid termini. Normal MCA and ACA origins. Anterior communicating artery and visible ACA branches are within normal limits. Left MCA M1 segment, bifurcation, and visible left MCA branches are within normal limits. Right MCA M1 segment, bifurcation, and visible right MCA branches are within normal limits. IMPRESSION: 1. Positive for several small acute infarcts in the posterior left MCA territory, including involvement of the posterolateral left motor strip. 2. Questionable punctate acute or subacute lacunar infarct superimposed in the right cerebellum, but this may be artifact. 3. No associated hemorrhage or mass effect. 4. Underlying moderately advanced for age but nonspecific bilateral cerebral white matter T2 and FLAIR hyperintensity. 5. Normal noncontrast MRI appearance of the deep gray matter nuclei and brainstem. 6. Negative for age Neck MRA. 7. Negative for age intracranial MRA. Visible left MCA branches are normal. 8. Incidental normal vascular anatomic variants including dominant right vertebral artery which supplies the basilar, non dominant the left arises near the aortic arch and terminates in the left PICA, and fetal type bilateral PCA origins. Electronically Signed   By: Odessa Fleming M.D.   On: 09/09/2017 13:49    Assessment and Plan:   # Systolic and diastolic heart failure: Chronicity unknown: Nathan Perez is euvolemic and asymptomatic. It is unclear whether this is tachycardia induced or another cause. His heart rate has been mostly well-controlled but occasionally goes to the 140s. We will start metoprolol succinate 25mg  daily.  Will start ARB if/when BP allows. We will get a [employer].    # Atrial flutter:  Needs TEE/DCCV.  However given this acute stroke I would wait at  least 4 weeks prior to cardioversion. Plan for outpatient TEE/DCCV in 4 weeks.  No need for loop recorder as he has already been in atrial flutter here.  Start heparin for now.  Will likely start a DOAC if he doesn't need cath.   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  Start heparin and switch to oral anticoagulation after echo.  Switch aspirin to 81mg  given that we are starting heparin.    For questions or updates, please contact [facility] Please consult [email or web address] for contact info under Cardiology/STEMI.   Signed, Chilton Si, MD  09/10/2017 4:43 PM

## 2017-09-10 NOTE — Progress Notes (Signed)
ANTICOAGULATION CONSULT NOTE - Initial Consult  Pharmacy Consult:  Heparin Indication: Aflutter  No Known Allergies  Patient Measurements: Height:  (185.4 cm) Weight: 147 lb 12.8 oz (67 kg) IBW/kg (Calculated) : 79.9 Heparin Dosing Weight: 67 kg  Vital Signs: Temp: 98.1 F (36.7 C) (09/28 0937) Temp Source: Oral (09/28 0937) BP: 119/73 (09/28 0937) Pulse Rate: 74 (09/28 0937)  Labs:  Recent Labs  09/08/17 2008 09/08/17 2028 09/09/17 0351 09/09/17 0925 09/09/17 1614 09/10/17 0524  HGB 13.7 16.3  --  14.7  --  13.6  HCT 42.2 48.0  --  43.4  --  40.5  PLT 262  --   --  277  --  263  APTT 32  --   --   --   --   --   LABPROT 13.1  --   --   --   --   --   INR 1.00  --   --   --   --   --   CREATININE 1.32* 1.20  --  1.16  --  1.20  TROPONINI  --   --  0.03* <0.03 <0.03  --     Estimated Creatinine Clearance: 52.7 mL/min (by C-G formula based on SCr of 1.2 mg/dL).   Medical History: History reviewed. No pertinent past medical history.    Assessment: 75 YOM presented s/p fall x 2, found to have new CVA and Aflutter.  CT negative for bleeding.  Pharmacy consulted to initiate IV heparin with plan to transition to a [facility] if no cath.  Baseline labs reviewed.   Goal of Therapy:  Heparin level 0.3-0.7 units/ml, aim low d/t CVA Monitor platelets by anticoagulation protocol: Yes    Plan:  Heparin gtt at 950 units/hr, no bolus with CVA Check 8 hr heparin level Daily heparin level and CBC   [place] Petron D. Laney Potash, PharmD, BCPS Pager:  [phone] 09/10/2017, 6:24 PM

## 2017-09-11 ENCOUNTER — Observation Stay (HOSPITAL_COMMUNITY): Payer: Medicare PPO

## 2017-09-11 DIAGNOSIS — R296 Repeated falls: Secondary | ICD-10-CM

## 2017-09-11 DIAGNOSIS — I110 Hypertensive heart disease with heart failure: Secondary | ICD-10-CM | POA: Diagnosis present

## 2017-09-11 DIAGNOSIS — R29898 Other symptoms and signs involving the musculoskeletal system: Secondary | ICD-10-CM

## 2017-09-11 DIAGNOSIS — F17210 Nicotine dependence, cigarettes, uncomplicated: Secondary | ICD-10-CM | POA: Diagnosis present

## 2017-09-11 DIAGNOSIS — I5042 Chronic combined systolic (congestive) and diastolic (congestive) heart failure: Secondary | ICD-10-CM | POA: Diagnosis present

## 2017-09-11 DIAGNOSIS — R29702 NIHSS score 2: Secondary | ICD-10-CM | POA: Diagnosis present

## 2017-09-11 DIAGNOSIS — I639 Cerebral infarction, unspecified: Secondary | ICD-10-CM | POA: Diagnosis not present

## 2017-09-11 DIAGNOSIS — R1310 Dysphagia, unspecified: Secondary | ICD-10-CM | POA: Diagnosis not present

## 2017-09-11 DIAGNOSIS — I483 Typical atrial flutter: Secondary | ICD-10-CM | POA: Diagnosis not present

## 2017-09-11 DIAGNOSIS — E785 Hyperlipidemia, unspecified: Secondary | ICD-10-CM | POA: Diagnosis present

## 2017-09-11 DIAGNOSIS — R29810 Facial weakness: Secondary | ICD-10-CM | POA: Diagnosis not present

## 2017-09-11 DIAGNOSIS — I10 Essential (primary) hypertension: Secondary | ICD-10-CM

## 2017-09-11 DIAGNOSIS — I4892 Unspecified atrial flutter: Secondary | ICD-10-CM | POA: Diagnosis present

## 2017-09-11 DIAGNOSIS — I5022 Chronic systolic (congestive) heart failure: Secondary | ICD-10-CM | POA: Diagnosis not present

## 2017-09-11 DIAGNOSIS — I429 Cardiomyopathy, unspecified: Secondary | ICD-10-CM | POA: Diagnosis present

## 2017-09-11 DIAGNOSIS — G629 Polyneuropathy, unspecified: Secondary | ICD-10-CM | POA: Diagnosis present

## 2017-09-11 DIAGNOSIS — R000 Tachycardia, unspecified: Secondary | ICD-10-CM | POA: Diagnosis present

## 2017-09-11 DIAGNOSIS — W19XXXA Unspecified fall, initial encounter: Secondary | ICD-10-CM | POA: Diagnosis not present

## 2017-09-11 DIAGNOSIS — I255 Ischemic cardiomyopathy: Secondary | ICD-10-CM | POA: Diagnosis present

## 2017-09-11 DIAGNOSIS — I493 Ventricular premature depolarization: Secondary | ICD-10-CM | POA: Diagnosis present

## 2017-09-11 DIAGNOSIS — R471 Dysarthria and anarthria: Secondary | ICD-10-CM | POA: Diagnosis present

## 2017-09-11 DIAGNOSIS — I63412 Cerebral infarction due to embolism of left middle cerebral artery: Secondary | ICD-10-CM | POA: Diagnosis present

## 2017-09-11 LAB — CBC
HCT: 40.4 % (ref 39.0–52.0)
Hemoglobin: 13.4 g/dL (ref 13.0–17.0)
MCH: 32.6 pg (ref 26.0–34.0)
MCHC: 33.2 g/dL (ref 30.0–36.0)
MCV: 98.3 fL (ref 78.0–100.0)
PLATELETS: 305 10*3/uL (ref 150–400)
RBC: 4.11 MIL/uL — AB (ref 4.22–5.81)
RDW: 13.9 % (ref 11.5–15.5)
WBC: 4.8 10*3/uL (ref 4.0–10.5)

## 2017-09-11 LAB — HEPARIN LEVEL (UNFRACTIONATED)
HEPARIN UNFRACTIONATED: 0.81 [IU]/mL — AB (ref 0.30–0.70)
Heparin Unfractionated: 0.45 IU/mL (ref 0.30–0.70)

## 2017-09-11 LAB — BASIC METABOLIC PANEL
Anion gap: 9 (ref 5–15)
BUN: 10 mg/dL (ref 6–20)
CALCIUM: 9.4 mg/dL (ref 8.9–10.3)
CHLORIDE: 100 mmol/L — AB (ref 101–111)
CO2: 29 mmol/L (ref 22–32)
CREATININE: 1.16 mg/dL (ref 0.61–1.24)
GFR calc Af Amer: >60 mL/min (ref >60)
GLUCOSE: 97 mg/dL (ref 65–99)
Potassium: 4 mmol/L (ref 3.5–5.1)
Sodium: 138 mmol/L (ref 135–145)

## 2017-09-11 LAB — NM MYOCAR MULTI W/SPECT W/WALL MOTION / EF
CHL CUP MPHR: 148 {beats}/min
CHL CUP RESTING HR STRESS: 96 {beats}/min
CSEPED: 0 min
CSEPHR: 68 %
CSEPPHR: 102 {beats}/min
Estimated workload: 1 METS
Exercise duration (sec): 0 s

## 2017-09-11 MED ORDER — TECHNETIUM TC 99M TETROFOSMIN IV KIT
30.0000 | PACK | Freq: Once | INTRAVENOUS | Status: AC | PRN
  Administered 2017-09-11: 30 via INTRAVENOUS

## 2017-09-11 MED ORDER — REGADENOSON 0.4 MG/5ML IV SOLN
0.4000 mg | Freq: Once | INTRAVENOUS | Status: AC
  Administered 2017-09-11: 0.4 mg via INTRAVENOUS
  Filled 2017-09-11: qty 5

## 2017-09-11 MED ORDER — REGADENOSON 0.4 MG/5ML IV SOLN
INTRAVENOUS | Status: AC
  Filled 2017-09-11: qty 5

## 2017-09-11 MED ORDER — LOSARTAN POTASSIUM 50 MG PO TABS
25.0000 mg | ORAL_TABLET | Freq: Every day | ORAL | Status: DC
  Administered 2017-09-11 – 2017-09-13 (×3): 25 mg via ORAL
  Filled 2017-09-11 (×3): qty 1

## 2017-09-11 MED ORDER — TECHNETIUM TC 99M TETROFOSMIN IV KIT
10.0000 | PACK | Freq: Once | INTRAVENOUS | Status: AC | PRN
  Administered 2017-09-11: 10 via INTRAVENOUS

## 2017-09-11 NOTE — Progress Notes (Signed)
[facility]                                                                              Patient Demographics  Nathan Perez, is a 71 y.o. male, DOB - 1946/02/03, ZOX:[id number]  Admit date - 09/09/2017   Admitting Physician Pearson Grippe, MD  Outpatient Primary MD for the patient is Pecola Lawless, MD  Outpatient specialists:   LOS - 1  days   Medical records reviewed and are as summarized below:    Chief Complaint  Patient presents with  . Weakness       Brief summary   Patient is a 71 year old male who had a fall on the day of admission at about 5:30 PM. Subsequently went inside and fell in his room again. Patient was brought to the ED by his family. Initial CT head was negative, telemetry showed atrial flutter 85-108. Patient was admitted due to the concern for CVA   Assessment & Plan    Principal Problem:   Acute CVA (cerebrovascular accident) (HCC)presenting with falls - MRI of the brain showed positive several small acute infarcts in the posterior left MCA territory including involvement of the posterior lateral left motor strip, question about punctate acute or subacute lacunar infarct superimposed in the right cerebellum, may be artifact. No associated hemorrhage or mass effect. - Negative intracranial MRA head and neck - placed on aspirin 325 mg daily, Lipitor - PT OT eval recommended outpatient OT  - LDL 88, on statin, hemoglobin A1c 5.1 - doppler US showed no LE DVT  - 2-D echo showed EF of 25-30% with diffuse hypokinesis, called cardiology consult. Plan for nuclear medicine stress test today.  Active Problems:   Hypertension -BP stable, not on any antihypertensives outpatient     Atrial flutter (HCC) - per cardiology, needs TEE/DCCV, due to acute stroke would wait at least 4 weeks prior to cardioversion, plan outpatient - No need for loop recorder, started on IV heparin drip. Will likely need NOAC if he doesn't need cath. - TEE planned on  Monday    Hyperlipidemia - lDL 88, goal less than 70, placed on statin   ?dysphagia: - esophagogram showed no obstruction, patient eating without difficulty  Code Status: full code  DVT Prophylaxis:   SCD's Family Communication: Discussed in detail with the patient, all imaging results, lab results explained to the patient and daughter   Disposition Plan: pending cardiology evaluation   Time Spent in minutes  25 minutes  Procedures:  MRI brain, MRA head and neck  Consultants:   Neurology Cardiology   Antimicrobials:      Medications  Scheduled Meds: . regadenoson      . aspirin EC  81 mg Oral Daily  . atorvastatin  20 mg Oral q1800  . feeding supplement (ENSURE ENLIVE)  237 mL Oral TID BM  . gadobenate dimeglumine  15 mL Intravenous Once  . losartan  25 mg Oral Daily  . metoprolol succinate  25 mg Oral Daily   Continuous Infusions: . heparin 950 Units/hr (09/10/17 1843)   PRN Meds:.acetaminophen **OR** acetaminophen (TYLENOL) oral liquid 160 mg/5 mL **OR** acetaminophen  Antibiotics   Anti-infectives    None        Subjective:   Mikle Sternberg was seen and examined today. No complaints. Patient denies dizziness, chest pain, shortness of breath, abdominal pain, N/V/D/C, new weakness, numbess, tingling.   Objective:   Vitals:   09/11/17 1021 09/11/17 1027 09/11/17 1029 09/11/17 1030  BP: (!) 148/95 (!) 160/99 (!) 157/91 (!) 149/87  Pulse:      Resp:      Temp:      TempSrc:      SpO2:      Weight:      Height:        Intake/Output Summary (Last 24 hours) at 09/11/17 1147 Last data filed at 09/11/17 0300  Gross per 24 hour  Intake           198.69 ml  Output                0 ml  Net           198.69 ml     Wt Readings from Last 3 Encounters:  09/09/17 67 kg (147 lb 12.8 oz)  11/26/09 75.6 kg (166 lb 9.6 oz)     Exam   General: Alert and oriented x 3, NAD  Eyes:   HEENT:  Atraumatic, normocephalic  Cardiovascular: S1 S2  auscultated, irregularly irregular, No pedal edema b/l  Respiratory: Clear to auscultation bilaterally, no wheezing, rales or rhonchi  Gastrointestinal: Soft, nontender, nondistended, + bowel sounds  Ext: no pedal edema bilaterally  Neuro: AAOx3, Cr N's II- XII. Strength 5/5 upper and lower extremities bilaterally, speech clear, sensations grossly intact  Musculoskeletal: No digital cyanosis, clubbing  Skin: No rashes  Psych: Normal affect and demeanor, alert and oriented x3     Data Reviewed:  I have personally reviewed following labs and imaging studies  Micro Results No results found for this or any previous visit (from the past 240 hour(s)).  Radiology Reports Dg Chest 2 View  Result Date: 09/09/2017 CLINICAL DATA:  Multiple falls and difficulty swallowing. EXAM: CHEST  2 VIEW COMPARISON:  12/23/2009 chest CT and 12/02/2009 chest radiograph FINDINGS: Cardiomediastinal silhouette is unchanged. 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 There is no evidence of focal airspace disease, pulmonary edema, suspicious pulmonary nodule/mass, pleural effusion, or pneumothorax. No acute bony abnormalities are identified. IMPRESSION: No active cardiopulmonary disease. Electronically Signed   By: Harmon Pier M.D.   On: 09/09/2017 13:17   Ct Head Wo Contrast  Result Date: 09/08/2017 CLINICAL DATA:  71 y/o M; 2 days of gait problems and right arm weakness for 1 day. EXAM: CT HEAD WITHOUT CONTRAST TECHNIQUE: Contiguous axial images were obtained from the base of the skull through the vertex without intravenous contrast. COMPARISON:  None. FINDINGS: Brain: No evidence of acute infarction, hemorrhage, hydrocephalus, extra-axial collection or mass lesion/mass effect. Nonspecific foci of hypoattenuation in subcortical and periventricular white matter are compatible with mild chronic microvascular ischemic changes and there is mild parenchymal volume loss of the brain for age.  Vascular: Calcific atherosclerosis of carotid siphons. No hyperdense vessel identified. Skull: Normal. Negative for fracture or focal lesion. Sinuses/Orbits: No acute finding. Other: Opacification of external auditory canals, likely cerumen. IMPRESSION: 1. No acute intracranial abnormality identified. If symptoms persist or if clinically indicated MRI is more sensitive for stroke. 2. Mild chronic microvascular ischemic changes and parenchymal volume loss of the brain for age. Electronically Signed   By: Buzzy Han.D.  On: 09/08/2017 22:13   Mr Maxine Glenn Neck W Wo Contrast  Result Date: 09/09/2017 CLINICAL DATA:  71 year old male with chorea movements of the right arm beginning 3 days ago. EXAM: MRI HEAD WITHOUT CONTRAST MRA HEAD WITHOUT CONTRAST MRA NECK WITHOUT AND WITH CONTRAST TECHNIQUE: Multiplanar, multiecho pulse sequences of the brain and surrounding structures were obtained without intravenous contrast. Angiographic images of the Circle of Willis were obtained using MRA technique without intravenous contrast. Angiographic images of the neck were obtained using MRA technique without and with intravenous contrast. Carotid stenosis measurements (when applicable) are obtained utilizing NASCET criteria, using the distal internal carotid diameter as the denominator. CONTRAST:  15 mL MultiHance COMPARISON:  Head CT without contrast 09/08/2017. FINDINGS: MRI HEAD FINDINGS Brain: Small nodular cortical and subcortical white matter foci of restricted diffusion along the superolateral left motor strip (series 3, image 35 and series 4, image 14), and in the posterior left parietal lobe with little to no associated T2 or FLAIR hyperintensity. No contralateral right hemisphere restricted diffusion. Questionable tiny focus of restricted diffusion in the central right cerebellum (series 4, image 9). No acute or chronic intracranial hemorrhage. No mass effect. No evidence of mass lesion, ventriculomegaly,  extra-axial collection. Cervicomedullary junction and pituitary are within normal limits. Patchy and confluent as well as widely scattered foci of abnormal cerebral white matter T2 and FLAIR hyperintensity in both hemispheres. No cortical encephalomalacia identified. Signal in the bilateral deep gray matter nuclei appears normal. Normal brainstem. Vascular: Major intracranial vascular flow voids are preserved. Skull and upper cervical spine: Negative. Normal bone marrow signal. Sinuses/Orbits: Normal orbits soft tissues. Scattered mild areas of paranasal sinus mucosal thickening or small retention cyst. Other: Grossly normal visible internal auditory structures. Mastoid air cells are clear. Scalp and face soft tissues appear negative. MRA NECK FINDINGS Precontrast time-of-flight images demonstrate antegrade flow in both carotid and vertebral arteries throughout the neck. The right vertebral artery appears dominant. Post-contrast neck MRA images reveal a 3 vessel arch configuration, although the left vertebral arises very near the arch. No great vessel origin stenosis suspected. Both common carotid arteries, carotid bifurcations, and cervical ICAs are normal for age. Minimal atherosclerotic irregularity and no stenosis in the neck. The right vertebral artery is dominant and patent throughout the neck without stenosis although its origin is not well visualized. On source images the left vertebral artery is seen to arise very proximal near the aortic arch. The proximal left vertebral artery is poorly visualized on MIPS images. The left vertebral remains patent to the skullbase without significant stenosis suspected. MRA HEAD FINDINGS The distal left vertebral artery terminates in PICA. The distal right vertebral artery supplies the basilar. No distal right vertebral artery stenosis. The basilar is patent without stenosis. Fetal type bilateral PCA origins, more so the left. SCA origins are patent. Bilateral PCA branches  are within normal limits. Antegrade flow in both ICA siphons. Mild siphon irregularity without stenosis. Normal ophthalmic and posterior communicating artery origins. Patent carotid termini. Normal MCA and ACA origins. Anterior communicating artery and visible ACA branches are within normal limits. Left MCA M1 segment, bifurcation, and visible left MCA branches are within normal limits. Right MCA M1 segment, bifurcation, and visible right MCA branches are within normal limits. IMPRESSION: 1. Positive for several small acute infarcts in the posterior left MCA territory, including involvement of the posterolateral left motor strip. 2. Questionable punctate acute or subacute lacunar infarct superimposed in the right cerebellum, but this may be artifact. 3. No associated hemorrhage  or mass effect. 4. Underlying moderately advanced for age but nonspecific bilateral cerebral white matter T2 and FLAIR hyperintensity. 5. Normal noncontrast MRI appearance of the deep gray matter nuclei and brainstem. 6. Negative for age Neck MRA. 7. Negative for age intracranial MRA. Visible left MCA branches are normal. 8. Incidental normal vascular anatomic variants including dominant right vertebral artery which supplies the basilar, non dominant the left arises near the aortic arch and terminates in the left PICA, and fetal type bilateral PCA origins. Electronically Signed   By: Odessa Fleming M.D.   On: 09/09/2017 13:49   Mr Brain Wo Contrast  Result Date: 09/09/2017 CLINICAL DATA:  71 year old male with chorea movements of the right arm beginning 3 days ago. EXAM: MRI HEAD WITHOUT CONTRAST MRA HEAD WITHOUT CONTRAST MRA NECK WITHOUT AND WITH CONTRAST TECHNIQUE: Multiplanar, multiecho pulse sequences of the brain and surrounding structures were obtained without intravenous contrast. Angiographic images of the Circle of Willis were obtained using MRA technique without intravenous contrast. Angiographic images of the neck were obtained using  MRA technique without and with intravenous contrast. Carotid stenosis measurements (when applicable) are obtained utilizing NASCET criteria, using the distal internal carotid diameter as the denominator. CONTRAST:  15 mL MultiHance COMPARISON:  Head CT without contrast 09/08/2017. FINDINGS: MRI HEAD FINDINGS Brain: Small nodular cortical and subcortical white matter foci of restricted diffusion along the superolateral left motor strip (series 3, image 35 and series 4, image 14), and in the posterior left parietal lobe with little to no associated T2 or FLAIR hyperintensity. No contralateral right hemisphere restricted diffusion. Questionable tiny focus of restricted diffusion in the central right cerebellum (series 4, image 9). No acute or chronic intracranial hemorrhage. No mass effect. No evidence of mass lesion, ventriculomegaly, extra-axial collection. Cervicomedullary junction and pituitary are within normal limits. Patchy and confluent as well as widely scattered foci of abnormal cerebral white matter T2 and FLAIR hyperintensity in both hemispheres. No cortical encephalomalacia identified. Signal in the bilateral deep gray matter nuclei appears normal. Normal brainstem. Vascular: Major intracranial vascular flow voids are preserved. Skull and upper cervical spine: Negative. Normal bone marrow signal. Sinuses/Orbits: Normal orbits soft tissues. Scattered mild areas of paranasal sinus mucosal thickening or small retention cyst. Other: Grossly normal visible internal auditory structures. Mastoid air cells are clear. Scalp and face soft tissues appear negative. MRA NECK FINDINGS Precontrast time-of-flight images demonstrate antegrade flow in both carotid and vertebral arteries throughout the neck. The right vertebral artery appears dominant. Post-contrast neck MRA images reveal a 3 vessel arch configuration, although the left vertebral arises very near the arch. No great vessel origin stenosis suspected. Both  common carotid arteries, carotid bifurcations, and cervical ICAs are normal for age. Minimal atherosclerotic irregularity and no stenosis in the neck. The right vertebral artery is dominant and patent throughout the neck without stenosis although its origin is not well visualized. On source images the left vertebral artery is seen to arise very proximal near the aortic arch. The proximal left vertebral artery is poorly visualized on MIPS images. The left vertebral remains patent to the skullbase without significant stenosis suspected. MRA HEAD FINDINGS The distal left vertebral artery terminates in PICA. The distal right vertebral artery supplies the basilar. No distal right vertebral artery stenosis. The basilar is patent without stenosis. Fetal type bilateral PCA origins, more so the left. SCA origins are patent. Bilateral PCA branches are within normal limits. Antegrade flow in both ICA siphons. Mild siphon irregularity without stenosis. Normal ophthalmic and  posterior communicating artery origins. Patent carotid termini. Normal MCA and ACA origins. Anterior communicating artery and visible ACA branches are within normal limits. Left MCA M1 segment, bifurcation, and visible left MCA branches are within normal limits. Right MCA M1 segment, bifurcation, and visible right MCA branches are within normal limits. IMPRESSION: 1. Positive for several small acute infarcts in the posterior left MCA territory, including involvement of the posterolateral left motor strip. 2. Questionable punctate acute or subacute lacunar infarct superimposed in the right cerebellum, but this may be artifact. 3. No associated hemorrhage or mass effect. 4. Underlying moderately advanced for age but nonspecific bilateral cerebral white matter T2 and FLAIR hyperintensity. 5. Normal noncontrast MRI appearance of the deep gray matter nuclei and brainstem. 6. Negative for age Neck MRA. 7. Negative for age intracranial MRA. Visible left MCA  branches are normal. 8. Incidental normal vascular anatomic variants including dominant right vertebral artery which supplies the basilar, non dominant the left arises near the aortic arch and terminates in the left PICA, and fetal type bilateral PCA origins. Electronically Signed   By: Odessa Fleming M.D.   On: 09/09/2017 13:49   Dg Esophagus  Result Date: 09/09/2017 CLINICAL DATA:  71 year old male with history of 20 pound weight loss. Dysphagia. Feelings of regurgitation and difficulty ingesting food for several years. EXAM: ESOPHOGRAM/BARIUM SWALLOW TECHNIQUE: Single contrast examination was performed using  thin barium. FLUOROSCOPY TIME:  Fluoroscopy Time:  1 minutes and 36 seconds Radiation Exposure Index (if provided by the fluoroscopic device): 8.5 mGy COMPARISON:  None. FINDINGS: Limited single contrast esophagram demonstrates a structurally normal esophagus, with no esophageal mass, stricture or esophageal ring. No hiatal hernia. Intermittent failure to propagate a normal primary peristaltic wave was noted. No tertiary contractions. A 13 mm barium tablet was administered, which passed readily into the stomach. IMPRESSION: 1. Mild nonspecific esophageal motility disorder. 2. Otherwise, normal single contrast esophagram. Electronically Signed   By: Trudie Reed M.D.   On: 09/09/2017 16:06   Mr Maxine Glenn Head Wo Contrast  Result Date: 09/09/2017 CLINICAL DATA:  71 year old male with chorea movements of the right arm beginning 3 days ago. EXAM: MRI HEAD WITHOUT CONTRAST MRA HEAD WITHOUT CONTRAST MRA NECK WITHOUT AND WITH CONTRAST TECHNIQUE: Multiplanar, multiecho pulse sequences of the brain and surrounding structures were obtained without intravenous contrast. Angiographic images of the Circle of Willis were obtained using MRA technique without intravenous contrast. Angiographic images of the neck were obtained using MRA technique without and with intravenous contrast. Carotid stenosis measurements (when  applicable) are obtained utilizing NASCET criteria, using the distal internal carotid diameter as the denominator. CONTRAST:  15 mL MultiHance COMPARISON:  Head CT without contrast 09/08/2017. FINDINGS: MRI HEAD FINDINGS Brain: Small nodular cortical and subcortical white matter foci of restricted diffusion along the superolateral left motor strip (series 3, image 35 and series 4, image 14), and in the posterior left parietal lobe with little to no associated T2 or FLAIR hyperintensity. No contralateral right hemisphere restricted diffusion. Questionable tiny focus of restricted diffusion in the central right cerebellum (series 4, image 9). No acute or chronic intracranial hemorrhage. No mass effect. No evidence of mass lesion, ventriculomegaly, extra-axial collection. Cervicomedullary junction and pituitary are within normal limits. Patchy and confluent as well as widely scattered foci of abnormal cerebral white matter T2 and FLAIR hyperintensity in both hemispheres. No cortical encephalomalacia identified. Signal in the bilateral deep gray matter nuclei appears normal. Normal brainstem. Vascular: Major intracranial vascular flow voids are preserved. Skull and  upper cervical spine: Negative. Normal bone marrow signal. Sinuses/Orbits: Normal orbits soft tissues. Scattered mild areas of paranasal sinus mucosal thickening or small retention cyst. Other: Grossly normal visible internal auditory structures. Mastoid air cells are clear. Scalp and face soft tissues appear negative. MRA NECK FINDINGS Precontrast time-of-flight images demonstrate antegrade flow in both carotid and vertebral arteries throughout the neck. The right vertebral artery appears dominant. Post-contrast neck MRA images reveal a 3 vessel arch configuration, although the left vertebral arises very near the arch. No great vessel origin stenosis suspected. Both common carotid arteries, carotid bifurcations, and cervical ICAs are normal for age. Minimal  atherosclerotic irregularity and no stenosis in the neck. The right vertebral artery is dominant and patent throughout the neck without stenosis although its origin is not well visualized. On source images the left vertebral artery is seen to arise very proximal near the aortic arch. The proximal left vertebral artery is poorly visualized on MIPS images. The left vertebral remains patent to the skullbase without significant stenosis suspected. MRA HEAD FINDINGS The distal left vertebral artery terminates in PICA. The distal right vertebral artery supplies the basilar. No distal right vertebral artery stenosis. The basilar is patent without stenosis. Fetal type bilateral PCA origins, more so the left. SCA origins are patent. Bilateral PCA branches are within normal limits. Antegrade flow in both ICA siphons. Mild siphon irregularity without stenosis. Normal ophthalmic and posterior communicating artery origins. Patent carotid termini. Normal MCA and ACA origins. Anterior communicating artery and visible ACA branches are within normal limits. Left MCA M1 segment, bifurcation, and visible left MCA branches are within normal limits. Right MCA M1 segment, bifurcation, and visible right MCA branches are within normal limits. IMPRESSION: 1. Positive for several small acute infarcts in the posterior left MCA territory, including involvement of the posterolateral left motor strip. 2. Questionable punctate acute or subacute lacunar infarct superimposed in the right cerebellum, but this may be artifact. 3. No associated hemorrhage or mass effect. 4. Underlying moderately advanced for age but nonspecific bilateral cerebral white matter T2 and FLAIR hyperintensity. 5. Normal noncontrast MRI appearance of the deep gray matter nuclei and brainstem. 6. Negative for age Neck MRA. 7. Negative for age intracranial MRA. Visible left MCA branches are normal. 8. Incidental normal vascular anatomic variants including dominant right  vertebral artery which supplies the basilar, non dominant the left arises near the aortic arch and terminates in the left PICA, and fetal type bilateral PCA origins. Electronically Signed   By: Odessa Fleming M.D.   On: 09/09/2017 13:49    Lab Data:  CBC:  Recent Labs Lab 09/08/17 2008 09/08/17 2028 09/09/17 0925 09/10/17 0524 09/11/17 0332  WBC 5.5  --  6.3 4.7 4.8  NEUTROABS 3.4  --   --   --   --   HGB 13.7 16.3 14.7 13.6 13.4  HCT 42.2 48.0 43.4 40.5 40.4  MCV 98.8  --  99.5 98.3 98.3  PLT 262  --  277 263 305   Basic Metabolic Panel:  Recent Labs Lab 09/08/17 2008 09/08/17 2028 09/09/17 0925 09/10/17 0524 09/11/17 0332  NA 135 138 136 136 138  K 3.8 3.6 3.7 4.0 4.0  CL 101 101 101 102 100*  CO2 24  --  24 28 29   GLUCOSE 94 92 108* 95 97  BUN 6 7 7 9 10   CREATININE 1.32* 1.20 1.16 1.20 1.16  CALCIUM 9.4  --  9.1 9.1 9.4   GFR: Estimated Creatinine Clearance: 54.5  mL/min (by C-G formula based on SCr of 1.16 mg/dL). Liver Function Tests:  Recent Labs Lab 09/08/17 2008  AST 23  ALT 14*  ALKPHOS 63  BILITOT 0.8  PROT 7.3  ALBUMIN 4.0   No results for input(s): LIPASE, AMYLASE in the last 168 hours. No results for input(s): AMMONIA in the last 168 hours. Coagulation Profile:  Recent Labs Lab 09/08/17 2008  INR 1.00   Cardiac Enzymes:  Recent Labs Lab 09/09/17 0351 09/09/17 0925 09/09/17 1614  TROPONINI 0.03* <0.03 <0.03   BNP (last 3 results) No results for input(s): PROBNP in the last 8760 hours. HbA1C:  Recent Labs  09/09/17 0925  HGBA1C 5.1   CBG: No results for input(s): GLUCAP in the last 168 hours. Lipid Profile:  Recent Labs  09/09/17 0925  CHOL 164  HDL 64  LDLCALC 88  TRIG 60  CHOLHDL 2.6   Thyroid Function Tests:  Recent Labs  09/09/17 0351  TSH 3.672   Anemia Panel: No results for input(s): VITAMINB12, FOLATE, FERRITIN, TIBC, IRON, RETICCTPCT in the last 72 hours. Urine analysis:    Component Value Date/Time    COLORURINE STRAW (A) 09/08/2017 2045   APPEARANCEUR CLEAR 09/08/2017 2045   LABSPEC 1.004 (L) 09/08/2017 2045   PHURINE 5.0 09/08/2017 2045   GLUCOSEU NEGATIVE 09/08/2017 2045   HGBUR LARGE (A) 09/08/2017 2045   BILIRUBINUR NEGATIVE 09/08/2017 2045   KETONESUR NEGATIVE 09/08/2017 2045   PROTEINUR NEGATIVE 09/08/2017 2045   UROBILINOGEN 1.0 04/28/2013 1205   NITRITE NEGATIVE 09/08/2017 2045   LEUKOCYTESUR NEGATIVE 09/08/2017 2045     Lazar Tierce M.D. [facility] 09/11/2017, 11:47 AM  Pager: [phone] Between 7am to 7pm - call Pager - [phone]  After 7pm go to [email or web address] - password [email or web address]  Call night coverage person covering after 7pm

## 2017-09-11 NOTE — Progress Notes (Signed)
ANTICOAGULATION CONSULT NOTE - Follow Up Consult  Pharmacy Consult for Heparin  Indication: Atrial flutter/CVA  No Known Allergies  Patient Measurements: Height:  (185.4 cm) Weight: 147 lb 12.8 oz (67 kg) IBW/kg (Calculated) : 79.9  Vital Signs: Temp: 97.8 F (36.6 C) (09/29 0144) Temp Source: Oral (09/29 0144) BP: 129/72 (09/29 0144) Pulse Rate: 59 (09/29 0144)  Labs:  Recent Labs  09/08/17 2008  09/09/17 0351 09/09/17 0925 09/09/17 1614 09/10/17 0524 09/11/17 0332  HGB 13.7  < >  --  14.7  --  13.6 13.4  HCT 42.2  < >  --  43.4  --  40.5 40.4  PLT 262  --   --  277  --  263 305  APTT 32  --   --   --   --   --   --   LABPROT 13.1  --   --   --   --   --   --   INR 1.00  --   --   --   --   --   --   HEPARINUNFRC  --   --   --   --   --   --  0.45  CREATININE 1.32*  < >  --  1.16  --  1.20 1.16  TROPONINI  --   --  0.03* <0.03 <0.03  --   --   < > = values in this interval not displayed.  Estimated Creatinine Clearance: 54.5 mL/min (by C-G formula based on SCr of 1.16 mg/dL).   Assessment: 71 y/o M s/p fall, found to have new CVA and atrial flutter, on heparin, initial heparin level is therapeutic   Goal of Therapy:  Heparin level 0.3-0.5 units/ml Monitor platelets by anticoagulation protocol: Yes   Plan:  -Cont heparin at 950 units/hr -1200 HL  Abran Duke 09/11/2017,4:42 AM

## 2017-09-11 NOTE — Progress Notes (Signed)
Progress Note  Patient Name: Nathan Perez Date of Encounter: 09/11/2017  Primary Cardiologist: Dr. Duke Salvia  Subjective   No chest pain, palpitations, or dyspnea. Seen in Nuclear Medicine for 1-day NST.   Inpatient Medications    Scheduled Meds: . regadenoson      . aspirin EC  81 mg Oral Daily  . atorvastatin  20 mg Oral q1800  . feeding supplement (ENSURE ENLIVE)  237 mL Oral TID BM  . gadobenate dimeglumine  15 mL Intravenous Once  . metoprolol succinate  25 mg Oral Daily   Continuous Infusions: . heparin 950 Units/hr (09/10/17 1843)   PRN Meds: acetaminophen **OR** acetaminophen (TYLENOL) oral liquid 160 mg/5 mL **OR** acetaminophen   Vital Signs    Vitals:   09/11/17 1021 09/11/17 1027 09/11/17 1029 09/11/17 1030  BP: (!) 148/95 (!) 160/99 (!) 157/91 (!) 149/87  Pulse:      Resp:      Temp:      TempSrc:      SpO2:      Weight:      Height:        Intake/Output Summary (Last 24 hours) at 09/11/17 1033 Last data filed at 09/11/17 0300  Gross per 24 hour  Intake           198.69 ml  Output                0 ml  Net           198.69 ml   Filed Weights   09/08/17 2000 09/09/17 0418  Weight: 150 lb (68 kg) 147 lb 12.8 oz (67 kg)    Telemetry    Not reviewed, seen in Nuclear Medicine  ECG    Atrial flutter with LVH, HR 93. TWI along inferior and lateral leads.  - Personally Reviewed  Physical Exam   General: Well developed, well nourished African American male appearing in no acute distress. Head: Normocephalic, atraumatic.  Neck: Supple without bruits, JVD not elevated. Lungs:  Resp regular and unlabored, CTA. Heart: Irregularly irregular, S1, S2, no S3, S4, or murmur; no rub. Abdomen: Soft, non-tender, non-distended with normoactive bowel sounds. No hepatomegaly. No rebound/guarding. No obvious abdominal masses. Extremities: No clubbing, cyanosis, or edema. Distal pedal pulses are 2+ bilaterally. Neuro: Alert and oriented X 3. Moves all  extremities spontaneously. Psych: Normal affect.  Labs    Chemistry Recent Labs Lab 09/08/17 2008  09/09/17 0925 09/10/17 0524 09/11/17 0332  NA 135  < > 136 136 138  K 3.8  < > 3.7 4.0 4.0  CL 101  < > 101 102 100*  CO2 24  --  24 28 29   GLUCOSE 94  < > 108* 95 97  BUN 6  < > 7 9 10   CREATININE 1.32*  < > 1.16 1.20 1.16  CALCIUM 9.4  --  9.1 9.1 9.4  PROT 7.3  --   --   --   --   ALBUMIN 4.0  --   --   --   --   AST 23  --   --   --   --   ALT 14*  --   --   --   --   ALKPHOS 63  --   --   --   --   BILITOT 0.8  --   --   --   --   GFRNONAA 52*  --  >60 59* >60  GFRAA >60  --  >  60 >60 >60  ANIONGAP 10  --  11 6 9   < > = values in this interval not displayed.   Hematology Recent Labs Lab 09/09/17 0925 09/10/17 0524 09/11/17 0332  WBC 6.3 4.7 4.8  RBC 4.36 4.12* 4.11*  HGB 14.7 13.6 13.4  HCT 43.4 40.5 40.4  MCV 99.5 98.3 98.3  MCH 33.7 33.0 32.6  MCHC 33.9 33.6 33.2  RDW 14.2 14.0 13.9  PLT 277 263 305    Cardiac Enzymes Recent Labs Lab 09/09/17 0351 09/09/17 0925 09/09/17 1614  TROPONINI 0.03* <0.03 <0.03    Recent Labs Lab 09/08/17 2026  TROPIPOC 0.02     BNPNo results for input(s): BNP, PROBNP in the last 168 hours.   DDimer No results for input(s): DDIMER in the last 168 hours.   Radiology    Dg Chest 2 View  Result Date: 09/09/2017 CLINICAL DATA:  Multiple falls and difficulty swallowing. EXAM: CHEST  2 VIEW COMPARISON:  12/23/2009 chest CT and 12/02/2009 chest radiograph FINDINGS: Cardiomediastinal silhouette is unchanged. 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 There is no evidence of focal airspace disease, pulmonary edema, suspicious pulmonary nodule/mass, pleural effusion, or pneumothorax. No acute bony abnormalities are identified. IMPRESSION: No active cardiopulmonary disease. Electronically Signed   By: Harmon Pier M.D.   On: 09/09/2017 13:17   Mr Maxine Glenn Neck W Wo Contrast  Result Date:  09/09/2017 CLINICAL DATA:  71 year old male with chorea movements of the right arm beginning 3 days ago. EXAM: MRI HEAD WITHOUT CONTRAST MRA HEAD WITHOUT CONTRAST MRA NECK WITHOUT AND WITH CONTRAST TECHNIQUE: Multiplanar, multiecho pulse sequences of the brain and surrounding structures were obtained without intravenous contrast. Angiographic images of the Circle of Willis were obtained using MRA technique without intravenous contrast. Angiographic images of the neck were obtained using MRA technique without and with intravenous contrast. Carotid stenosis measurements (when applicable) are obtained utilizing NASCET criteria, using the distal internal carotid diameter as the denominator. CONTRAST:  15 mL MultiHance COMPARISON:  Head CT without contrast 09/08/2017. FINDINGS: MRI HEAD FINDINGS Brain: Small nodular cortical and subcortical white matter foci of restricted diffusion along the superolateral left motor strip (series 3, image 35 and series 4, image 14), and in the posterior left parietal lobe with little to no associated T2 or FLAIR hyperintensity. No contralateral right hemisphere restricted diffusion. Questionable tiny focus of restricted diffusion in the central right cerebellum (series 4, image 9). No acute or chronic intracranial hemorrhage. No mass effect. No evidence of mass lesion, ventriculomegaly, extra-axial collection. Cervicomedullary junction and pituitary are within normal limits. Patchy and confluent as well as widely scattered foci of abnormal cerebral white matter T2 and FLAIR hyperintensity in both hemispheres. No cortical encephalomalacia identified. Signal in the bilateral deep gray matter nuclei appears normal. Normal brainstem. Vascular: Major intracranial vascular flow voids are preserved. Skull and upper cervical spine: Negative. Normal bone marrow signal. Sinuses/Orbits: Normal orbits soft tissues. Scattered mild areas of paranasal sinus mucosal thickening or small retention cyst.  Other: Grossly normal visible internal auditory structures. Mastoid air cells are clear. Scalp and face soft tissues appear negative. MRA NECK FINDINGS Precontrast time-of-flight images demonstrate antegrade flow in both carotid and vertebral arteries throughout the neck. The right vertebral artery appears dominant. Post-contrast neck MRA images reveal a 3 vessel arch configuration, although the left vertebral arises very near the arch. No great vessel origin stenosis suspected. Both common carotid arteries, carotid bifurcations, and cervical ICAs are normal for age. Minimal atherosclerotic irregularity and  no stenosis in the neck. The right vertebral artery is dominant and patent throughout the neck without stenosis although its origin is not well visualized. On source images the left vertebral artery is seen to arise very proximal near the aortic arch. The proximal left vertebral artery is poorly visualized on MIPS images. The left vertebral remains patent to the skullbase without significant stenosis suspected. MRA HEAD FINDINGS The distal left vertebral artery terminates in PICA. The distal right vertebral artery supplies the basilar. No distal right vertebral artery stenosis. The basilar is patent without stenosis. Fetal type bilateral PCA origins, more so the left. SCA origins are patent. Bilateral PCA branches are within normal limits. Antegrade flow in both ICA siphons. Mild siphon irregularity without stenosis. Normal ophthalmic and posterior communicating artery origins. Patent carotid termini. Normal MCA and ACA origins. Anterior communicating artery and visible ACA branches are within normal limits. Left MCA M1 segment, bifurcation, and visible left MCA branches are within normal limits. Right MCA M1 segment, bifurcation, and visible right MCA branches are within normal limits. IMPRESSION: 1. Positive for several small acute infarcts in the posterior left MCA territory, including involvement of the  posterolateral left motor strip. 2. Questionable punctate acute or subacute lacunar infarct superimposed in the right cerebellum, but this may be artifact. 3. No associated hemorrhage or mass effect. 4. Underlying moderately advanced for age but nonspecific bilateral cerebral white matter T2 and FLAIR hyperintensity. 5. Normal noncontrast MRI appearance of the deep gray matter nuclei and brainstem. 6. Negative for age Neck MRA. 7. Negative for age intracranial MRA. Visible left MCA branches are normal. 8. Incidental normal vascular anatomic variants including dominant right vertebral artery which supplies the basilar, non dominant the left arises near the aortic arch and terminates in the left PICA, and fetal type bilateral PCA origins. Electronically Signed   By: Odessa Fleming M.D.   On: 09/09/2017 13:49   Mr Brain Wo Contrast  Result Date: 09/09/2017 CLINICAL DATA:  71 year old male with chorea movements of the right arm beginning 3 days ago. EXAM: MRI HEAD WITHOUT CONTRAST MRA HEAD WITHOUT CONTRAST MRA NECK WITHOUT AND WITH CONTRAST TECHNIQUE: Multiplanar, multiecho pulse sequences of the brain and surrounding structures were obtained without intravenous contrast. Angiographic images of the Circle of Willis were obtained using MRA technique without intravenous contrast. Angiographic images of the neck were obtained using MRA technique without and with intravenous contrast. Carotid stenosis measurements (when applicable) are obtained utilizing NASCET criteria, using the distal internal carotid diameter as the denominator. CONTRAST:  15 mL MultiHance COMPARISON:  Head CT without contrast 09/08/2017. FINDINGS: MRI HEAD FINDINGS Brain: Small nodular cortical and subcortical white matter foci of restricted diffusion along the superolateral left motor strip (series 3, image 35 and series 4, image 14), and in the posterior left parietal lobe with little to no associated T2 or FLAIR hyperintensity. No contralateral right  hemisphere restricted diffusion. Questionable tiny focus of restricted diffusion in the central right cerebellum (series 4, image 9). No acute or chronic intracranial hemorrhage. No mass effect. No evidence of mass lesion, ventriculomegaly, extra-axial collection. Cervicomedullary junction and pituitary are within normal limits. Patchy and confluent as well as widely scattered foci of abnormal cerebral white matter T2 and FLAIR hyperintensity in both hemispheres. No cortical encephalomalacia identified. Signal in the bilateral deep gray matter nuclei appears normal. Normal brainstem. Vascular: Major intracranial vascular flow voids are preserved. Skull and upper cervical spine: Negative. Normal bone marrow signal. Sinuses/Orbits: Normal orbits soft tissues. Scattered mild  areas of paranasal sinus mucosal thickening or small retention cyst. Other: Grossly normal visible internal auditory structures. Mastoid air cells are clear. Scalp and face soft tissues appear negative. MRA NECK FINDINGS Precontrast time-of-flight images demonstrate antegrade flow in both carotid and vertebral arteries throughout the neck. The right vertebral artery appears dominant. Post-contrast neck MRA images reveal a 3 vessel arch configuration, although the left vertebral arises very near the arch. No great vessel origin stenosis suspected. Both common carotid arteries, carotid bifurcations, and cervical ICAs are normal for age. Minimal atherosclerotic irregularity and no stenosis in the neck. The right vertebral artery is dominant and patent throughout the neck without stenosis although its origin is not well visualized. On source images the left vertebral artery is seen to arise very proximal near the aortic arch. The proximal left vertebral artery is poorly visualized on MIPS images. The left vertebral remains patent to the skullbase without significant stenosis suspected. MRA HEAD FINDINGS The distal left vertebral artery terminates in  PICA. The distal right vertebral artery supplies the basilar. No distal right vertebral artery stenosis. The basilar is patent without stenosis. Fetal type bilateral PCA origins, more so the left. SCA origins are patent. Bilateral PCA branches are within normal limits. Antegrade flow in both ICA siphons. Mild siphon irregularity without stenosis. Normal ophthalmic and posterior communicating artery origins. Patent carotid termini. Normal MCA and ACA origins. Anterior communicating artery and visible ACA branches are within normal limits. Left MCA M1 segment, bifurcation, and visible left MCA branches are within normal limits. Right MCA M1 segment, bifurcation, and visible right MCA branches are within normal limits. IMPRESSION: 1. Positive for several small acute infarcts in the posterior left MCA territory, including involvement of the posterolateral left motor strip. 2. Questionable punctate acute or subacute lacunar infarct superimposed in the right cerebellum, but this may be artifact. 3. No associated hemorrhage or mass effect. 4. Underlying moderately advanced for age but nonspecific bilateral cerebral white matter T2 and FLAIR hyperintensity. 5. Normal noncontrast MRI appearance of the deep gray matter nuclei and brainstem. 6. Negative for age Neck MRA. 7. Negative for age intracranial MRA. Visible left MCA branches are normal. 8. Incidental normal vascular anatomic variants including dominant right vertebral artery which supplies the basilar, non dominant the left arises near the aortic arch and terminates in the left PICA, and fetal type bilateral PCA origins. Electronically Signed   By: Odessa Fleming M.D.   On: 09/09/2017 13:49   Dg Esophagus  Result Date: 09/09/2017 CLINICAL DATA:  71 year old male with history of 20 pound weight loss. Dysphagia. Feelings of regurgitation and difficulty ingesting food for several years. EXAM: ESOPHOGRAM/BARIUM SWALLOW TECHNIQUE: Single contrast examination was performed  using  thin barium. FLUOROSCOPY TIME:  Fluoroscopy Time:  1 minutes and 36 seconds Radiation Exposure Index (if provided by the fluoroscopic device): 8.5 mGy COMPARISON:  None. FINDINGS: Limited single contrast esophagram demonstrates a structurally normal esophagus, with no esophageal mass, stricture or esophageal ring. No hiatal hernia. Intermittent failure to propagate a normal primary peristaltic wave was noted. No tertiary contractions. A 13 mm barium tablet was administered, which passed readily into the stomach. IMPRESSION: 1. Mild nonspecific esophageal motility disorder. 2. Otherwise, normal single contrast esophagram. Electronically Signed   By: Trudie Reed M.D.   On: 09/09/2017 16:06   Mr Maxine Glenn Head Wo Contrast  Result Date: 09/09/2017 CLINICAL DATA:  71 year old male with chorea movements of the right arm beginning 3 days ago. EXAM: MRI HEAD WITHOUT CONTRAST MRA HEAD  WITHOUT CONTRAST MRA NECK WITHOUT AND WITH CONTRAST TECHNIQUE: Multiplanar, multiecho pulse sequences of the brain and surrounding structures were obtained without intravenous contrast. Angiographic images of the Circle of Willis were obtained using MRA technique without intravenous contrast. Angiographic images of the neck were obtained using MRA technique without and with intravenous contrast. Carotid stenosis measurements (when applicable) are obtained utilizing NASCET criteria, using the distal internal carotid diameter as the denominator. CONTRAST:  15 mL MultiHance COMPARISON:  Head CT without contrast 09/08/2017. FINDINGS: MRI HEAD FINDINGS Brain: Small nodular cortical and subcortical white matter foci of restricted diffusion along the superolateral left motor strip (series 3, image 35 and series 4, image 14), and in the posterior left parietal lobe with little to no associated T2 or FLAIR hyperintensity. No contralateral right hemisphere restricted diffusion. Questionable tiny focus of restricted diffusion in the central  right cerebellum (series 4, image 9). No acute or chronic intracranial hemorrhage. No mass effect. No evidence of mass lesion, ventriculomegaly, extra-axial collection. Cervicomedullary junction and pituitary are within normal limits. Patchy and confluent as well as widely scattered foci of abnormal cerebral white matter T2 and FLAIR hyperintensity in both hemispheres. No cortical encephalomalacia identified. Signal in the bilateral deep gray matter nuclei appears normal. Normal brainstem. Vascular: Major intracranial vascular flow voids are preserved. Skull and upper cervical spine: Negative. Normal bone marrow signal. Sinuses/Orbits: Normal orbits soft tissues. Scattered mild areas of paranasal sinus mucosal thickening or small retention cyst. Other: Grossly normal visible internal auditory structures. Mastoid air cells are clear. Scalp and face soft tissues appear negative. MRA NECK FINDINGS Precontrast time-of-flight images demonstrate antegrade flow in both carotid and vertebral arteries throughout the neck. The right vertebral artery appears dominant. Post-contrast neck MRA images reveal a 3 vessel arch configuration, although the left vertebral arises very near the arch. No great vessel origin stenosis suspected. Both common carotid arteries, carotid bifurcations, and cervical ICAs are normal for age. Minimal atherosclerotic irregularity and no stenosis in the neck. The right vertebral artery is dominant and patent throughout the neck without stenosis although its origin is not well visualized. On source images the left vertebral artery is seen to arise very proximal near the aortic arch. The proximal left vertebral artery is poorly visualized on MIPS images. The left vertebral remains patent to the skullbase without significant stenosis suspected. MRA HEAD FINDINGS The distal left vertebral artery terminates in PICA. The distal right vertebral artery supplies the basilar. No distal right vertebral artery  stenosis. The basilar is patent without stenosis. Fetal type bilateral PCA origins, more so the left. SCA origins are patent. Bilateral PCA branches are within normal limits. Antegrade flow in both ICA siphons. Mild siphon irregularity without stenosis. Normal ophthalmic and posterior communicating artery origins. Patent carotid termini. Normal MCA and ACA origins. Anterior communicating artery and visible ACA branches are within normal limits. Left MCA M1 segment, bifurcation, and visible left MCA branches are within normal limits. Right MCA M1 segment, bifurcation, and visible right MCA branches are within normal limits. IMPRESSION: 1. Positive for several small acute infarcts in the posterior left MCA territory, including involvement of the posterolateral left motor strip. 2. Questionable punctate acute or subacute lacunar infarct superimposed in the right cerebellum, but this may be artifact. 3. No associated hemorrhage or mass effect. 4. Underlying moderately advanced for age but nonspecific bilateral cerebral white matter T2 and FLAIR hyperintensity. 5. Normal noncontrast MRI appearance of the deep gray matter nuclei and brainstem. 6. Negative for age Neck MRA.  7. Negative for age intracranial MRA. Visible left MCA branches are normal. 8. Incidental normal vascular anatomic variants including dominant right vertebral artery which supplies the basilar, non dominant the left arises near the aortic arch and terminates in the left PICA, and fetal type bilateral PCA origins. Electronically Signed   By: Odessa Fleming M.D.   On: 09/09/2017 13:49    Cardiac Studies   Echocardiogram: 09/10/2017 Study Conclusions  - Left ventricle: The cavity size was normal. Wall thickness was   normal. Systolic function was severely reduced. The estimated   ejection fraction was in the range of 25% to 30%. Severe diffuse   hypokinesis with no identifiable regional variations. No evidence   of thrombus. - Right ventricle: The  cavity size was mildly dilated. Systolic   function was moderately reduced. - Right atrium: The atrium was mildly dilated.  Patient Profile     71 y.o. male w/ PMH of HTN, HLD, and tobacco use admitted for an acute CVA. Cardiology consulted for a newly reduced EF of 25-30%.   Assessment & Plan    1. Systolic and diastolic heart failure - Chronicity unknown as echo this admission shows a reduced EF of 25-30% with diffuse HK. He denies any recent symptoms and does not appear volume overloaded by physical examination. Possibly tachycardia induced or another cause. His heart rate has been mostly well-controlled but occasionally goes to the 140's. A Lexiscan Myoview is pending to rule-out an ischemic etiology.  - continue Toprol-XL  daily. Will start Losartan  daily in the setting of his cardiomyopathy (renal function remains stable).   2. Newly Diagnosed Atrial flutter:  - plan was initially for inpatient TEE but given his acute stroke and documented arrhythmia, it has been recommended to wait at least 4 weeks prior to cardioversion. Plan for outpatient TEE/DCCV in 4 weeks. No need for loop recorder as he has already been in atrial flutter here. Start NOAC pending stress test results.  - continue Toprol-XL  daily for rate-control (held this AM due to NST).   3. Acute CVA - Likely embolic. Start heparin and switch to oral anticoagulation after NST if he does not need a cath.    - per admitting team.   Patient seen and examined.  Appears stable today and not short of breath or any chest pain.  Had nuclear stress test earlier this day.  Starting on therapy for reduced ejection fraction.  Awaiting results of nuclear testing.   Signed, Ellsworth Lennox , PA-C 10:33 AM  Patient seen and examined.  Appears stable today and not short of breath or any chest pain.  Had nuclear stress test earlier this day.  Starting on therapy for reduced ejection fraction.  Awaiting results of nuclear  testing.  Darden Palmer. MD [facility] 12:19 PM 09/11/2017

## 2017-09-11 NOTE — Progress Notes (Signed)
STROKE TEAM PROGRESS NOTE   SUBJECTIVE (INTERVAL HISTORY) No family is at the bedside. On acute event overnight.    OBJECTIVE Temp:  [97.8 F (36.6 C)-98.5 F (36.9 C)] 97.9 F (36.6 C) (09/29 0605) Pulse Rate:  [52-97] 97 (09/29 0605) Cardiac Rhythm: Atrial flutter (09/28 1900) Resp:  [18-20] 18 (09/29 0605) BP: (113-136)/(64-88) 133/88 (09/29 0605) SpO2:  [98 %-100 %] 100 % (09/29 0605)  CBC:   Recent Labs Lab 09/08/17 2008  09/10/17 0524 09/11/17 0332  WBC 5.5  < > 4.7 4.8  NEUTROABS 3.4  --   --   --   HGB 13.7  < > 13.6 13.4  HCT 42.2  < > 40.5 40.4  MCV 98.8  < > 98.3 98.3  PLT 262  < > 263 305  < > = values in this interval not displayed.  Basic Metabolic Panel:   Recent Labs Lab 09/10/17 0524 09/11/17 0332  NA 136 138  K 4.0 4.0  CL 102 100*  CO2 28 29  GLUCOSE 95 97  BUN 9 10  CREATININE 1.20 1.16  CALCIUM 9.1 9.4    Lipid Panel:     Component Value Date/Time   CHOL 164 09/09/2017 0925   TRIG 60 09/09/2017 0925   HDL 64 09/09/2017 0925   CHOLHDL 2.6 09/09/2017 0925   VLDL 12 09/09/2017 0925   LDLCALC 88 09/09/2017 0925   HgbA1c:  Lab Results  Component Value Date   HGBA1C 5.1 09/09/2017   Urine Drug Screen: No results found for: LABOPIA, COCAINSCRNUR, LABBENZ, AMPHETMU, THCU, LABBARB  Alcohol Level No results found for: ETH  IMAGING I have personally reviewed the radiological images below and agree with the radiology interpretations.  Ct Head Wo Contrast 09/08/2017 IMPRESSION: 1. No acute intracranial abnormality identified. If symptoms persist or if clinically indicated MRI is more sensitive for stroke. 2. Mild chronic microvascular ischemic changes and parenchymal volume loss of the brain for age.   DG Chest 2 View 09/09/2017 IMPRESSION: No active cardiopulmonary disease.  MRI Brain Wo Contrast MRA Brain Wo Contrast 09/09/2017 IMPRESSION: 1. Positive for several small acute infarcts in the posterior left MCA territory,  including involvement of the posterolateral left motor strip. 2. Questionable punctate acute or subacute lacunar infarct superimposed in the right cerebellum, but this may be artifact. 3. No associated hemorrhage or mass effect. 4. Underlying moderately advanced for age but nonspecific bilateral cerebral white matter T2 and FLAIR hyperintensity. 5. Normal noncontrast MRI appearance of the deep gray matter nuclei and brainstem. 6. Negative for age Neck MRA. 7. Negative for age intracranial MRA. Visible left MCA branches are normal. 8. Incidental normal vascular anatomic variants including dominant right vertebral artery which supplies the basilar, non dominant the left arises near the aortic arch and terminates in the left PICA, and fetal type bilateral PCA origins.  TTE 09/09/2017 - Left ventricle: The cavity size was normal. Wall thickness was   normal. Systolic function was severely reduced. The estimated   ejection fraction was in the range of 25% to 30%. Severe diffuse   hypokinesis with no identifiable regional variations. No evidence   of thrombus. - Right ventricle: The cavity size was mildly dilated. Systolic   function was moderately reduced. - Right atrium: The atrium was mildly dilated.  LE venous doppler - no DVT   TEE pending   PHYSICAL EXAM  Temp:  [97.8 F (36.6 C)-98.5 F (36.9 C)] 97.9 F (36.6 C) (09/29 0605) Pulse Rate:  [52-97] 97 (  09/29 1610) Resp:  [18-20] 18 (09/29 9604) BP: (113-136)/(64-88) 133/88 (09/29 0605) SpO2:  [98 %-100 %] 100 % (09/29 0605)  General - Well nourished, well developed, in no apparent distress.  Ophthalmologic - Fundi not visualized due to noncooperation.  Cardiovascular - Regular rate and rhythm.  Mental Status -  Level of arousal and orientation to time, place, and person were intact. Language including expression, naming, repetition, comprehension was assessed and found intact. Fund of Knowledge was assessed and was  intact.  Cranial Nerves II - XII - II - Visual field intact OU. III, IV, VI - Extraocular movements intact. V - Facial sensation intact bilaterally. VII - mild right facial droop. VIII - Hearing & vestibular intact bilaterally. X - Palate elevates symmetrically, mild dysarthria. XI - Chin turning & shoulder shrug intact bilaterally. XII - Tongue protrusion intact.  Motor Strength - The patient's strength was normal in all extremities and pronator drift was absent.  Bulk was normal and fasciculations were absent.   Motor Tone - Muscle tone was assessed at the neck and appendages and was normal.  Reflexes - The patient's reflexes were 1+ in all extremities and he had no pathological reflexes.  Sensory - Light touch, temperature/pinprick were assessed and were symmetrical.    Coordination - The patient had normal movements in the hands and feet with no ataxia or dysmetria.  Tremor was absent.  Gait and Station - deferred.   ASSESSMENT/PLAN [name] is a 71 y.o. male with history of tobacco abuse and no known other vascular risk factors presents to the emergency room after 2 falls today. He did not receive IV t-PA due to arriving outside of the tPA treatment window.   Stroke: 3 x punctate L MCA territory and 1 x right cerebellum acute infarcts, likely cardioembolic, of undetermined etiology   Resultant  mild right facial droop and dysarthria  CT head: no acute stroke  MRI head: Multiple small L MCA territory acute infarcts including the posterolateral left motor strip and right cerebellum.  MRA head and neck: no LVO or high-grade stenosis  LE venous doppler - no DVT  2D Echo: EF 25-30%, no thrombus  Cardiology following for cardiomyopathy with low EF  Recommend TEE outpatient in 4 weeks  LDL 88  HgbA1c 5.1  Lovenox 40 mg sq daily for VTE prophylaxis Diet NPO time specified Except for: Sips with Meds Diet NPO time specified Except for: Sips with Meds  No  antithrombotic prior to admission, now on aspirin 325 mg daily. Start NOAC pending stress test results.  Patient counseled to be compliant with his antithrombotic medications  Ongoing aggressive stroke risk factor management  Therapy recommendations: None  Disposition: Pending  Cardiomyopathy   TTE with EF 25-30%  Recommend cardiology consult Recommend TEE outpatient 4 weeks  Hypertension  Stable  Permissive hypertension (OK if < 220/120) but gradually normalize in 5-7 days  Long-term BP goal normotensive  Hyperlipidemia  Home meds:  none  LDL 88, goal < 70  Add atorvastatin 20 mg PO daily  Continue statin at discharge  Tobacco abuse  Current smoker  Smoking cessation counseling provided  Pt is willing to quit  Other Stroke Risk Factors  Advanced age  ETOH use, advised to drink no more than 2 drink(s) a day  Other Active Problems  None  Hospital day # 1  Stroke team will sign off at this time.   To contact Stroke Continuity provider, please refer to [email or web address]. After hours,  contact General Neurology

## 2017-09-11 NOTE — Progress Notes (Signed)
Patient's stress test showed:   IMPRESSION: 1. Findings worrisome for prior infarctions involving the left ventricular apex as well as the mid and basilar aspects of the inferior wall of the left ventricle. 2. Findings worrisome for small area of pharmacologically induced ischemia involving the mid, septal aspect of the anterior wall of the left ventricle. 3. Severe left ventricular dilatation. 4. Global hypokinesia with relative akinesia involving the left ventricular apex, at the location of suspected prior infarction. 5. Ejection fraction - 26%. 6. Non invasive risk stratification*: High   Results reviewed with the patient. Only a small area of ischemia identified. Reviewed with Dr. Donnie Aho. With his recent CVA, he would not be a good catheterization candidate at this time. Continue medical therapy for cardiomyopathy with plans to switch to NOAC once safe from Neurology's perspective for his atrial flutter. Will plan for an outpatient TEE/DCCV in 4 weeks as outlined in today's progress note.   Signed, Ellsworth Lennox, PA-C 09/11/2017, 4:25 PM Pager: [phone]

## 2017-09-11 NOTE — Progress Notes (Addendum)
ANTICOAGULATION CONSULT NOTE - Follow Up Consult  Pharmacy Consult for Heparin  Indication: Atrial flutter/CVA  No Known Allergies  Patient Measurements: Height:  (185.4 cm) Weight: 147 lb 12.8 oz (67 kg) IBW/kg (Calculated) : 79.9   Vital Signs: Temp: 98 F (36.7 C) (09/29 1323) Temp Source: Oral (09/29 1323) BP: 133/83 (09/29 1323) Pulse Rate: 96 (09/29 1323)  Labs:  Recent Labs  09/08/17 2008  09/09/17 0351 09/09/17 0925 09/09/17 1614 09/10/17 0524 09/11/17 0332 09/11/17 1148  HGB 13.7  < >  --  14.7  --  13.6 13.4  --   HCT 42.2  < >  --  43.4  --  40.5 40.4  --   PLT 262  --   --  277  --  263 305  --   APTT 32  --   --   --   --   --   --   --   LABPROT 13.1  --   --   --   --   --   --   --   INR 1.00  --   --   --   --   --   --   --   HEPARINUNFRC  --   --   --   --   --   --  0.45 0.81*  CREATININE 1.32*  < >  --  1.16  --  1.20 1.16  --   TROPONINI  --   --  0.03* <0.03 <0.03  --   --   --   < > = values in this interval not displayed.  Estimated Creatinine Clearance: 54.5 mL/min (by C-G formula based on SCr of 1.16 mg/dL).   Assessment: 89 yoM s/p fall, found to have new CVA and atrial flutter. Pharmacy consulted to start heparin. Initial level therapeutic at 0.45, confirmatory level elevated at 0.81. Note lower goal of 0.3-0.5. CBC WNL, stable. No bleeding noted.  Goal of Therapy:  Heparin level 0.3-0.5 units/ml Monitor platelets by anticoagulation protocol: Yes   Plan:  - Decrease heparin gtt to 750 units/hr - Recheck heparin level at 2000 - Monitor for s/sx of bleeding - Start NOAC pending stress test results   Erin N. Zigmund Daniel, PharmD PGY1 Pharmacy Resident Pager: [phone]

## 2017-09-12 LAB — BASIC METABOLIC PANEL
ANION GAP: 9 (ref 5–15)
BUN: 8 mg/dL (ref 6–20)
CALCIUM: 9.2 mg/dL (ref 8.9–10.3)
CO2: 28 mmol/L (ref 22–32)
Chloride: 101 mmol/L (ref 101–111)
Creatinine, Ser: 1.19 mg/dL (ref 0.61–1.24)
GFR calc Af Amer: >60 mL/min (ref >60)
GFR, EST NON AFRICAN AMERICAN: 59 mL/min — AB (ref >60)
GLUCOSE: 96 mg/dL (ref 65–99)
Potassium: 4 mmol/L (ref 3.5–5.1)
SODIUM: 138 mmol/L (ref 135–145)

## 2017-09-12 LAB — CBC
HCT: 41.6 % (ref 39.0–52.0)
Hemoglobin: 13.9 g/dL (ref 13.0–17.0)
MCH: 33.1 pg (ref 26.0–34.0)
MCHC: 33.4 g/dL (ref 30.0–36.0)
MCV: 99 fL (ref 78.0–100.0)
PLATELETS: 308 10*3/uL (ref 150–400)
RBC: 4.2 MIL/uL — ABNORMAL LOW (ref 4.22–5.81)
RDW: 13.9 % (ref 11.5–15.5)
WBC: 5.2 10*3/uL (ref 4.0–10.5)

## 2017-09-12 LAB — HEPARIN LEVEL (UNFRACTIONATED)
HEPARIN UNFRACTIONATED: 0.35 [IU]/mL (ref 0.30–0.70)
Heparin Unfractionated: 0.37 IU/mL (ref 0.30–0.70)

## 2017-09-12 NOTE — Progress Notes (Signed)
ANTICOAGULATION CONSULT NOTE - Follow Up Consult  Pharmacy Consult for Heparin  Indication: Atrial flutter/CVA  No Known Allergies  Patient Measurements: Height:  (185.4 cm) Weight: 147 lb 12.8 oz (67 kg) IBW/kg (Calculated) : 79.9  Vital Signs: Temp: 97.5 F (36.4 C) (09/29 2144) Temp Source: Oral (09/29 2144) BP: 135/92 (09/29 2144) Pulse Rate: 69 (09/29 2144)  Labs:  Recent Labs  09/09/17 0351  09/09/17 0925 09/09/17 1614 09/10/17 0524 09/11/17 0332 09/11/17 1148 09/11/17 2232  HGB  --   < > 14.7  --  13.6 13.4  --   --   HCT  --   --  43.4  --  40.5 40.4  --   --   PLT  --   --  277  --  263 305  --   --   HEPARINUNFRC  --   --   --   --   --  0.45 0.81* 0.35  CREATININE  --   --  1.16  --  1.20 1.16  --   --   TROPONINI 0.03*  --  <0.03 <0.03  --   --   --   --   < > = values in this interval not displayed.  Estimated Creatinine Clearance: 54.5 mL/min (by C-G formula based on SCr of 1.16 mg/dL).   Assessment: 71 y/o M s/p fall, found to have new CVA and atrial flutter, on heparin, heparin level therapeutic x 1 after rate decrease  Goal of Therapy:  Heparin level 0.3-0.5 units/ml Monitor platelets by anticoagulation protocol: Yes   Plan:  -Dec heparin to 750 units/hr -Heparin level with AM labs  Abran Duke 09/12/2017,12:37 AM

## 2017-09-12 NOTE — Progress Notes (Signed)
ANTICOAGULATION CONSULT NOTE - Follow Up Consult  Pharmacy Consult for Heparin  Indication: Atrial flutter/CVA  No Known Allergies  Patient Measurements: Height:  (185.4 cm) Weight: 147 lb 12.8 oz (67 kg) IBW/kg (Calculated) : 79.9   Vital Signs: Temp: 98.2 F (36.8 C) (09/30 0513) Temp Source: Oral (09/30 0513) BP: 144/72 (09/30 0513) Pulse Rate: 72 (09/30 0513)  Labs:  Recent Labs  09/09/17 0925 09/09/17 1614 09/10/17 0524  09/11/17 0332 09/11/17 1148 09/11/17 2232 09/12/17 0519  HGB 14.7  --  13.6  --  13.4  --   --  13.9  HCT 43.4  --  40.5  --  40.4  --   --  41.6  PLT 277  --  263  --  305  --   --  308  HEPARINUNFRC  --   --   --   < > 0.45 0.81* 0.35 0.37  CREATININE 1.16  --  1.20  --  1.16  --   --  1.19  TROPONINI <0.03 <0.03  --   --   --   --   --   --   < > = values in this interval not displayed.  Estimated Creatinine Clearance: 53.2 mL/min (by C-G formula based on SCr of 1.19 mg/dL).   Assessment: 20 yoM s/p fall, found to have new CVA and atrial flutter. Pharmacy consulted to start heparin. Heparin level therapeutic this AM at 0.37. CBC WNL, stable. No bleeding noted.  Goal of Therapy:  Heparin level 0.3-0.5 units/ml Monitor platelets by anticoagulation protocol: Yes   Plan:  - Continue heparin gtt at 750 units/hr - Daily heparin level and CBC while on heparin - Monitor for s/sx of bleeding - Start NOAC pending stress test results   Jarryd Gratz N. Zigmund Daniel, PharmD PGY1 Pharmacy Resident Pager: [phone]

## 2017-09-12 NOTE — Progress Notes (Signed)
Subjective:  Continues to feel well.  No chest pain or shortness of breath.  He is actively doing exercises trying to improve his strength.  His nuclear scan yesterday showed a couple of fixed defects as well as a possible focus of anteroseptal ischemia.  He has never had angina and EKG does not suggest previous infarctions.  Objective:  Vital Signs in the last 24 hours: BP 116/79 (BP Location: Left Arm)   Pulse 80   Temp 98 F (36.7 C) (Oral)   Resp 18   Ht  (1.854 m)   Wt 67 kg (147 lb 12.8 oz)   SpO2 100%   BMI 19.50 kg/m   Physical Exam: Pleasant thin black male currently in no acute distress Lungs:  Clear Cardiac:  irregular rhythm, normal S1 and S2, no S3 Abdomen:  Soft, nontender, no masses Extremities:  No edema present  Intake/Output from previous day: 09/29 0701 - 09/30 0700 In: 240 [P.O.:240] Out: -   Weight Filed Weights   09/08/17 2000 09/09/17 0418  Weight: 68 kg (150 lb) 67 kg (147 lb 12.8 oz)    Lab Results: Basic Metabolic Panel:  Recent Labs  16/10/96 0332 09/12/17 0519  NA 138 138  K 4.0 4.0  CL 100* 101  CO2 29 28  GLUCOSE 97 96  BUN 10 8  CREATININE 1.16 1.19   CBC:  Recent Labs  09/11/17 0332 09/12/17 0519  WBC 4.8 5.2  HGB 13.4 13.9  HCT 40.4 41.6  MCV 98.3 99.0  PLT 305 308   Cardiac Panel (last 3 results)  Recent Labs  09/09/17 1614  TROPONINI <0.03    Telemetry: Atrial flutter response is controlled  Assessment/Plan:  1.  Cardiomyopathy-unclear if ischemic 2.  Recent embolic stroke 3.  Atrial flutter  Recommendations:  He had an abnormal nuclear scan with some fixed defects as well as a focus of anteroseptal ischemia noted.  Ideally would be good to have cardiac catheterization however has had a recent acute stroke.  He ideally should have time to recover from his strokeas he would be at increased risk of issues from catheterization and is not really a good candidate for intervention at the present  time.  When ready from a neurological viewpoint I would change him to oral anticoagulation and plan to do an elective cardioversion with TEE in 4 weeks to see if he has improvement in LV function.  May consider catheterization down the road.     Darden Palmer  MD [facility]  09/12/2017, 11:49 AM

## 2017-09-12 NOTE — Progress Notes (Signed)
[facility]                                                                              Patient Demographics  Nathan Perez, is a 71 y.o. male, DOB - [birth year], ZOX:[id number]  Admit date - 09/09/2017   Admitting Physician Pearson Grippe, MD  Outpatient Primary MD for the patient is Pecola Lawless, MD  Outpatient specialists:   LOS - 2  days   Medical records reviewed and are as summarized below:    Chief Complaint  Patient presents with  . Weakness       Brief summary   Patient is a 71 year old male who had a fall on the day of admission at about 5:30 PM. Subsequently went inside and fell in his room again. Patient was brought to the ED by his family. Initial CT head was negative, telemetry showed atrial flutter 85-108. Patient was admitted due to the concern for CVA   Assessment & Plan    Principal Problem:   Acute CVA (cerebrovascular accident) (HCC)presenting with falls - MRI of the brain showed positive several small acute infarcts in the posterior left MCA territory including involvement of the posterior lateral left motor strip, question about punctate acute or subacute lacunar infarct superimposed in the right cerebellum, may be artifact. No associated hemorrhage or mass effect. - Negative intracranial MRA head and neck - placed on aspirin 325 mg daily, Lipitor - PT OT eval recommended outpatient OT  - LDL 88, on statin, hemoglobin A1c 5.1 - doppler US showed no LE DVT  - 2-D echo showed EF of 25-30% with diffuse hypokinesis, called cardiology consult.  - nuclear medicine stress test showed fixed defect as well as focus of anteroseptal ischemia, EF 26%.   Active Problems:   Hypertension -BP stable, not on any antihypertensives outpatient, started on losartan, metoprolol     Atrial flutter (HCC) - per cardiology, needs TEE/DCCV, due to acute stroke would wait at least 4 weeks prior to cardioversion, plan outpatient - No need for loop recorder,  started on IV heparin drip. Will likely need NOAC if he doesn't need cath. - TEE planned on Monday, NPO after MN    Hyperlipidemia - lDL 88, goal less than 70, placed on statin   ?dysphagia: - esophagogram showed no obstruction, patient eating without difficulty  Code Status: full code  DVT Prophylaxis:   SCD's Family Communication: Discussed in detail with the patient, all imaging results, lab results explained to the patient  Disposition Plan: TEE in am  Time Spent in minutes  25 minutes  Procedures:  MRI brain, MRA head and neck  Consultants:   Neurology Cardiology   Antimicrobials:      Medications  Scheduled Meds: . aspirin EC  81 mg Oral Daily  . atorvastatin  20 mg Oral q1800  . feeding supplement (ENSURE ENLIVE)  237 mL Oral TID BM  . gadobenate dimeglumine  15 mL Intravenous Once  . losartan  25 mg Oral Daily  . metoprolol succinate  25 mg Oral Daily   Continuous Infusions: . heparin 750 Units/hr (09/11/17 2321)   PRN Meds:.acetaminophen **OR** acetaminophen (TYLENOL)  oral liquid 160 mg/5 mL **OR** acetaminophen   Antibiotics   Anti-infectives    None        Subjective:   [name] was seen and examined today. No complaints, no chest pain or shortness of breath.Patient denies dizziness, abdominal pain, N/V/D/C, new weakness, numbess, tingling.   Objective:   Vitals:   09/12/17 0116 09/12/17 0513 09/12/17 0838 09/12/17 0936  BP: 133/89 (!) 144/72 114/68 116/79  Pulse: 67 72 99 80  Resp: Temp: 97.6 F (36.4 C) 98.2 F (36.8 C) 98 F (36.7 C)   TempSrc: Oral Oral Oral   SpO2: 100% 100% 99% 100%  Weight:      Height:        Intake/Output Summary (Last 24 hours) at 09/12/17 1325 Last data filed at 09/11/17 1500  Gross per 24 hour  Intake              240 ml  Output                0 ml  Net              240 ml     Wt Readings from Last 3 Encounters:  09/09/17 67 kg (147 lb 12.8 oz)  11/26/09 75.6 kg (166 lb 9.6  oz)     Exam   General: Alert and oriented x 3, NAD  Eyes:   HEENT:  Atraumatic, normocephalic  Cardiovascular: S1 S2 auscultated,irregularly irregular. No pedal edema b/l  Respiratory: Clear to auscultation bilaterally, no wheezing, rales or rhonchi  Gastrointestinal: Soft, nontender, nondistended, + bowel sounds  Ext: no pedal edema bilaterally  Neuro: no new deficits  Musculoskeletal: No digital cyanosis, clubbing  Skin: No rashes  Psych: Normal affect and demeanor, alert and oriented x3    Data Reviewed:  I have personally reviewed following labs and imaging studies  Micro Results No results found for this or any previous visit (from the past 240 hour(s)).  Radiology Reports Dg Chest 2 View  Result Date: 09/09/2017 CLINICAL DATA:  Multiple falls and difficulty swallowing. EXAM: CHEST  2 VIEW COMPARISON:  12/23/2009 chest CT and 12/02/2009 chest radiograph FINDINGS: Cardiomediastinal silhouette is unchanged. 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 There is no evidence of focal airspace disease, pulmonary edema, suspicious pulmonary nodule/mass, pleural effusion, or pneumothorax. No acute bony abnormalities are identified. IMPRESSION: No active cardiopulmonary disease. Electronically Signed   By: Harmon Pier M.D.   On: 09/09/2017 13:17   Ct Head Wo Contrast  Result Date: 09/08/2017 CLINICAL DATA:  71 y/o M; 2 days of gait problems and right arm weakness for 1 day. EXAM: CT HEAD WITHOUT CONTRAST TECHNIQUE: Contiguous axial images were obtained from the base of the skull through the vertex without intravenous contrast. COMPARISON:  None. FINDINGS: Brain: No evidence of acute infarction, hemorrhage, hydrocephalus, extra-axial collection or mass lesion/mass effect. Nonspecific foci of hypoattenuation in subcortical and periventricular white matter are compatible with mild chronic microvascular ischemic changes and there is mild parenchymal volume  loss of the brain for age. Vascular: Calcific atherosclerosis of carotid siphons. No hyperdense vessel identified. Skull: Normal. Negative for fracture or focal lesion. Sinuses/Orbits: No acute finding. Other: Opacification of external auditory canals, likely cerumen. IMPRESSION: 1. No acute intracranial abnormality identified. If symptoms persist or if clinically indicated MRI is more sensitive for stroke. 2. Mild chronic microvascular ischemic changes and parenchymal volume loss of the brain for age. Electronically Signed   By:  Mitzi Hansen M.D.   On: 09/08/2017 22:13   Mr Maxine Glenn Neck W Wo Contrast  Result Date: 09/09/2017 CLINICAL DATA:  71 year old male with chorea movements of the right arm beginning 3 days ago. EXAM: MRI HEAD WITHOUT CONTRAST MRA HEAD WITHOUT CONTRAST MRA NECK WITHOUT AND WITH CONTRAST TECHNIQUE: Multiplanar, multiecho pulse sequences of the brain and surrounding structures were obtained without intravenous contrast. Angiographic images of the Circle of Willis were obtained using MRA technique without intravenous contrast. Angiographic images of the neck were obtained using MRA technique without and with intravenous contrast. Carotid stenosis measurements (when applicable) are obtained utilizing NASCET criteria, using the distal internal carotid diameter as the denominator. CONTRAST:  15 mL MultiHance COMPARISON:  Head CT without contrast 09/08/2017. FINDINGS: MRI HEAD FINDINGS Brain: Small nodular cortical and subcortical white matter foci of restricted diffusion along the superolateral left motor strip (series 3, image 35 and series 4, image 14), and in the posterior left parietal lobe with little to no associated T2 or FLAIR hyperintensity. No contralateral right hemisphere restricted diffusion. Questionable tiny focus of restricted diffusion in the central right cerebellum (series 4, image 9). No acute or chronic intracranial hemorrhage. No mass effect. No evidence of mass  lesion, ventriculomegaly, extra-axial collection. Cervicomedullary junction and pituitary are within normal limits. Patchy and confluent as well as widely scattered foci of abnormal cerebral white matter T2 and FLAIR hyperintensity in both hemispheres. No cortical encephalomalacia identified. Signal in the bilateral deep gray matter nuclei appears normal. Normal brainstem. Vascular: Major intracranial vascular flow voids are preserved. Skull and upper cervical spine: Negative. Normal bone marrow signal. Sinuses/Orbits: Normal orbits soft tissues. Scattered mild areas of paranasal sinus mucosal thickening or small retention cyst. Other: Grossly normal visible internal auditory structures. Mastoid air cells are clear. Scalp and face soft tissues appear negative. MRA NECK FINDINGS Precontrast time-of-flight images demonstrate antegrade flow in both carotid and vertebral arteries throughout the neck. The right vertebral artery appears dominant. Post-contrast neck MRA images reveal a 3 vessel arch configuration, although the left vertebral arises very near the arch. No great vessel origin stenosis suspected. Both common carotid arteries, carotid bifurcations, and cervical ICAs are normal for age. Minimal atherosclerotic irregularity and no stenosis in the neck. The right vertebral artery is dominant and patent throughout the neck without stenosis although its origin is not well visualized. On source images the left vertebral artery is seen to arise very proximal near the aortic arch. The proximal left vertebral artery is poorly visualized on MIPS images. The left vertebral remains patent to the skullbase without significant stenosis suspected. MRA HEAD FINDINGS The distal left vertebral artery terminates in PICA. The distal right vertebral artery supplies the basilar. No distal right vertebral artery stenosis. The basilar is patent without stenosis. Fetal type bilateral PCA origins, more so the left. SCA origins are  patent. Bilateral PCA branches are within normal limits. Antegrade flow in both ICA siphons. Mild siphon irregularity without stenosis. Normal ophthalmic and posterior communicating artery origins. Patent carotid termini. Normal MCA and ACA origins. Anterior communicating artery and visible ACA branches are within normal limits. Left MCA M1 segment, bifurcation, and visible left MCA branches are within normal limits. Right MCA M1 segment, bifurcation, and visible right MCA branches are within normal limits. IMPRESSION: 1. Positive for several small acute infarcts in the posterior left MCA territory, including involvement of the posterolateral left motor strip. 2. Questionable punctate acute or subacute lacunar infarct superimposed in the right cerebellum, but this may  be artifact. 3. No associated hemorrhage or mass effect. 4. Underlying moderately advanced for age but nonspecific bilateral cerebral white matter T2 and FLAIR hyperintensity. 5. Normal noncontrast MRI appearance of the deep gray matter nuclei and brainstem. 6. Negative for age Neck MRA. 7. Negative for age intracranial MRA. Visible left MCA branches are normal. 8. Incidental normal vascular anatomic variants including dominant right vertebral artery which supplies the basilar, non dominant the left arises near the aortic arch and terminates in the left PICA, and fetal type bilateral PCA origins. Electronically Signed   By: Odessa Fleming M.D.   On: 09/09/2017 13:49   Mr Brain Wo Contrast  Result Date: 09/09/2017 CLINICAL DATA:  71 year old male with chorea movements of the right arm beginning 3 days ago. EXAM: MRI HEAD WITHOUT CONTRAST MRA HEAD WITHOUT CONTRAST MRA NECK WITHOUT AND WITH CONTRAST TECHNIQUE: Multiplanar, multiecho pulse sequences of the brain and surrounding structures were obtained without intravenous contrast. Angiographic images of the Circle of Willis were obtained using MRA technique without intravenous contrast. Angiographic images  of the neck were obtained using MRA technique without and with intravenous contrast. Carotid stenosis measurements (when applicable) are obtained utilizing NASCET criteria, using the distal internal carotid diameter as the denominator. CONTRAST:  15 mL MultiHance COMPARISON:  Head CT without contrast 09/08/2017. FINDINGS: MRI HEAD FINDINGS Brain: Small nodular cortical and subcortical white matter foci of restricted diffusion along the superolateral left motor strip (series 3, image 35 and series 4, image 14), and in the posterior left parietal lobe with little to no associated T2 or FLAIR hyperintensity. No contralateral right hemisphere restricted diffusion. Questionable tiny focus of restricted diffusion in the central right cerebellum (series 4, image 9). No acute or chronic intracranial hemorrhage. No mass effect. No evidence of mass lesion, ventriculomegaly, extra-axial collection. Cervicomedullary junction and pituitary are within normal limits. Patchy and confluent as well as widely scattered foci of abnormal cerebral white matter T2 and FLAIR hyperintensity in both hemispheres. No cortical encephalomalacia identified. Signal in the bilateral deep gray matter nuclei appears normal. Normal brainstem. Vascular: Major intracranial vascular flow voids are preserved. Skull and upper cervical spine: Negative. Normal bone marrow signal. Sinuses/Orbits: Normal orbits soft tissues. Scattered mild areas of paranasal sinus mucosal thickening or small retention cyst. Other: Grossly normal visible internal auditory structures. Mastoid air cells are clear. Scalp and face soft tissues appear negative. MRA NECK FINDINGS Precontrast time-of-flight images demonstrate antegrade flow in both carotid and vertebral arteries throughout the neck. The right vertebral artery appears dominant. Post-contrast neck MRA images reveal a 3 vessel arch configuration, although the left vertebral arises very near the arch. No great vessel  origin stenosis suspected. Both common carotid arteries, carotid bifurcations, and cervical ICAs are normal for age. Minimal atherosclerotic irregularity and no stenosis in the neck. The right vertebral artery is dominant and patent throughout the neck without stenosis although its origin is not well visualized. On source images the left vertebral artery is seen to arise very proximal near the aortic arch. The proximal left vertebral artery is poorly visualized on MIPS images. The left vertebral remains patent to the skullbase without significant stenosis suspected. MRA HEAD FINDINGS The distal left vertebral artery terminates in PICA. The distal right vertebral artery supplies the basilar. No distal right vertebral artery stenosis. The basilar is patent without stenosis. Fetal type bilateral PCA origins, more so the left. SCA origins are patent. Bilateral PCA branches are within normal limits. Antegrade flow in both ICA siphons. Mild siphon  irregularity without stenosis. Normal ophthalmic and posterior communicating artery origins. Patent carotid termini. Normal MCA and ACA origins. Anterior communicating artery and visible ACA branches are within normal limits. Left MCA M1 segment, bifurcation, and visible left MCA branches are within normal limits. Right MCA M1 segment, bifurcation, and visible right MCA branches are within normal limits. IMPRESSION: 1. Positive for several small acute infarcts in the posterior left MCA territory, including involvement of the posterolateral left motor strip. 2. Questionable punctate acute or subacute lacunar infarct superimposed in the right cerebellum, but this may be artifact. 3. No associated hemorrhage or mass effect. 4. Underlying moderately advanced for age but nonspecific bilateral cerebral white matter T2 and FLAIR hyperintensity. 5. Normal noncontrast MRI appearance of the deep gray matter nuclei and brainstem. 6. Negative for age Neck MRA. 7. Negative for age  intracranial MRA. Visible left MCA branches are normal. 8. Incidental normal vascular anatomic variants including dominant right vertebral artery which supplies the basilar, non dominant the left arises near the aortic arch and terminates in the left PICA, and fetal type bilateral PCA origins. Electronically Signed   By: Odessa Fleming M.D.   On: 09/09/2017 13:49   Dg Esophagus  Result Date: 09/09/2017 CLINICAL DATA:  71 year old male with history of 20 pound weight loss. Dysphagia. Feelings of regurgitation and difficulty ingesting food for several years. EXAM: ESOPHOGRAM/BARIUM SWALLOW TECHNIQUE: Single contrast examination was performed using  thin barium. FLUOROSCOPY TIME:  Fluoroscopy Time:  1 minutes and 36 seconds Radiation Exposure Index (if provided by the fluoroscopic device): 8.5 mGy COMPARISON:  None. FINDINGS: Limited single contrast esophagram demonstrates a structurally normal esophagus, with no esophageal mass, stricture or esophageal ring. No hiatal hernia. Intermittent failure to propagate a normal primary peristaltic wave was noted. No tertiary contractions. A 13 mm barium tablet was administered, which passed readily into the stomach. IMPRESSION: 1. Mild nonspecific esophageal motility disorder. 2. Otherwise, normal single contrast esophagram. Electronically Signed   By: Trudie Reed M.D.   On: 09/09/2017 16:06   Nm Myocar Multi W/spect W/wall Motion / Ef  Result Date: 09/11/2017 CLINICAL DATA:  History of stroke, smoking, hypertension and hyperlipidemia. EXAM: MYOCARDIAL IMAGING WITH SPECT (REST AND PHARMACOLOGIC-STRESS) GATED LEFT VENTRICULAR WALL MOTION STUDY LEFT VENTRICULAR EJECTION FRACTION TECHNIQUE: Standard myocardial SPECT imaging was performed after resting intravenous injection of 10 mCi Tc-39m tetrofosmin. Subsequently, intravenous infusion of Lexiscan was performed under the supervision of the Cardiology staff. At peak effect of the drug, 30 mCi Tc-25m tetrofosmin was injected  intravenously and standard myocardial SPECT imaging was performed. Quantitative gated imaging was also performed to evaluate left ventricular wall motion, and estimate left ventricular ejection fraction. COMPARISON:  None. FINDINGS: Raw images: Mild GI attenuation is seen on both provided rest stress images. There is no significant chest wall attenuation. No significant patient motion artifact. Perfusion: There is a grossly matched moderate-sized area of non perfusion involving the left ventricular apex, worrisome for prior infarction. Additionally, there is matched mild decreased profusion involving the mid and basilar aspects of the inferior wall of the left ventricle, worrisome for an additional area of prior infarction. There is a small area of mismatch perfusion involving the septal mid aspect of the anterior wall of the left ventricle worrisome for an area of pharmacologically induced ischemia. Wall Motion: The left ventricle is severely dilated. There is global hypokinesia of the left ventricle with relative akinesia involving the left ventricular apex, at the location of suspected prior infarction. Left Ventricular Ejection Fraction: 26 %  End diastolic volume 171 ml End systolic volume 127 ml IMPRESSION: 1. Findings worrisome for prior infarctions involving the left ventricular apex as well as the mid and basilar aspects of the inferior wall of the left ventricle. 2. Findings worrisome for small area of pharmacologically induced ischemia involving the mid, septal aspect of the anterior wall of the left ventricle. 3. Severe left ventricular dilatation. 4. Global hypokinesia with relative akinesia involving the left ventricular apex, at the location of suspected prior infarction. 5. Ejection fraction - 26%. 6. Non invasive risk stratification*: High *2012 Appropriate Use Criteria for Coronary Revascularization Focused Update: J Am Coll Cardiol. 2012;59(9):857-881.  http://[email or web address]?[email or web address] Electronically Signed   By: Simonne Come M.D.   On: 09/11/2017 12:19   Mr Maxine Glenn Head Wo Contrast  Result Date: 09/09/2017 CLINICAL DATA:  71 year old male with chorea movements of the right arm beginning 3 days ago. EXAM: MRI HEAD WITHOUT CONTRAST MRA HEAD WITHOUT CONTRAST MRA NECK WITHOUT AND WITH CONTRAST TECHNIQUE: Multiplanar, multiecho pulse sequences of the brain and surrounding structures were obtained without intravenous contrast. Angiographic images of the Circle of Willis were obtained using MRA technique without intravenous contrast. Angiographic images of the neck were obtained using MRA technique without and with intravenous contrast. Carotid stenosis measurements (when applicable) are obtained utilizing NASCET criteria, using the distal internal carotid diameter as the denominator. CONTRAST:  15 mL MultiHance COMPARISON:  Head CT without contrast 09/08/2017. FINDINGS: MRI HEAD FINDINGS Brain: Small nodular cortical and subcortical white matter foci of restricted diffusion along the superolateral left motor strip (series 3, image 35 and series 4, image 14), and in the posterior left parietal lobe with little to no associated T2 or FLAIR hyperintensity. No contralateral right hemisphere restricted diffusion. Questionable tiny focus of restricted diffusion in the central right cerebellum (series 4, image 9). No acute or chronic intracranial hemorrhage. No mass effect. No evidence of mass lesion, ventriculomegaly, extra-axial collection. Cervicomedullary junction and pituitary are within normal limits. Patchy and confluent as well as widely scattered foci of abnormal cerebral white matter T2 and FLAIR hyperintensity in both hemispheres. No cortical encephalomalacia identified. Signal in the bilateral deep gray matter nuclei appears normal. Normal brainstem. Vascular: Major intracranial vascular flow voids are preserved. Skull and upper cervical  spine: Negative. Normal bone marrow signal. Sinuses/Orbits: Normal orbits soft tissues. Scattered mild areas of paranasal sinus mucosal thickening or small retention cyst. Other: Grossly normal visible internal auditory structures. Mastoid air cells are clear. Scalp and face soft tissues appear negative. MRA NECK FINDINGS Precontrast time-of-flight images demonstrate antegrade flow in both carotid and vertebral arteries throughout the neck. The right vertebral artery appears dominant. Post-contrast neck MRA images reveal a 3 vessel arch configuration, although the left vertebral arises very near the arch. No great vessel origin stenosis suspected. Both common carotid arteries, carotid bifurcations, and cervical ICAs are normal for age. Minimal atherosclerotic irregularity and no stenosis in the neck. The right vertebral artery is dominant and patent throughout the neck without stenosis although its origin is not well visualized. On source images the left vertebral artery is seen to arise very proximal near the aortic arch. The proximal left vertebral artery is poorly visualized on MIPS images. The left vertebral remains patent to the skullbase without significant stenosis suspected. MRA HEAD FINDINGS The distal left vertebral artery terminates in PICA. The distal right vertebral artery supplies the basilar. No distal right vertebral artery stenosis. The basilar is patent without stenosis. Fetal type bilateral PCA origins, more so the left.  SCA origins are patent. Bilateral PCA branches are within normal limits. Antegrade flow in both ICA siphons. Mild siphon irregularity without stenosis. Normal ophthalmic and posterior communicating artery origins. Patent carotid termini. Normal MCA and ACA origins. Anterior communicating artery and visible ACA branches are within normal limits. Left MCA M1 segment, bifurcation, and visible left MCA branches are within normal limits. Right MCA M1 segment, bifurcation, and visible  right MCA branches are within normal limits. IMPRESSION: 1. Positive for several small acute infarcts in the posterior left MCA territory, including involvement of the posterolateral left motor strip. 2. Questionable punctate acute or subacute lacunar infarct superimposed in the right cerebellum, but this may be artifact. 3. No associated hemorrhage or mass effect. 4. Underlying moderately advanced for age but nonspecific bilateral cerebral white matter T2 and FLAIR hyperintensity. 5. Normal noncontrast MRI appearance of the deep gray matter nuclei and brainstem. 6. Negative for age Neck MRA. 7. Negative for age intracranial MRA. Visible left MCA branches are normal. 8. Incidental normal vascular anatomic variants including dominant right vertebral artery which supplies the basilar, non dominant the left arises near the aortic arch and terminates in the left PICA, and fetal type bilateral PCA origins. Electronically Signed   By: Odessa Fleming M.D.   On: 09/09/2017 13:49    Lab Data:  CBC:  Recent Labs Lab 09/08/17 2008 09/08/17 2028 09/09/17 9604 09/10/17 0524 09/11/17 0332 09/12/17 0519  WBC 5.5  --  6.3 4.7 4.8 5.2  NEUTROABS 3.4  --   --   --   --   --   HGB 13.7 16.3 14.7 13.6 13.4 13.9  HCT 42.2 48.0 43.4 40.5 40.4 41.6  MCV 98.8  --  99.5 98.3 98.3 99.0  PLT 262  --  277 263 305 308   Basic Metabolic Panel:  Recent Labs Lab 09/08/17 2008 09/08/17 2028 09/09/17 0925 09/10/17 0524 09/11/17 0332 09/12/17 0519  NA 135 138 136 136 138 138  K 3.8 3.6 3.7 4.0 4.0 4.0  CL 101 101 101 102 100* 101  CO2 24  --  24 28 29 28   GLUCOSE 94 92 108* 95 97 96  BUN 6 7 7 9 10 8   CREATININE 1.32* 1.20 1.16 1.20 1.16 1.19  CALCIUM 9.4  --  9.1 9.1 9.4 9.2   GFR: Estimated Creatinine Clearance: 53.2 mL/min (by C-G formula based on SCr of 1.19 mg/dL). Liver Function Tests:  Recent Labs Lab 09/08/17 2008  AST 23  ALT 14*  ALKPHOS 63  BILITOT 0.8  PROT 7.3  ALBUMIN 4.0   No results for  input(s): LIPASE, AMYLASE in the last 168 hours. No results for input(s): AMMONIA in the last 168 hours. Coagulation Profile:  Recent Labs Lab 09/08/17 2008  INR 1.00   Cardiac Enzymes:  Recent Labs Lab 09/09/17 0351 09/09/17 0925 09/09/17 1614  TROPONINI 0.03* <0.03 <0.03   BNP (last 3 results) No results for input(s): PROBNP in the last 8760 hours. HbA1C: No results for input(s): HGBA1C in the last 72 hours. CBG: No results for input(s): GLUCAP in the last 168 hours. Lipid Profile: No results for input(s): CHOL, HDL, LDLCALC, TRIG, CHOLHDL, LDLDIRECT in the last 72 hours. Thyroid Function Tests: No results for input(s): TSH, T4TOTAL, FREET4, T3FREE, THYROIDAB in the last 72 hours. Anemia Panel: No results for input(s): VITAMINB12, FOLATE, FERRITIN, TIBC, IRON, RETICCTPCT in the last 72 hours. Urine analysis:    Component Value Date/Time   COLORURINE STRAW (A) 09/08/2017 2045  APPEARANCEUR CLEAR 09/08/2017 2045   LABSPEC 1.004 (L) 09/08/2017 2045   PHURINE 5.0 09/08/2017 2045   GLUCOSEU NEGATIVE 09/08/2017 2045   HGBUR LARGE (A) 09/08/2017 2045   BILIRUBINUR NEGATIVE 09/08/2017 2045   KETONESUR NEGATIVE 09/08/2017 2045   PROTEINUR NEGATIVE 09/08/2017 2045   UROBILINOGEN 1.0 04/28/2013 1205   NITRITE NEGATIVE 09/08/2017 2045   LEUKOCYTESUR NEGATIVE 09/08/2017 2045     Ripudeep Rai M.D. [facility] 09/12/2017, 1:25 PM  Pager: [phone] Between 7am to 7pm - call Pager - [phone]  After 7pm go to [email or web address] - password [email or web address]  Call night coverage person covering after 7pm

## 2017-09-13 ENCOUNTER — Encounter (HOSPITAL_COMMUNITY)
Admission: EM | Disposition: A | Discharge status: Home / Self Care | Payer: Self-pay | Source: Home / Self Care | Attending: Internal Medicine

## 2017-09-13 LAB — HEPARIN LEVEL (UNFRACTIONATED): Heparin Unfractionated: 0.33 IU/mL (ref 0.30–0.70)

## 2017-09-13 LAB — CBC
HEMATOCRIT: 42.2 % (ref 39.0–52.0)
Hemoglobin: 14 g/dL (ref 13.0–17.0)
MCH: 32.8 pg (ref 26.0–34.0)
MCHC: 33.2 g/dL (ref 30.0–36.0)
MCV: 98.8 fL (ref 78.0–100.0)
PLATELETS: 317 10*3/uL (ref 150–400)
RBC: 4.27 MIL/uL (ref 4.22–5.81)
RDW: 14.2 % (ref 11.5–15.5)
WBC: 4.9 10*3/uL (ref 4.0–10.5)

## 2017-09-13 SURGERY — ECHOCARDIOGRAM, TRANSESOPHAGEAL
Anesthesia: Moderate Sedation

## 2017-09-13 MED ORDER — METOPROLOL SUCCINATE ER 25 MG PO TB24: 25.0000 mg | ORAL_TABLET | Freq: Every day | ORAL | 3 refills | Status: DC

## 2017-09-13 MED ORDER — ATORVASTATIN CALCIUM 20 MG PO TABS: 20.0000 mg | ORAL_TABLET | Freq: Every day | ORAL | 4 refills | Status: DC

## 2017-09-13 MED ORDER — LOSARTAN POTASSIUM 25 MG PO TABS: 25.0000 mg | ORAL_TABLET | Freq: Every day | ORAL | 3 refills | Status: DC

## 2017-09-13 MED ORDER — APIXABAN 5 MG PO TABS
5.0000 mg | ORAL_TABLET | Freq: Two times a day (BID) | ORAL | Status: DC
  Administered 2017-09-13: 5 mg via ORAL
  Filled 2017-09-13: qty 1

## 2017-09-13 MED ORDER — ASPIRIN 81 MG PO TBEC: 81.0000 mg | DELAYED_RELEASE_TABLET | Freq: Every day | ORAL | 3 refills | Status: DC

## 2017-09-13 MED ORDER — APIXABAN 5 MG PO TABS: 5.0000 mg | ORAL_TABLET | Freq: Two times a day (BID) | ORAL | 2 refills | Status: DC

## 2017-09-13 NOTE — Progress Notes (Signed)
STROKE TEAM PROGRESS NOTE   SUBJECTIVE (INTERVAL HISTORY) His family is at the bedside. No acute event overnight.    OBJECTIVE Temp:  [98.1 F (36.7 C)-98.8 F (37.1 C)] 98.8 F (37.1 C) (10/01 0524) Pulse Rate:  [67-100] 99 (10/01 0524) Cardiac Rhythm: Atrial flutter (10/01 0700) Resp:  [18] 18 (10/01 0524) BP: (108-131)/(60-86) 131/86 (10/01 0524) SpO2:  [96 %-100 %] 99 % (10/01 0524)  CBC:   Recent Labs Lab 09/08/17 2008  09/12/17 0519 09/13/17 0515  WBC 5.5  < > 5.2 4.9  NEUTROABS 3.4  --   --   --   HGB 13.7  < > 13.9 14.0  HCT 42.2  < > 41.6 42.2  MCV 98.8  < > 99.0 98.8  PLT 262  < > 308 317  < > = values in this interval not displayed.  Basic Metabolic Panel:   Recent Labs Lab 09/11/17 0332 09/12/17 0519  NA 138 138  K 4.0 4.0  CL 100* 101  CO2 29 28  GLUCOSE 97 96  BUN 10 8  CREATININE 1.16 1.19  CALCIUM 9.4 9.2    Lipid Panel:     Component Value Date/Time   CHOL 164 09/09/2017 0925   TRIG 60 09/09/2017 0925   HDL 64 09/09/2017 0925   CHOLHDL 2.6 09/09/2017 0925   VLDL 12 09/09/2017 0925   LDLCALC 88 09/09/2017 0925   HgbA1c:  Lab Results  Component Value Date   HGBA1C 5.1 09/09/2017   Urine Drug Screen: No results found for: LABOPIA, COCAINSCRNUR, LABBENZ, AMPHETMU, THCU, LABBARB  Alcohol Level No results found for: ETH  IMAGING I have personally reviewed the radiological images below and agree with the radiology interpretations.  Ct Head Wo Contrast 09/08/2017 IMPRESSION: 1. No acute intracranial abnormality identified. If symptoms persist or if clinically indicated MRI is more sensitive for stroke. 2. Mild chronic microvascular ischemic changes and parenchymal volume loss of the brain for age.   DG Chest 2 View 09/09/2017 IMPRESSION: No active cardiopulmonary disease.  MRI Brain Wo Contrast MRA Brain Wo Contrast 09/09/2017 IMPRESSION: 1. Positive for several small acute infarcts in the posterior left MCA territory, including  involvement of the posterolateral left motor strip. 2. Questionable punctate acute or subacute lacunar infarct superimposed in the right cerebellum, but this may be artifact. 3. No associated hemorrhage or mass effect. 4. Underlying moderately advanced for age but nonspecific bilateral cerebral white matter T2 and FLAIR hyperintensity. 5. Normal noncontrast MRI appearance of the deep gray matter nuclei and brainstem. 6. Negative for age Neck MRA. 7. Negative for age intracranial MRA. Visible left MCA branches are normal. 8. Incidental normal vascular anatomic variants including dominant right vertebral artery which supplies the basilar, non dominant the left arises near the aortic arch and terminates in the left PICA, and fetal type bilateral PCA origins.  TTE 09/09/2017 - Left ventricle: The cavity size was normal. Wall thickness was   normal. Systolic function was severely reduced. The estimated   ejection fraction was in the range of 25% to 30%. Severe diffuse   hypokinesis with no identifiable regional variations. No evidence   of thrombus. - Right ventricle: The cavity size was mildly dilated. Systolic   function was moderately reduced. - Right atrium: The atrium was mildly dilated.  LE venous doppler - no DVT       PHYSICAL EXAM  Temp:  [98.1 F (36.7 C)-98.8 F (37.1 C)] 98.8 F (37.1 C) (10/01 0524) Pulse Rate:  [67-100] 99 (  10/01 0524) Resp:  [18] 18 (10/01 0524) BP: (108-131)/(60-86) 131/86 (10/01 0524) SpO2:  [96 %-100 %] 99 % (10/01 0524)  General - Well nourished, well developed elderly african [place] male , in no apparent distress.  Ophthalmologic - Fundi not visualized due to noncooperation.  Cardiovascular - Regular rate and rhythm.  Mental Status -  Level of arousal and orientation to time, place, and person were intact. Language including expression, naming, repetition, comprehension was assessed and found intact. Fund of Knowledge was assessed and was  intact.  Cranial Nerves II - XII - II - Visual field intact OU. III, IV, VI - Extraocular movements intact. V - Facial sensation intact bilaterally. VII - mild right facial droop. VIII - Hearing   intact bilaterally. X - Palate elevates symmetrically, mild dysarthria. XI - Chin turning & shoulder shrug intact bilaterally. XII - Tongue protrusion intact.  Motor Strength - The patient's strength was normal in all extremities and pronator drift was absent.  Bulk was normal and fasciculations were absent.   Motor Tone - Muscle tone was assessed at the neck and appendages and was normal.  Reflexes - The patient's reflexes were 1+ in all extremities and he had no pathological reflexes.  Sensory - Light touch, temperature/pinprick were assessed and were symmetrical.    Coordination - The patient had normal movements in the hands and feet with no ataxia or dysmetria.  Tremor was absent.  Gait and Station - deferred.   ASSESSMENT/PLAN Nathan Perez is a 71 y.o. male with history of tobacco abuse and no known other vascular risk factors presents to the emergency room after 2 falls today. He did not receive IV t-PA due to arriving outside of the tPA treatment window.   Stroke: 3 x punctate L MCA territory and 1 x right cerebellum acute infarcts, likely cardioembolic, of undetermined etiology   Resultant  mild right facial droop and dysarthria  CT head: no acute stroke  MRI head: Multiple small L MCA territory acute infarcts including the posterolateral left motor strip and right cerebellum.  MRA head and neck: no LVO or high-grade stenosis  LE venous doppler - no DVT  2D Echo: EF 25-30%, no thrombus  Cardiology following for cardiomyopathy with low EF  Recommend TEE outpatient in 4 weeks  LDL 88  HgbA1c 5.1  Lovenox 40 mg sq daily for VTE prophylaxis Diet heart healthy/carb modified Room service appropriate? Yes; Fluid consistency: Thin Diet - low sodium heart  healthy  No antithrombotic prior to admission, now on aspirin 325 mg daily. Start eliquis and stop aspirin.  Patient counseled to be compliant with his antithrombotic medications  Ongoing aggressive stroke risk factor management  Therapy recommendations: None  Disposition: Pending  Cardiomyopathy   TTE with EF 25-30%  Recommend cardiology consult Recommend TEE outpatient 4 weeks  Hypertension  Stable  Permissive hypertension (OK if < 220/120) but gradually normalize in 5-7 days  Long-term BP goal normotensive  Hyperlipidemia  Home meds:  none  LDL 88, goal < 70  Add atorvastatin 20 mg PO daily  Continue statin at discharge  Tobacco abuse  Current smoker  Smoking cessation counseling provided  Pt is willing to quit  Other Stroke Risk Factors  Advanced age  ETOH use, advised to drink no more than 2 drink(s) a day  Other Active Problems  None  Hospital day # 2 I have personally examined this patient, reviewed notes, independently viewed imaging studies, participated in medical decision making and plan  of care.ROS completed by me personally and pertinent positives fully documented  I have made any additions or clarifications directly to the above note. Discussed  with Dr.Rait. Recommend start eliquis for stroke prevention and stop aspirin. Outpatient TEE as per cardiology Dr. Donnie Aho. Greater than 50% time during this 25 minute visit was spent on counseling and coordination of care about his embolic stroke and answered questions. Stroke team will sign off. Follow-up as an outpatient with Dr. Roda Shutters in 6 weeks.  Delia Heady, MD Medical Director [facility] Pager: [phone] 09/13/2017 2:51 PM  Stroke team will sign off at this time.   To contact Stroke Continuity provider, please refer to [email or web address]. After hours, contact General Neurology

## 2017-09-13 NOTE — Progress Notes (Signed)
Physical Therapy Treatment Patient Details Name: Nathan Perez MRN: [id number] DOB: 08/30/1946 Today's Date: 09/13/2017    History of Present Illness Pt is a 71 y.o. male, smoker with c/o fall outside earlier today at about 5;30pm,  Denies dizziness, syncope, cp, palp, sob, n/v, diarrhea, brbpr.  Went inside today and fell in his room. CT-. MRI pending.    PT Comments    Patient seen for mobility progression. Tolerated well. Mobilizing with modest deficits but no overt issues. Will continue to see and progress as tolerated. Anticipate patient will be safe for d/c home.   Follow Up Recommendations  Outpatient PT;Other (comment) (NEURO OP PT)     Equipment Recommendations  None recommended by PT    Recommendations for Other Services       Precautions / Restrictions Precautions Precautions: Fall Restrictions Weight Bearing Restrictions: No    Mobility  Bed Mobility Overal bed mobility: Modified Independent                Transfers Overall transfer level: Needs assistance Equipment used: None Transfers: Sit to/from Stand;Stand Pivot Transfers Sit to Stand: Modified independent (Device/Increase time) Stand pivot transfers: Supervision       General transfer comment: supervision for safety  Ambulation/Gait Ambulation/Gait assistance: Supervision Ambulation Distance (Feet): 310 Feet Assistive device: None Gait Pattern/deviations: Step-through pattern;Decreased step length - right;Decreased step length - left;Decreased stride length;Drifts right/left;Narrow base of support Gait velocity: decreased   General Gait Details: no over LOB or need for assist, supervision for line management   Stairs Stairs: Yes   Stair Management: One rail Left Number of Stairs: 4 General stair comments: no instability or concerns  Wheelchair Mobility    Modified Rankin (Stroke Patients Only) Modified Rankin (Stroke Patients Only) Pre-Morbid Rankin Score: Slight  disability Modified Rankin: Moderate disability     Balance Overall balance assessment: Needs assistance Sitting-balance support: Feet supported Sitting balance-Leahy Scale: Good     Standing balance support: During functional activity;No upper extremity supported Standing balance-Leahy Scale: Good               High level balance activites: Side stepping;Backward walking;Direction changes;Turns;Sudden stops;Head turns High Level Balance Comments: steady with all aspects of higher level balance Standardized Balance Assessment Standardized Balance Assessment : Dynamic Gait Index   Dynamic Gait Index Level Surface: Normal Change in Gait Speed: Normal Gait with Horizontal Head Turns: Mild Impairment Gait with Vertical Head Turns: Mild Impairment Gait and Pivot Turn: Mild Impairment Step Over Obstacle: Mild Impairment Step Around Obstacles: Normal Steps: Mild Impairment Total Score: 19      Cognition Arousal/Alertness: Awake/alert Behavior During Therapy: WFL for tasks assessed/performed Overall Cognitive Status: Within Functional Limits for tasks assessed                                        Exercises      General Comments General comments (skin integrity, edema, etc.): educated on BEFAST signs and spoke with patient about safety and mobility expectations upon discharge.      Pertinent Vitals/Pain Pain Assessment: No/denies pain    Home Living                      Prior Function            PT Goals (current goals can now be found in the care plan section) Acute Rehab PT Goals Patient  Stated Goal: to go home PT Goal Formulation: With patient Time For Goal Achievement: 09/23/17 Potential to Achieve Goals: Good Progress towards PT goals: Progressing toward goals    Frequency    Min 4X/week      PT Plan Current plan remains appropriate    Co-evaluation              AM-PAC PT "6 Clicks" Daily Activity  Outcome  Measure  Difficulty turning over in bed (including adjusting bedclothes, sheets and blankets)?: None Difficulty moving from lying on back to sitting on the side of the bed? : None Difficulty sitting down on and standing up from a chair with arms (e.g., wheelchair, bedside commode, etc,.)?: None Help needed moving to and from a bed to chair (including a wheelchair)?: None Help needed walking in hospital room?: A Little Help needed climbing 3-5 steps with a railing? : A Little 6 Click Score: 22    End of Session   Activity Tolerance: Patient tolerated treatment well Patient left: with call bell/phone within reach;Other (comment) (in bathroom) Nurse Communication: Mobility status;Other (comment) (pt in bathroom) PT Visit Diagnosis: Other abnormalities of gait and mobility (R26.89)     Time: 7425-9563 PT Time Calculation (min) (ACUTE ONLY): 16 min  Charges:  $Gait Training: 8-22 mins                    G Codes:       Charlotte Crumb, PT DPT  Board Certified Neurologic Specialist [phone]    Fabio Asa 09/13/2017, 10:10 AM

## 2017-09-13 NOTE — Progress Notes (Signed)
ANTICOAGULATION CONSULT NOTE - Initial Consult  Pharmacy Consult for Eliquis Indication: Atrial flutter/CVA  No Known Allergies  Patient Measurements: Height:  (185.4 cm) Weight: 147 lb 12.8 oz (67 kg) IBW/kg (Calculated) : 79.9   Vital Signs: Temp: 98.8 F (37.1 C) (10/01 0524) Temp Source: Oral (10/01 0524) BP: 131/86 (10/01 0524) Pulse Rate: 99 (10/01 0524)  Labs:  Recent Labs  09/11/17 0332  09/11/17 2232 09/12/17 0519 09/13/17 0515  HGB 13.4  --   --  13.9 14.0  HCT 40.4  --   --  41.6 42.2  PLT 305  --   --  308 317  HEPARINUNFRC 0.45  < > 0.35 0.37 0.33  CREATININE 1.16  --   --  1.19  --   < > = values in this interval not displayed.  Estimated Creatinine Clearance: 53.2 mL/min (by C-G formula based on SCr of 1.19 mg/dL).  Assessment: 71 yoM s/p fall, found to have new CVA and atrial flutter. Pharmacy initially consulted for heparin, now transitioning to Eliquis. CBC stable WNL; renal function stable; no bleeding documented  Goal of Therapy:  Heparin level 0.3-0.5 units/ml Monitor platelets by anticoagulation protocol: Yes   Plan:  - Eliquis  PO BID - Monitor for s/sx of bleeding  Ruben Im, PharmD Clinical Pharmacist 09/13/2017 9:30 AM

## 2017-09-13 NOTE — Progress Notes (Signed)
RN accidentally gave whole tablet of Losartan ( ) instead of a half tablet ( ) while giving meds this AM. RN immediately informed pt of situation. MD notified also. RN will continue to monitor.

## 2017-09-13 NOTE — Care Management Note (Signed)
Case Management Note  Patient Details  Name: Nathan Perez MRN: [id number] Date of Birth: 1946-05-06  Subjective/Objective:                    Action/Plan: CM consulted for patient starting on Eliquis. CM submitted a benefits check for the medication. CM provided him a 30 day free card for the Eliquis and informed him that a benefits check was submitted. CM following.   Expected Discharge Date:  09/13/17               Expected Discharge Plan:  OP Rehab  In-House Referral:     Discharge planning Services  CM Consult, Medication Assistance  Post Acute Care Choice:    Choice offered to:     DME Arranged:    DME Agency:     HH Arranged:    HH Agency:     Status of Service:  Completed, signed off  If discussed at [employer] of Stay Meetings, dates discussed:    Additional Comments:  Kermit Balo, RN 09/13/2017, 11:17 AM

## 2017-09-13 NOTE — Progress Notes (Signed)
Pt being discharged from hospital per orders from MD. Pt educated on discharge instructions. Pt verbalized understanding of instructions. All questions and concerns were addressed. Pt's IV was removed prior to discharge. Pt exited hospital via wheelchair. 

## 2017-09-13 NOTE — Progress Notes (Signed)
Benefits check revealed:   # 1.  S/W [facility] @ [employer] #  [phone]    1. ELIQUIS 5 MG BID   COVER- YES  CO-PAY- $ 8.35  TIER- 3 DRUG  PRIOR APPROVAL- NO   PHARMACY : [facility] AND [facility]   Will updated the patient. No further needs per CM.

## 2017-09-13 NOTE — Progress Notes (Signed)
  Speech Language Pathology Treatment: Cognitive-Linquistic  Patient Details Name: Nathan Perez MRN: [id number] DOB: March 09, 1946 Today's Date: 09/13/2017 Time: 9432-7614 SLP Time Calculation (min) (ACUTE ONLY): 25 min  Assessment / Plan / Recommendation Clinical Impression  Skilled treatment session focused on speech communication goals. SLP facilitated session by providing subjects for pt to verbalize about. Pt with 100% intelligibility at the simple conversation level. Of note, he states that he is eating more than when SLP previously treated him. Pt appears at baseline with speech intelligibility and cognitive function. Therefore ST to sign off.     HPI HPI: Pt is a 71 y.o.M with history of tobacco use who presents to the emergency department with complaints of 2 falls today. He reports over the past month he has felt "off balance". He states since Monday, September 24 he has had right upper extremity weakness where he dropped something that he was holding. He then had an episode where he felt like he did not have any control over his arm and there are weird spontaneous movements. He was alert and conscious during this episode. No numbness or tingling. No headache. Denies any recent head injury. He is not on antiplatelets or anticoagulants. Denies history of stroke. CT didn't reveal any acute intracranial abnormality but did reveal mild chronic microvascular ischemic changes and parenchymal volum loss of the brain for age. Pt passed stroke swallow screen.       SLP Plan  All goals met;Discharge SLP treatment due to (comment)       Recommendations                   Oral Care Recommendations: Oral care BID Follow up Recommendations: None SLP Visit Diagnosis: Dysarthria and anarthria (R47.1) Plan: All goals met;Discharge SLP treatment due to (comment)       [place] 09/13/2017, 10:57 AM

## 2017-09-20 ENCOUNTER — Other Ambulatory Visit: Payer: Self-pay

## 2017-09-20 NOTE — Patient Outreach (Signed)
[facility]) Care Management  09/20/2017  BROEDY OSBOURNE [birth year] [id number]     EMMI-Stroke RED ON EMMI ALERT Day # 6 Date: 09/19/17 Red Alert Reason: "Feeling worse overall? Yes"   Outreach attempt #1 to patient.  Spoke with patient. Reviewed and addressed red alert. Patient denies feeling worse. States he does not even recall responding to automated system. He voices that he has not made MD f/u appts but plans to do so. He voices that he still has d/c paperwork in his car. RN CM encouraged patient to review and refer to paperwork to schedule appts accordingly. He states he is still waiting to see when he will start outpt therapy. He denies any weakness or residual effects. RN CM reviewed with patient s/s of worsening condition and when to seek medical attention. He voiced understanding.His spouse will take him to appts. He states he has all his meds and understands how and when to take them. He denies any further RN CM needs or concerns at this time.        Plan: RN CM will notify [facility] administrative assistant of case status.    Antionette Fairy, RN,BSN,CCM [facility] Care Management Telephonic Care Management Coordinator Direct Phone: [phone] Toll Free: [phone] Fax: [phone]

## 2017-09-27 ENCOUNTER — Other Ambulatory Visit: Payer: Self-pay

## 2017-09-27 NOTE — Patient Outreach (Signed)
[facility]) Care Management  09/27/2017  Nathan Perez 11-21-1946 [id number]     EMMI-Stroke RED ON EMMI ALERT Day #13 Date: 09/26/17 Red Alert Reason: "Went to follow up appt? No"  Outreach attempt # 1 to patient. Patient states he is doing well since return home. Reviewed and addressed red alert with patient. He stets he is aware that he needs to make f/u appt but thought that he had plenty of time to do it. RN CM reviewed with patient per AVS summary timeframe of f/u appts. Patient states he still has paperwork and was able to review it while on the call with RN CM. He is aware that he needs to make all three f/u appts(PCP, cardiology and neurology) ASAP. Patient stets he will call today to make f/u appts. His wife will take him to appts. Patient confirmed that his PCP is Dr. Ronne Binning. Patient inquiring if rather or not he will have to have surgery. Advised patient that he would need to f/u with MDs regarding possibility of surgery. He voiced understanding. Patient has completed automated post discharge EMMI-Stroke calls. Patient verbalizes no further RN CM needs or concerns at this time.        Plan: RN CM will notify [facility] administrative assistant of case status.   Antionette Fairy, RN,BSN,CCM [facility] Care Management Telephonic Care Management Coordinator Direct Phone: [phone] Toll Free: [phone] Fax: [phone]

## 2017-09-29 ENCOUNTER — Ambulatory Visit: Payer: Medicare PPO | Attending: Internal Medicine | Admitting: Physical Therapy

## 2017-09-29 ENCOUNTER — Encounter: Payer: Self-pay | Admitting: Physical Therapy

## 2017-09-29 VITALS — BP 140/82

## 2017-09-29 DIAGNOSIS — R2689 Other abnormalities of gait and mobility: Secondary | ICD-10-CM

## 2017-09-29 NOTE — Therapy (Signed)
[facility] [address] [place], [place], [address] Phone: [phone]   Fax:  [phone]  Physical Therapy Evaluation  Patient Details  Name: Nathan Perez MRN: [id number] Date of Birth: [birth year] Referring Provider: Thad Ranger MD (hospitalist); Xu (neurologist)  Encounter Date: 09/29/2017      PT End of Session - 09/29/17 2102    Visit Number 1   Number of Visits 1   PT Start Time 0931   PT Stop Time 1027   PT Time Calculation (min) 56 min   Equipment Utilized During Treatment Gait belt   Activity Tolerance Patient tolerated treatment well   Behavior During Therapy WFL for tasks assessed/performed      History reviewed. No pertinent past medical history.  Past Surgical History:  Procedure Laterality Date  . HERNIA REPAIR      Vitals:   09/29/17 0942  BP: 140/82         Subjective Assessment - 09/29/17 0944    Subjective Not sure why the doctor had me come to therapy. I feel like everything in my right arm is back to normal since the stroke. Balance is still a touch off. My vision sometimes goes foggy and then clears. Sometimes things are moving "wiggly." I sit down for maybe for 5 minutes and it goes away.             [facility] PT Assessment - 09/29/17 0949      Assessment   Medical Diagnosis left MCA CVA; rt cerebellum infarct   Referring Provider Thad Ranger MD (hospitalist); Xu (neurologist)   Onset Date/Surgical Date 09/09/17   Hand Dominance Left   Prior Therapy acute      Precautions   Precautions None     Balance Screen   Has the patient fallen in the past 6 months No   Has the patient had a decrease in activity level because of a fear of falling?  No   Is the patient reluctant to leave their home because of a fear of falling?  No     Prior Function   Level of Independence Independent   Vocation Retired   Leisure yard work     Copy Status Within  [employer] for tasks assessed     Observation/Other Assessments   Focus on Therapeutic Outcomes (FOTO)  FS 80 (adjusted 62)   Activities of Balance Confidence Scale (ABC Scale)  70%     Sensation   Light Touch Appears Intact     Coordination   Gross Motor Movements are Fluid and Coordinated Yes   Fine Motor Movements are Fluid and Coordinated Not tested     Posture/Postural Control   Posture/Postural Control No significant limitations     ROM / Strength   AROM / PROM / Strength Strength     Strength   Overall Strength Within functional limits for tasks performed  bil LEs     Transfers   Transfers Sit to Stand;Stand to Sit   Sit to Stand 7: Independent   Stand to Sit 7: Independent     Ambulation/Gait   Ambulation/Gait Yes   Ambulation/Gait Assistance 6: Modified independent (Device/Increase time)   Ambulation Distance (Feet) 200 Feet   Assistive device None   Gait Pattern Step-through pattern;Decreased arm swing - right;Decreased arm swing - left;Decreased trunk rotation   Ambulation Surface Level   Gait velocity 2.63 ft/sec     Functional Gait  Assessment   Gait  assessed  Yes   Gait Level Surface Walks 20 ft, slow speed, abnormal gait pattern, evidence for imbalance or deviates 10-15 in outside of the 12 in walkway width. Requires more than 7 sec to ambulate 20 ft.  7.6sec   Change in Gait Speed Able to change speed, demonstrates mild gait deviations, deviates 6-10 in outside of the 12 in walkway width, or no gait deviations, unable to achieve a major change in velocity, or uses a change in velocity, or uses an assistive device.   Gait with Horizontal Head Turns Performs head turns smoothly with no change in gait. Deviates no more than 6 in outside 12 in walkway width   Gait with Vertical Head Turns Performs head turns with no change in gait. Deviates no more than 6 in outside 12 in walkway width.   Gait and Pivot Turn Pivot turns safely within 3 sec and stops  quickly with no loss of balance.   Step Over Obstacle Is able to step over 2 stacked shoe boxes taped together (9 in total height) without changing gait speed. No evidence of imbalance.   Gait with Narrow Base of Support Is able to ambulate for 10 steps heel to toe with no staggering.   Gait with Eyes Closed Walks 20 ft, slow speed, abnormal gait pattern, evidence for imbalance, deviates 10-15 in outside 12 in walkway width. Requires more than 9 sec to ambulate 20 ft.   Ambulating Backwards Walks 20 ft, uses assistive device, slower speed, mild gait deviations, deviates 6-10 in outside 12 in walkway width.   Steps Alternating feet, must use rail.   Total Score 23   FGA comment: 19-24 = medium risk fall; most points lost due to decr velocity; no LOB            Vestibular Assessment - 09/29/17 0001      Symptom Behavior   Type of Dizziness "World moves"   Frequency of Dizziness daily   Duration of Dizziness 5 minutes   Aggravating Factors Activity in general   Relieving Factors Closing eyes     Occulomotor Exam   Occulomotor Alignment Normal   Spontaneous Absent   Gaze-induced Absent   Smooth Pursuits Intact   Saccades Dysmetria  slight overshooting inconsistently     Vestibulo-Occular Reflex   Comment HIT negative        Objective measurements completed on examination: See above findings.                  PT Education - 09/29/17 2101    Education provided Yes   Education Details reviewed signs and symptoms of CVA (pt able to name weakness, numbness on one side; changes in speaking; falls (loss of balance); reviewed importance of monitoring BP and tobacco cessation (pt has not smoked since hospitalized)   Person(s) Educated Patient   Methods Explanation   Comprehension Verbalized understanding                     Plan - 09/29/17 2103    Clinical Impression Statement Patient presents for PT evaluation s/p Lt MCA CVA and rt cerebellum CVA with  mild deficits in RUE, gait, and balance. He reported occasional change in vision, including sense of objects "wiggling" however did not occur during evaluation (including during vestibular assessment). Patient scored in moderate fall risk category per FGA, however greatest limitation is velocity--pt reports he is self-selecting slower speed due to recent imbalance and occasional vision changes. Able to tandem walk  10 steps. No further PT indicated and pt in agreement.    History and Personal Factors relevant to plan of care: NA-no followup   Clinical Presentation Evolving   Clinical Presentation due to: <1 month s/p CVA with continued resolution of symptoms   Clinical Decision Making Low   PT Frequency One time visit   Consulted and Agree with Plan of Care Patient      Patient will benefit from skilled therapeutic intervention in order to improve the following deficits and impairments:     Visit Diagnosis: Other abnormalities of gait and mobility - Plan: PT plan of care cert/re-cert      G-Codes - 09/29/17 2110    Functional Assessment Tool Used (Outpatient Only) FGA 23/30; gait velocity 2.63 ft/sec   Functional Limitation Mobility: Walking and moving around   Mobility: Walking and Moving Around Current Status [phone]([id number]) At least 1 percent but less than 20 percent impaired, limited or restricted   Mobility: Walking and Moving Around Goal Status [phone]([id number]) At least 1 percent but less than 20 percent impaired, limited or restricted   Mobility: Walking and Moving Around Discharge Status [phone]([id number]) At least 1 percent but less than 20 percent impaired, limited or restricted       Problem List Patient Active Problem List   Diagnosis Date Noted  . Chronic systolic heart failure (HCC)   . Hypertension 09/09/2017  . Atrial flutter (HCC) 09/09/2017  . TIA (transient ischemic attack) 09/09/2017  . Acute CVA (cerebrovascular accident) (HCC) 09/09/2017  . Hyperlipidemia 09/09/2017  . Dysphagia   .  Facial droop   . Fall   . Smoker 11/26/2009  . BRONCHITIS, CHRONIC 11/26/2009  . INGUINAL HERNIA, LEFT 11/26/2009  . DUODENAL ULCER, HX OF 11/26/2009    Zena AmosLynn P Alex Mcmanigal, PT 09/29/2017, 9:14 PM  [facility] [address] [place], [place], [address] Phone: [phone]   Fax:  [phone]  Name: Angelina PihJimmy L Antonucci MRN: [id number] Date of Birth: 02/20/1946

## 2017-09-30 ENCOUNTER — Ambulatory Visit: Payer: Medicare PPO | Admitting: Physician Assistant

## 2017-10-18 ENCOUNTER — Encounter: Payer: Self-pay | Admitting: Cardiology

## 2017-10-18 ENCOUNTER — Ambulatory Visit: Payer: Medicare PPO | Admitting: Cardiology

## 2017-10-18 ENCOUNTER — Encounter: Payer: Self-pay | Admitting: *Deleted

## 2017-10-18 DIAGNOSIS — I429 Cardiomyopathy, unspecified: Secondary | ICD-10-CM

## 2017-10-18 DIAGNOSIS — Z7901 Long term (current) use of anticoagulants: Secondary | ICD-10-CM

## 2017-10-18 DIAGNOSIS — I483 Typical atrial flutter: Secondary | ICD-10-CM

## 2017-10-18 DIAGNOSIS — I10 Essential (primary) hypertension: Secondary | ICD-10-CM

## 2017-10-18 DIAGNOSIS — R931 Abnormal findings on diagnostic imaging of heart and coronary circulation: Secondary | ICD-10-CM | POA: Diagnosis not present

## 2017-10-18 DIAGNOSIS — I639 Cerebral infarction, unspecified: Secondary | ICD-10-CM | POA: Diagnosis not present

## 2017-10-18 DIAGNOSIS — E785 Hyperlipidemia, unspecified: Secondary | ICD-10-CM

## 2017-10-18 DIAGNOSIS — F17200 Nicotine dependence, unspecified, uncomplicated: Secondary | ICD-10-CM | POA: Diagnosis not present

## 2017-10-18 NOTE — Assessment & Plan Note (Signed)
Controlled.  

## 2017-10-18 NOTE — Assessment & Plan Note (Signed)
On Eliquis

## 2017-10-18 NOTE — H&P (View-Only) (Signed)
10/18/2017 Nathan Perez   01/31/1946  [id number]  Primary Physician Billee CashingMcKenzie, [place], MD Primary Cardiologist: Dr Duke Salviaandolph  HPI:  71 y/o male admitted 09/09/17 after a syncopal spell at home. Pt had syncope after mowing his lawn. In the ED he was noted to facial droop. MRI revealed multiple embolic CVA. His EKG was noted to be atrial flutter with CVR. An echo showed his EF to be 25-30%, LA upper limits of normal. A Myoview showed AS ischemia and areas of scar. He was not felt to be a candidate for cath secondary to his recent stroke. He is in the office today for follow up. He denies chest pain. He has not had any further syncopal spells. He remains in atrial flutter.    Current Outpatient Medications  Medication Sig Dispense Refill  . apixaban (ELIQUIS) 5 MG TABS tablet Take 1 tablet (5 mg total) by mouth 2 (two) times daily. 60 tablet 2  . aspirin EC 81 MG EC tablet Take 1 tablet (81 mg total) by mouth daily. 30 tablet 3  . atorvastatin (LIPITOR) 20 MG tablet Take 1 tablet (20 mg total) by mouth at bedtime. 30 tablet 4  . losartan (COZAAR) 25 MG tablet Take 1 tablet (25 mg total) by mouth daily. 30 tablet 3  . metoprolol succinate (TOPROL-XL) 25 MG 24 hr tablet Take 1 tablet (25 mg total) by mouth daily. 30 tablet 3  . UNABLE TO FIND Outpatient PTOT (physical therapies- neuro)  Diagnosis: acute stroke (Patient not taking: Reported on 10/18/2017) 1 Mutually Defined 0   No current facility-administered medications for this visit.     No Known Allergies  History reviewed. No pertinent past medical history.  Social History   Socioeconomic History  . Marital status: Married    Spouse name: Not on file  . Number of children: Not on file  . Years of education: Not on file  . Highest education level: Not on file  Social Needs  . Financial resource strain: Not on file  . Food insecurity - worry: Not on file  . Food insecurity - inability: Not on file  . Transportation needs -  medical: Not on file  . Transportation needs - non-medical: Not on file  Occupational History  . Not on file  Tobacco Use  . Smoking status: Current Every Day Smoker    Packs/day: 0.50    Types: Cigarettes  . Smokeless tobacco: Never Used  Substance and Sexual Activity  . Alcohol use: Yes    Comment: occasionally   . Drug use: No  . Sexual activity: Not on file  Other Topics Concern  . Not on file  Social History Narrative  . Not on file     Family History  Problem Relation Age of Onset  . Dementia Mother      Review of Systems: General: negative for chills, fever, night sweats or weight changes.  Cardiovascular: negative for chest pain, dyspnea on exertion, edema, orthopnea, palpitations, paroxysmal nocturnal dyspnea or shortness of breath Dermatological: negative for rash Respiratory: negative for cough or wheezing Urologic: negative for hematuria Abdominal: negative for nausea, vomiting, diarrhea, bright red blood per rectum, melena, or hematemesis Neurologic: negative for visual changes, syncope, or dizziness All other systems reviewed and are otherwise negative except as noted above.    Blood pressure 124/72, pulse 99, height 6\' 1"  (1.854 m), weight 150 lb 12.8 oz (68.4 kg).  General appearance: alert, cooperative and no distress, poor dentition Neck: no carotid bruit  and no JVD Lungs: clear to auscultation bilaterally Heart: regularly irregular rhythm Extremities: extremities normal, atraumatic, no cyanosis or edema Skin: Skin color, texture, turgor normal. 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   After careful review of history and examination, the risks and benefits of transesophageal echocardiogram have been explained including risks of esophageal damage, perforation (1:10,000 risk), bleeding, pharyngeal hematoma as well as other potential complications associated with conscious sedation including aspiration, arrhythmia, respiratory failure and death. Alternatives to treatment were discussed, questions were answered. Patient is willing to proceed.   Consider diagnostic cath at a later date.   Corine Shelter, [place]  10/18/2017 12:28 PM  Corine Shelter PA-C 10/18/2017 10:07 AM

## 2017-10-18 NOTE — Assessment & Plan Note (Signed)
Pt found to be in atrial flutter when admitted with embolic CVA 09/09/17

## 2017-10-18 NOTE — Assessment & Plan Note (Signed)
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

## 2017-10-18 NOTE — Assessment & Plan Note (Signed)
1/2 PPD

## 2017-10-18 NOTE — Progress Notes (Signed)
10/18/2017 Nathan Perez   01/31/1946  [id number]  Primary Physician Billee CashingMcKenzie, [place], MD Primary Cardiologist: Dr Duke Salviaandolph  HPI:  71 y/o male admitted 09/09/17 after a syncopal spell at home. Pt had syncope after mowing his lawn. In the ED he was noted to facial droop. MRI revealed multiple embolic CVA. His EKG was noted to be atrial flutter with CVR. An echo showed his EF to be 25-30%, LA upper limits of normal. A Myoview showed AS ischemia and areas of scar. He was not felt to be a candidate for cath secondary to his recent stroke. He is in the office today for follow up. He denies chest pain. He has not had any further syncopal spells. He remains in atrial flutter.    Current Outpatient Medications  Medication Sig Dispense Refill  . apixaban (ELIQUIS) 5 MG TABS tablet Take 1 tablet (5 mg total) by mouth 2 (two) times daily. 60 tablet 2  . aspirin EC 81 MG EC tablet Take 1 tablet (81 mg total) by mouth daily. 30 tablet 3  . atorvastatin (LIPITOR) 20 MG tablet Take 1 tablet (20 mg total) by mouth at bedtime. 30 tablet 4  . losartan (COZAAR) 25 MG tablet Take 1 tablet (25 mg total) by mouth daily. 30 tablet 3  . metoprolol succinate (TOPROL-XL) 25 MG 24 hr tablet Take 1 tablet (25 mg total) by mouth daily. 30 tablet 3  . UNABLE TO FIND Outpatient PTOT (physical therapies- neuro)  Diagnosis: acute stroke (Patient not taking: Reported on 10/18/2017) 1 Mutually Defined 0   No current facility-administered medications for this visit.     No Known Allergies  History reviewed. No pertinent past medical history.  Social History   Socioeconomic History  . Marital status: Married    Spouse name: Not on file  . Number of children: Not on file  . Years of education: Not on file  . Highest education level: Not on file  Social Needs  . Financial resource strain: Not on file  . Food insecurity - worry: Not on file  . Food insecurity - inability: Not on file  . Transportation needs -  medical: Not on file  . Transportation needs - non-medical: Not on file  Occupational History  . Not on file  Tobacco Use  . Smoking status: Current Every Day Smoker    Packs/day: 0.50    Types: Cigarettes  . Smokeless tobacco: Never Used  Substance and Sexual Activity  . Alcohol use: Yes    Comment: occasionally   . Drug use: No  . Sexual activity: Not on file  Other Topics Concern  . Not on file  Social History Narrative  . Not on file     Family History  Problem Relation Age of Onset  . Dementia Mother      Review of Systems: General: negative for chills, fever, night sweats or weight changes.  Cardiovascular: negative for chest pain, dyspnea on exertion, edema, orthopnea, palpitations, paroxysmal nocturnal dyspnea or shortness of breath Dermatological: negative for rash Respiratory: negative for cough or wheezing Urologic: negative for hematuria Abdominal: negative for nausea, vomiting, diarrhea, bright red blood per rectum, melena, or hematemesis Neurologic: negative for visual changes, syncope, or dizziness All other systems reviewed and are otherwise negative except as noted above.    Blood pressure 124/72, pulse 99, height 6\' 1"  (1.854 m), weight 150 lb 12.8 oz (68.4 kg).  General appearance: alert, cooperative and no distress, poor dentition Neck: no carotid bruit  and no JVD Lungs: clear to auscultation bilaterally Heart: regularly irregular rhythm Extremities: extremities normal, atraumatic, no cyanosis or edema Skin: Skin color, texture, turgor normal. 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   After careful review of history and examination, the risks and benefits of transesophageal echocardiogram have been explained including risks of esophageal damage, perforation (1:10,000 risk), bleeding, pharyngeal hematoma as well as other potential complications associated with conscious sedation including aspiration, arrhythmia, respiratory failure and death. Alternatives to treatment were discussed, questions were answered. Patient is willing to proceed.   Consider diagnostic cath at a later date.   Nathan Perez, [place]  10/18/2017 12:28 PM  Nathan Shelter PA-C 10/18/2017 10:07 AM

## 2017-10-18 NOTE — Assessment & Plan Note (Signed)
LDL 88- on statin Rx

## 2017-10-18 NOTE — Patient Instructions (Signed)
Medication Instructions:  Your physician recommends that you continue on your current medications as directed. Please refer to the Current Medication list given to you today.   Labwork: TODAY:  BMET, CBC, & PT/INR  Testing/Procedures: Your physician has requested that you have a TEE/Cardioversion. During a TEE, sound waves are used to create images of your heart. It provides your doctor with information about the size and shape of your heart and how well your heart's chambers and valves are working. In this test, a transducer is attached to the end of a flexible tube that is guided down you throat and into your esophagus (the tube leading from your mouth to your stomach) to get a more detailed image of your heart. Once the TEE has determined that a blood clot is not present, the cardioversion begins. Electrical Cardioversion uses a jolt of electricity to your heart either through paddles or wired patches attached to your chest. This is a controlled, usually prescheduled, procedure. This procedure is done at the hospital and you are not awake during the procedure. You usually go home the day of the procedure. Please see the instruction sheet given to you today for more information.   Follow-Up: Your physician recommends that you schedule a follow-up appointment in: 4 WEEKS WITH DR. [place]   Any Other Special Instructions Will Be Listed Below (If Applicable).  Transesophageal Echocardiogram Transesophageal echocardiography (TEE) is a picture test of your heart using sound waves. The pictures taken can give very detailed pictures of your heart. This can help your doctor see if there are problems with your heart. TEE can check:  If your heart has blood clots in it.  How well your heart valves are working.  If you have an infection on the inside of your heart.  Some of the major arteries of your heart.  If your heart valve is working after a Psychologist, forensic.  Your heart before a procedure that uses  a shock to your heart to get the rhythm back to normal.  What happens before the procedure?  Do not eat or drink for 6 hours before the procedure or as told by your doctor.  Make plans to have someone drive you home after the procedure. Do not drive yourself home.  An IV tube will be put in your arm. What happens during the procedure?  You will be given a medicine to help you relax (sedative). It will be given through the IV tube.  A numbing medicine will be sprayed or gargled in the back of your throat to help numb it.  The tip of the probe is placed into the back of your mouth. You will be asked to swallow. This helps to pass the probe into your esophagus.  Once the tip of the probe is in the right place, your doctor can take pictures of your heart.  You may feel pressure at the back of your throat. What happens after the procedure?  You will be taken to a recovery area so the sedative can wear off.  Your throat may be sore and scratchy. This will go away slowly over time.  You will go home when you are fully awake and able to swallow liquids.  You should have someone stay with you for the next 24 hours.  Do not drive or operate machinery for the next 24 hours. This information is not intended to replace advice given to you by your health care provider. Make sure you discuss any questions you have with  your health care provider. Document Released: 09/27/2009 Document Revised: 05/07/2016 Document Reviewed: 06/01/2013 Elsevier Interactive Patient Education  2018 [employer]    Electrical Cardioversion, Care After This sheet gives you information about how to care for yourself after your procedure. Your health care provider may also give you more specific instructions. If you have problems or questions, contact your health care provider. What can I expect after the procedure? After the procedure, it is common to have:  Some redness on the skin where the shocks were  given.  Follow these instructions at home:  Do not drive for 24 hours if you were given a medicine to help you relax (sedative).  Take over-the-counter and prescription medicines only as told by your health care provider.  Ask your health care provider how to check your pulse. Check it often.  Rest for 48 hours after the procedure or as told by your health care provider.  Avoid or limit your caffeine use as told by your health care provider. Contact a health care provider if:  You feel like your heart is beating too quickly or your pulse is not regular.  You have a serious muscle cramp that does not go away. Get help right away if:  You have discomfort in your chest.  You are dizzy or you feel faint.  You have trouble breathing or you are short of breath.  Your speech is slurred.  You have trouble moving an arm or leg on one side of your body.  Your fingers or toes turn cold or blue. This information is not intended to replace advice given to you by your health care provider. Make sure you discuss any questions you have with your health care provider. Document Released: 09/20/2013 Document Revised: 07/03/2016 Document Reviewed: 06/05/2016 Elsevier Interactive Patient Education  [employer]2018 [employer]vier Inc.   If you need a refill on your cardiac medications before your next appointment, please call your pharmacy.

## 2017-10-18 NOTE — Assessment & Plan Note (Signed)
Not a good candidate for coronary angiogram secondary to recent stroke- Consider cath at a later date

## 2017-10-18 NOTE — Assessment & Plan Note (Signed)
EF 25-30%- Etiology not yet determined but Myoview was abnormal suggesting ischemia

## 2017-10-19 ENCOUNTER — Ambulatory Visit (HOSPITAL_COMMUNITY)
Admission: RE | Admit: 2017-10-19 | Discharge: 2017-10-19 | Disposition: A | Discharge status: Home / Self Care | Payer: Medicare PPO | Source: Ambulatory Visit | Attending: Cardiovascular Disease | Admitting: Cardiovascular Disease

## 2017-10-19 ENCOUNTER — Other Ambulatory Visit: Payer: Self-pay

## 2017-10-19 ENCOUNTER — Ambulatory Visit (HOSPITAL_BASED_OUTPATIENT_CLINIC_OR_DEPARTMENT_OTHER)
Admission: RE | Admit: 2017-10-19 | Discharge: 2017-10-19 | Disposition: A | Discharge status: Home / Self Care | Payer: Medicare PPO | Source: Ambulatory Visit | Attending: Cardiovascular Disease | Admitting: Cardiovascular Disease

## 2017-10-19 ENCOUNTER — Encounter (HOSPITAL_COMMUNITY): Payer: Self-pay | Admitting: *Deleted

## 2017-10-19 ENCOUNTER — Encounter (HOSPITAL_COMMUNITY)
Admission: RE | Disposition: A | Discharge status: Home / Self Care | Payer: Self-pay | Source: Ambulatory Visit | Attending: Cardiovascular Disease

## 2017-10-19 ENCOUNTER — Ambulatory Visit (HOSPITAL_COMMUNITY): Payer: Medicare PPO | Admitting: Certified Registered Nurse Anesthetist

## 2017-10-19 DIAGNOSIS — Z79899 Other long term (current) drug therapy: Secondary | ICD-10-CM | POA: Insufficient documentation

## 2017-10-19 DIAGNOSIS — Z7901 Long term (current) use of anticoagulants: Secondary | ICD-10-CM | POA: Insufficient documentation

## 2017-10-19 DIAGNOSIS — Z7982 Long term (current) use of aspirin: Secondary | ICD-10-CM | POA: Insufficient documentation

## 2017-10-19 DIAGNOSIS — I4892 Unspecified atrial flutter: Secondary | ICD-10-CM | POA: Insufficient documentation

## 2017-10-19 DIAGNOSIS — E785 Hyperlipidemia, unspecified: Secondary | ICD-10-CM | POA: Diagnosis not present

## 2017-10-19 DIAGNOSIS — F17210 Nicotine dependence, cigarettes, uncomplicated: Secondary | ICD-10-CM | POA: Diagnosis not present

## 2017-10-19 DIAGNOSIS — Z8673 Personal history of transient ischemic attack (TIA), and cerebral infarction without residual deficits: Secondary | ICD-10-CM | POA: Insufficient documentation

## 2017-10-19 DIAGNOSIS — I429 Cardiomyopathy, unspecified: Secondary | ICD-10-CM | POA: Diagnosis not present

## 2017-10-19 DIAGNOSIS — I10 Essential (primary) hypertension: Secondary | ICD-10-CM | POA: Diagnosis not present

## 2017-10-19 DIAGNOSIS — I483 Typical atrial flutter: Secondary | ICD-10-CM

## 2017-10-19 HISTORY — PX: TEE WITHOUT CARDIOVERSION: SHX5443

## 2017-10-19 HISTORY — PX: CARDIOVERSION: SHX1299

## 2017-10-19 LAB — BASIC METABOLIC PANEL
BUN/Creatinine Ratio: 9 — ABNORMAL LOW (ref 10–24)
BUN: 11 mg/dL (ref 8–27)
CO2: 25 mmol/L (ref 20–29)
Calcium: 10.3 mg/dL — ABNORMAL HIGH (ref 8.6–10.2)
Chloride: 103 mmol/L (ref 96–106)
Creatinine, Ser: 1.21 mg/dL (ref 0.76–1.27)
GFR calc Af Amer: 69 mL/min/{1.73_m2} (ref >59)
GFR calc non Af Amer: 59 mL/min/{1.73_m2} — ABNORMAL LOW (ref >59)
Glucose: 97 mg/dL (ref 65–99)
Potassium: 5.5 mmol/L — ABNORMAL HIGH (ref 3.5–5.2)
Sodium: 145 mmol/L — ABNORMAL HIGH (ref 134–144)

## 2017-10-19 LAB — POCT I-STAT, CHEM 8
BUN: 15 mg/dL (ref 6–20)
Calcium, Ion: 1.18 mmol/L (ref 1.15–1.40)
Chloride: 104 mmol/L (ref 101–111)
Creatinine, Ser: 1.2 mg/dL (ref 0.61–1.24)
Glucose, Bld: 82 mg/dL (ref 65–99)
HEMATOCRIT: 44 % (ref 39.0–52.0)
HEMOGLOBIN: 15 g/dL (ref 13.0–17.0)
Potassium: 4.4 mmol/L (ref 3.5–5.1)
SODIUM: 141 mmol/L (ref 135–145)
TCO2: 28 mmol/L (ref 22–32)

## 2017-10-19 LAB — PROTIME-INR
INR: 1.1 (ref 0.8–1.2)
Prothrombin Time: 10.9 s (ref 9.1–12.0)

## 2017-10-19 LAB — CBC
Hematocrit: 43.7 % (ref 37.5–51.0)
Hemoglobin: 14.6 g/dL (ref 13.0–17.7)
MCH: 33.2 pg — ABNORMAL HIGH (ref 26.6–33.0)
MCHC: 33.4 g/dL (ref 31.5–35.7)
MCV: 99 fL — ABNORMAL HIGH (ref 79–97)
Platelets: 276 10*3/uL (ref 150–379)
RBC: 4.4 x10E6/uL (ref 4.14–5.80)
RDW: 13.9 % (ref 12.3–15.4)
WBC: 5.3 10*3/uL (ref 3.4–10.8)

## 2017-10-19 SURGERY — CARDIOVERSION
Anesthesia: General

## 2017-10-19 MED ORDER — PROPOFOL 10 MG/ML IV BOLUS
INTRAVENOUS | Status: DC | PRN
  Administered 2017-10-19: 20 mg via INTRAVENOUS
  Administered 2017-10-19: 10 mg via INTRAVENOUS
  Administered 2017-10-19: 20 mg via INTRAVENOUS
  Administered 2017-10-19 (×4): 15 mg via INTRAVENOUS

## 2017-10-19 MED ORDER — PROPOFOL 500 MG/50ML IV EMUL
INTRAVENOUS | Status: DC | PRN
  Administered 2017-10-19: 100 ug/kg/min via INTRAVENOUS

## 2017-10-19 MED ORDER — BUTAMBEN-TETRACAINE-BENZOCAINE 2-2-14 % EX AERO
INHALATION_SPRAY | CUTANEOUS | Status: DC | PRN
  Administered 2017-10-19: 1 via TOPICAL

## 2017-10-19 MED ORDER — LIDOCAINE HCL (CARDIAC) 20 MG/ML IV SOLN
INTRAVENOUS | Status: DC | PRN
  Administered 2017-10-19: 60 mg via INTRATRACHEAL

## 2017-10-19 MED ORDER — SODIUM CHLORIDE 0.9 % IV SOLN
INTRAVENOUS | Status: DC | PRN
  Administered 2017-10-19: 13:00:00 via INTRAVENOUS

## 2017-10-19 MED ORDER — ONDANSETRON HCL 4 MG/2ML IJ SOLN
INTRAMUSCULAR | Status: DC | PRN
  Administered 2017-10-19: 4 mg via INTRAVENOUS

## 2017-10-19 NOTE — Anesthesia Procedure Notes (Signed)
Procedure Name: MAC Date/Time: 10/19/2017 12:48 PM Performed by: White, Amedeo Plenty, CRNA Pre-anesthesia Checklist: Patient identified, Emergency Drugs available, Suction available and Patient being monitored Patient Re-evaluated:Patient Re-evaluated prior to induction Oxygen Delivery Method: Nasal cannula

## 2017-10-19 NOTE — Anesthesia Preprocedure Evaluation (Signed)
Anesthesia Evaluation  Patient identified by MRN, date of birth, ID band  History of Anesthesia Complications Negative for: history of anesthetic complications  Airway Mallampati: II  TM Distance: >3 FB Neck ROM: Full    Dental no notable dental hx.    Pulmonary Current Smoker,    breath sounds clear to auscultation       Cardiovascular hypertension, + dysrhythmias  Rhythm:Irregular Rate:Normal     Neuro/Psych    GI/Hepatic negative GI ROS, Neg liver ROS,   Endo/Other  negative endocrine ROS  Renal/GU negative Renal ROS     Musculoskeletal   Abdominal   Peds  Hematology negative hematology ROS (+)   Anesthesia Other Findings   Reproductive/Obstetrics                             Anesthesia Physical Anesthesia Plan  ASA: II  Anesthesia Plan: General   Post-op Pain Management:    Induction: Intravenous  PONV Risk Score and Plan: 1 and Treatment may vary due to age or medical condition  Airway Management Planned: Mask  Additional Equipment:   Intra-op Plan:   Post-operative Plan:   Informed Consent: I have reviewed the patients History and Physical, chart, labs and discussed the procedure including the risks, benefits and alternatives for the proposed anesthesia with the patient or authorized representative who has indicated his/her understanding and acceptance.     Plan Discussed with: CRNA  Anesthesia Plan Comments:         Anesthesia Quick Evaluation

## 2017-10-19 NOTE — Discharge Instructions (Signed)
TEE ° °YOU HAD AN CARDIAC PROCEDURE TODAY: Refer to the procedure report and other information in the discharge instructions given to you for any specific questions about what was found during the examination. If this information does not answer your questions, please call [facility] office at [phone] to clarify.  ° °DIET: Your first meal following the procedure should be a light meal and then it is ok to progress to your normal diet. A half-sandwich or bowl of soup is an example of a good first meal. Heavy or fried foods are harder to digest and may make you feel nauseous or bloated. Drink plenty of fluids but you should avoid alcoholic beverages for 24 hours. If you had a esophageal dilation, please see attached instructions for diet.  ° °ACTIVITY: Your care partner should take you home directly after the procedure. You should plan to take it easy, moving slowly for the rest of the day. You can resume normal activity the day after the procedure however YOU SHOULD NOT DRIVE, use power tools, machinery or perform tasks that involve climbing or major physical exertion for 24 hours (because of the sedation medicines used during the test).  ° °SYMPTOMS TO REPORT IMMEDIATELY: °A cardiologist can be reached at any hour. Please call [phone] for any of the following symptoms:  °Vomiting of blood or coffee ground material  °New, significant abdominal pain  °New, significant chest pain or pain under the shoulder blades  °Painful or persistently difficult swallowing  °New shortness of breath  °Black, tarry-looking or red, bloody stools ° °FOLLOW UP:  °Please also call with any specific questions about appointments or follow up tests. ° °Electrical Cardioversion, Care After °This sheet gives you information about how to care for yourself after your procedure. Your health care provider may also give you more specific instructions. If you have problems or questions, contact your health care provider. °What can I  expect after the procedure? °After the procedure, it is common to have: °· Some redness on the skin where the shocks were given. ° °Follow these instructions at home: °· Do not drive for 24 hours if you were given a medicine to help you relax (sedative). °· Take over-the-counter and prescription medicines only as told by your health care provider. °· Ask your health care provider how to check your pulse. Check it often. °· Rest for 48 hours after the procedure or as told by your health care provider. °· Avoid or limit your caffeine use as told by your health care provider. °Contact a health care provider if: °· You feel like your heart is beating too quickly or your pulse is not regular. °· You have a serious muscle cramp that does not go away. °Get help right away if: °· You have discomfort in your chest. °· You are dizzy or you feel faint. °· You have trouble breathing or you are short of breath. °· Your speech is slurred. °· You have trouble moving an arm or leg on one side of your body. °· Your fingers or toes turn cold or blue. °This information is not intended to replace advice given to you by your health care provider. Make sure you discuss any questions you have with your health care provider. °Document Released: 09/20/2013 Document Revised: 07/03/2016 Document Reviewed: 06/05/2016 °Elsevier Interactive Patient Education © 2018 [employer] ° °

## 2017-10-19 NOTE — Anesthesia Postprocedure Evaluation (Signed)
Anesthesia Post Note  Patient: Nathan Perez  Procedure(s) Performed: CARDIOVERSION (N/A ) TRANSESOPHAGEAL ECHOCARDIOGRAM (TEE) (N/A )     Patient location during evaluation: PACU Anesthesia Type: General Level of consciousness: awake and sedated Pain management: pain level controlled Vital Signs Assessment: post-procedure vital signs reviewed and stable Respiratory status: spontaneous breathing, nonlabored ventilation, respiratory function stable and patient connected to nasal cannula oxygen Cardiovascular status: blood pressure returned to baseline and stable Postop Assessment: no apparent nausea or vomiting Anesthetic complications: no    Last Vitals:  Vitals:   10/19/17 1320 10/19/17 1330  BP: 106/63 126/69  Pulse: (!) 59 62  Resp: 20 20  Temp:    SpO2: 97% 98%    Last Pain:  Vitals:   10/19/17 1309  TempSrc: Oral                 Ameli Sangiovanni,JAMES TERRILL

## 2017-10-19 NOTE — Interval H&P Note (Signed)
History and Physical Interval Note:  10/19/2017 12:39 PM  Nathan PihJimmy L Delarosa  has presented today for surgery, with the diagnosis of AFLUTTER  The various methods of treatment have been discussed with the patient and family. After consideration of risks, benefits and other options for treatment, the patient has consented to  Procedure(s): CARDIOVERSION (N/A) TRANSESOPHAGEAL ECHOCARDIOGRAM (TEE) (N/A) as a surgical intervention .  The patient's history has been reviewed, patient examined, no change in status, stable for surgery.  I have reviewed the patient's chart and labs.  Questions were answered to the patient's satisfaction.     Hedy Garro

## 2017-10-19 NOTE — Progress Notes (Signed)
Echocardiogram Echocardiogram Transesophageal has been performed.  Pieter PartridgeBrooke S Peighton Edgin 10/19/2017, 1:27 PM

## 2017-10-19 NOTE — Anesthesia Postprocedure Evaluation (Signed)
Anesthesia Post Note  Patient: Nathan Perez  Procedure(s) Performed: CARDIOVERSION (N/A ) TRANSESOPHAGEAL ECHOCARDIOGRAM (TEE) (N/A )     Patient location during evaluation: Endoscopy Anesthesia Type: General Level of consciousness: awake and alert Pain management: pain level controlled Vital Signs Assessment: post-procedure vital signs reviewed and stable Respiratory status: spontaneous breathing, nonlabored ventilation, respiratory function stable and patient connected to nasal cannula oxygen Cardiovascular status: blood pressure returned to baseline and stable Postop Assessment: no apparent nausea or vomiting Anesthetic complications: no    Last Vitals:  Vitals:   10/19/17 1320 10/19/17 1330  BP: 106/63 126/69  Pulse: (!) 59 62  Resp: 20 20  Temp:    SpO2: 97% 98%    Last Pain:  Vitals:   10/19/17 1309  TempSrc: Oral                 Danyelle Brookover,JAMES TERRILL

## 2017-10-19 NOTE — Transfer of Care (Signed)
Immediate Anesthesia Transfer of Care Note  Patient: Nathan Perez  Procedure(s) Performed: CARDIOVERSION (N/A ) TRANSESOPHAGEAL ECHOCARDIOGRAM (TEE) (N/A )  Patient Location: Endoscopy Unit  Anesthesia Type:General  Level of Consciousness: drowsy and responds to stimulation  Airway & Oxygen Therapy: Patient Spontanous Breathing and Patient connected to nasal cannula oxygen  Post-op Assessment: Report given to RN and Post -op Vital signs reviewed and stable  Post vital signs: Reviewed and stable  Last Vitals:  Vitals:   10/19/17 1233  BP: (!) 168/90  Resp: 18  Temp: (!) 36.1 C  SpO2: 97%    Last Pain:  Vitals:   10/19/17 1233  TempSrc: Oral         Complications: No apparent anesthesia complications

## 2017-10-19 NOTE — Op Note (Signed)
Procedure: Electrical Cardioversion Indications:  Atrial Flutter  Procedure Details:  Consent: Risks of procedure as well as the alternatives and risks of each were explained to the (patient/caregiver).  Consent for procedure obtained.  Time Out: Verified patient identification, verified procedure, site/side was marked, verified correct patient position, special equipment/implants available, medications/allergies/relevent history reviewed, required imaging and test results available.  Performed  Patient placed on cardiac monitor, pulse oximetry, supplemental oxygen as necessary.  Sedation given: IV propofol Pacer pads placed anterior and posterior chest.  Cardioverted 1 time(s).  Cardioversion with synchronized biphasic 120J shock.  Evaluation: Findings: Post procedure EKG shows: sinus bradycardia Complications: None Patient did tolerate procedure well.  Time Spent Directly with the Patient:  30 minutes   Nathan Perez 10/19/2017, 1:07 PM

## 2017-10-19 NOTE — Op Note (Signed)
INDICATIONS: atrial flutter pre-cardioversion  PROCEDURE:   Informed consent was obtained prior to the procedure. The risks, benefits and alternatives for the procedure were discussed and the patient comprehended these risks.  Risks include, but are not limited to, cough, sore throat, vomiting, nausea, somnolence, esophageal and stomach trauma or perforation, bleeding, low blood pressure, aspiration, pneumonia, infection, trauma to the teeth and death.    After a procedural time-out, the oropharynx was anesthetized with 20% benzocaine spray.   Anesthesiology (Dr. Jacklynn BueMassagee) administered IV propofol for sedation..  The transesophageal probe was inserted in the esophagus and stomach without difficulty and multiple views were obtained.  The patient was kept under observation until the patient left the procedure room.  The patient left the procedure room in stable condition.   Agitated microbubble saline contrast was not administered.  COMPLICATIONS:    There were no immediate complications.  FINDINGS:  Severely depressed LVEF 15% (global pattern). Dilated LA without thrombus. Excellent LA appendage emptying velocities. No valvular abnormalities. Mild diffuse atherosclerosis in all segments of the aorta.  RECOMMENDATIONS:     Proceed with DCCV.  Time Spent Directly with the Patient:  30 minutes   Nathan Perez 10/19/2017, 1:04 PM

## 2017-11-08 ENCOUNTER — Encounter: Payer: Self-pay | Admitting: Neurology

## 2017-11-08 ENCOUNTER — Ambulatory Visit: Payer: Medicare PPO | Admitting: Neurology

## 2017-11-08 VITALS — BP 140/72 | HR 80 | Ht 73.0 in | Wt 149.8 lb

## 2017-11-08 DIAGNOSIS — I4892 Unspecified atrial flutter: Secondary | ICD-10-CM | POA: Diagnosis not present

## 2017-11-08 DIAGNOSIS — Z9889 Other specified postprocedural states: Secondary | ICD-10-CM | POA: Diagnosis not present

## 2017-11-08 DIAGNOSIS — I639 Cerebral infarction, unspecified: Secondary | ICD-10-CM | POA: Diagnosis not present

## 2017-11-08 DIAGNOSIS — F17200 Nicotine dependence, unspecified, uncomplicated: Secondary | ICD-10-CM | POA: Diagnosis not present

## 2017-11-08 DIAGNOSIS — Z9289 Personal history of other medical treatment: Secondary | ICD-10-CM | POA: Insufficient documentation

## 2017-11-08 DIAGNOSIS — I5022 Chronic systolic (congestive) heart failure: Secondary | ICD-10-CM | POA: Diagnosis not present

## 2017-11-08 NOTE — Progress Notes (Signed)
STROKE NEUROLOGY FOLLOW UP NOTE  NAME: Nathan Perez DOB: 04/26/1946  REASON FOR VISIT: stroke follow up HISTORY FROM: pt and chart  Today we had the pleasure of seeing Nathan Perez in follow-up at our Neurology Clinic. Pt was accompanied by no one.   History Summary Nathan Perez is a 71 y.o. male with history of tobaccoabuse and no known other vascular risk factors admitted on 09/09/17 after 2 falls. MRI showed 4 punctate L MCA territory acute infarcts including the posterolateral left motor strip, and right cerebellum. MRA head and neck, DVT negative. However, EF 25-30% on TTE. Had cardiology consult and found pt had EKG in ER showed aflutter. LDL 88 and A1C 5.1. Nathan Perez was put on eliquis and lipitor. Nathan Perez was discharged in good condition with plan for cardioversion and TEE 4 weeks after discharge.  Interval History During the interval time, the patient has been doing well. Still has mild right facial droop but stated that his slurry speech much improved. Nathan Perez followed with cardiology and had cardioversion on 10/19/17. However, TEE at the same time showed EF 15-20%. Nathan Perez is on cozaar and metoprolol. Nathan Perez complains of orthopnea at night. Nathan Perez has appointment with cardiology on 11/17/17. BP today in clinic 140/72.   REVIEW OF SYSTEMS: Full 14 system review of systems performed and notable only for those listed below and in HPI above, all others are negative:  Constitutional:   Cardiovascular:  Ear/Nose/Throat:   Skin:  Eyes:   Respiratory:   Gastroitestinal:   Genitourinary:  Hematology/Lymphatic:   Endocrine:  Musculoskeletal:   Allergy/Immunology:   Neurological:   Psychiatric:  Sleep:   The following represents the patient's updated allergies and side effects list: No Known Allergies  The neurologically relevant items on the patient's problem list were reviewed on today's visit.  Neurologic Examination  A problem focused neurological exam (12 or more points of the single system  neurologic examination, vital signs counts as 1 point, cranial nerves count for 8 points) was performed.  Blood pressure 140/72, pulse 80, height 6\' 1"  (1.854 m), weight 149 lb 12.8 oz (67.9 kg).  General - thin built, well developed, in no apparent distress.  Ophthalmologic - Sharp disc margins OU. .  Cardiovascular - Regular rate and rhythm with no murmur.  Mental Status -  Level of arousal and orientation to time, place, and person were intact. Language including expression, naming, repetition, comprehension was assessed and found intact.  Cranial Nerves II - XII - II - Visual field intact OU. III, IV, VI - Extraocular movements intact. V - Facial sensation intact bilaterally. VII - right nasolabial fold flattening. VIII - Hearing & vestibular intact bilaterally. X - Palate elevates symmetrically. XI - Chin turning & shoulder shrug intact bilaterally. XII - Tongue protrusion intact.  Motor Strength - The patient's strength was normal in all extremities and pronator drift was absent.  Bulk was normal and fasciculations were absent.   Motor Tone - Muscle tone was assessed at the neck and appendages and was normal.  Reflexes - The patient's reflexes were 1+ in all extremities and Nathan Perez had no pathological reflexes.  Sensory - Light touch, temperature/pinprick were assessed and were normal.    Coordination - The patient had normal movements in the hands and feet with no ataxia or dysmetria.  Tremor was absent.  Gait and Station - The patient's transfers, posture, gait, station, and turns were observed as normal.   Functional score  mRS = 2  0 - No symptoms.   1 - No significant disability. Able to carry out all usual activities, despite some symptoms.   2 - Slight disability. Able to look after own affairs without assistance, but unable to carry out all previous activities.   3 - Moderate disability. Requires some help, but able to walk unassisted.   4 - Moderately  severe disability. Unable to attend to own bodily needs without assistance, and unable to walk unassisted.   5 - Severe disability. Requires constant nursing care and attention, bedridden, incontinent.   6 - Dead.   NIH Stroke Scale = 0   Data reviewed: I personally reviewed the images and agree with the radiology interpretations.  Ct Head Wo Contrast 09/08/2017 IMPRESSION: 1. No acute intracranial abnormality identified. If symptoms persist or if clinically indicated MRI is more sensitive for stroke. 2. Mild chronic microvascular ischemic changes and parenchymal volume loss of the brain for age.   MRI Brain Wo Contrast MRA head and neck  09/09/2017 IMPRESSION: 1. Positive for several small acute infarcts in the posterior left MCA territory, including involvement of the posterolateral left motor strip. 2. Questionable punctate acute or subacute lacunar infarct superimposed in the right cerebellum, but this may be artifact. 3. No associated hemorrhage or mass effect. 4. Underlying moderately advanced for age but nonspecific bilateral cerebral white matter T2 and FLAIR hyperintensity. 5. Normal noncontrast MRI appearance of the deep gray matter nuclei and brainstem. 6. Negative for age Neck MRA. 7. Negative for age intracranial MRA. Visible left MCA branches are normal. 8. Incidental normal vascular anatomic variants including dominant right vertebral artery which supplies the basilar, non dominant the left arises near the aortic arch and terminates in the left PICA, and fetal type bilateral PCA origins.  TTE 09/09/2017 - Left ventricle: The cavity size was normal. Wall thickness was normal. Systolic function was severely reduced. The estimated ejection fraction was in the range of 25% to 30%. Severe diffuse hypokinesis with no identifiable regional variations. No evidence of thrombus. - Right ventricle: The cavity size was mildly dilated. Systolic function was moderately  reduced. - Right atrium: The atrium was mildly dilated.  LE venous doppler - no DVT   TEE 10/19/17 - Left ventricle: The cavity size was normal. Wall thickness was   normal. Systolic function was severely reduced. The estimated   ejection fraction was in the range of 15% to 20%. Severe diffuse   hypokinesis with no identifiable regional variations. No evidence   of thrombus. No evidence of thrombus. - Left atrium: No evidence of thrombus in the atrial cavity or   appendage. No evidence of thrombus in the atrial cavity or   appendage. No evidence of thrombus in the appendage. - Right ventricle: Systolic function was moderately to severely   reduced. - Right atrium: The atrium was dilated. No evidence of thrombus in   the atrial cavity or appendage. - Pulmonary arteries: Systolic pressure was mildly increased. PA   peak pressure: 34 mm Hg (S). Impressions: - Successful cardioversion. No cardiac source of emboli was   indentified.  Component     Latest Ref Rng & Units 09/09/2017  Cholesterol     0 - 200 mg/dL 161164  Triglycerides     <150 mg/dL 60  HDL Cholesterol     >40 mg/dL 64  Total CHOL/HDL Ratio     RATIO 2.6  VLDL     0 - 40 mg/dL 12  LDL (calc)     0 -  99 mg/dL 88  Hemoglobin Z6X     4.8 - 5.6 % 5.1  Mean Plasma Glucose     mg/dL 09.60  TSH     4.540 - 4.500 uIU/mL 3.672    Assessment: As you may recall, Nathan Perez is a 71 y.o. African American male with PMH of smoker admitted on  09/09/17 after 2 falls. MRI showed 4 punctate L MCA territory acute infarcts including the posterolateral left motor strip, and right cerebellum. MRA head and neck, DVT negative. However, EF 25-30% on TTE. Had cardiology consult and found pt had EKG in ER showed aflutter. LDL 88 and A1C 5.1. Nathan Perez was put on eliquis and lipitor. Nathan Perez followed with cardiology and had cardioversion on 10/19/17. However, TEE at the same time showed EF 15-20%. Nathan Perez complains of orthopnea at night. Nathan Perez has appointment with  cardiology on 11/17/17.  Plan:  - continue eliquis and ASA and lipitor for stroke prevention - continue to follow up with cardiology for CHF and for aflutter s/p cardioversion - check BP at home and record - Follow up with your primary care physician for stroke risk factor modification. Recommend maintain blood pressure goal <130/80, diabetes with hemoglobin A1c goal below 7.0% and lipids with LDL cholesterol goal below 70 mg/dL.  - heart healthy diet and self exercise. Ask your cardiologist to see if you need any cardiac rehab - quit smoking - follow up in 3 months   I spent more than 25 minutes of face to face time with the patient. Greater than 50% of time was spent in counseling and coordination of care. We discussed continue eliquis, cardiology follow, cardio rehab and BP monitoring at home   No orders of the defined types were placed in this encounter.   No orders of the defined types were placed in this encounter.   Patient Instructions  - continue eliquis and ASA and lipitor for stroke prevention - continue to follow up with cardiology for CHF and for aflutter s/p cardioversion - check BP at home and record - Follow up with your primary care physician for stroke risk factor modification. Recommend maintain blood pressure goal <130/80, diabetes with hemoglobin A1c goal below 7.0% and lipids with LDL cholesterol goal below 70 mg/dL.  - heart healthy diet and self exercise. Ask your cardiologist to see if you need any cardiac rehab - follow up in 3 months    Marvel Plan, MD PhD [facility] [address], [address] [place], [place] [address] [phone]

## 2017-11-08 NOTE — Patient Instructions (Signed)
-   continue eliquis and ASA and lipitor for stroke prevention - continue to follow up with cardiology for CHF and for aflutter s/p cardioversion - check BP at home and record - Follow up with your primary care physician for stroke risk factor modification. Recommend maintain blood pressure goal <130/80, diabetes with hemoglobin A1c goal below 7.0% and lipids with LDL cholesterol goal below 70 mg/dL.  - heart healthy diet and self exercise. Ask your cardiologist to see if you need any cardiac rehab - follow up in 3 months

## 2017-11-17 ENCOUNTER — Ambulatory Visit: Payer: Medicare PPO | Admitting: Cardiovascular Disease

## 2017-11-17 ENCOUNTER — Encounter: Payer: Self-pay | Admitting: Cardiovascular Disease

## 2017-11-17 VITALS — BP 172/88 | HR 60 | Ht 73.0 in | Wt 150.2 lb

## 2017-11-17 DIAGNOSIS — R05 Cough: Secondary | ICD-10-CM | POA: Diagnosis not present

## 2017-11-17 DIAGNOSIS — I5042 Chronic combined systolic (congestive) and diastolic (congestive) heart failure: Secondary | ICD-10-CM | POA: Diagnosis not present

## 2017-11-17 DIAGNOSIS — R0602 Shortness of breath: Secondary | ICD-10-CM

## 2017-11-17 DIAGNOSIS — Z7901 Long term (current) use of anticoagulants: Secondary | ICD-10-CM | POA: Diagnosis not present

## 2017-11-17 DIAGNOSIS — R059 Cough, unspecified: Secondary | ICD-10-CM

## 2017-11-17 DIAGNOSIS — E7800 Pure hypercholesterolemia, unspecified: Secondary | ICD-10-CM

## 2017-11-17 DIAGNOSIS — I483 Typical atrial flutter: Secondary | ICD-10-CM

## 2017-11-17 DIAGNOSIS — Z720 Tobacco use: Secondary | ICD-10-CM | POA: Diagnosis not present

## 2017-11-17 DIAGNOSIS — I10 Essential (primary) hypertension: Secondary | ICD-10-CM | POA: Diagnosis not present

## 2017-11-17 MED ORDER — SACUBITRIL-VALSARTAN 24-26 MG PO TABS: 1.0000 | ORAL_TABLET | Freq: Two times a day (BID) | ORAL | 5 refills | Status: DC

## 2017-11-17 NOTE — Progress Notes (Signed)
Cardiology Office Note   Date:  11/21/2017   ID:  Nathan Perez, DOB 25-Jun-1946, MRN [id number]  PCP:  Billee Cashing, MD  Cardiologist:   Chilton Si, MD   No chief complaint on file.     History of Present Illness: Nathan Perez is a 71 y.o. male with chronic systolic and diastolic heart failure, atherosclerosis of the aorta, hypertension, hyperlipidemia, TIA, stroke, atrial flutter, and recent tobacco abuse here for follow-up.  Nathan Perez was seen in the hospital 08/2017 for recurrent falls.  He reported new onset of gait imbalance and falls.  He noted weakness on the right side of his body.    In the ED he was also noted to have a R facial droop and mild slurred speech.  He had a head CT that showed mild chronic microvascular ischemic changes and parenchymal volume loss.  He did not receive t-PA due to the chronicity of his symptoms.  MRI of the brain showed several small, acute infarcts in the posterior L MCA territory.  There were also possible cerebellar infarcts.  Carotids were unremarkable on MRA.  The TEE schedule was unable to accomodate him today so there were plans to discharge him for outpatient TEE and loop recorder.   An echocardiogram was ordered which showed LVEF 25-30% with severe, diffuse hypokinesis. Right ventricular function was also apparently reduced.  EKG in the ED showed atrial flutter with variable ventricular response.  Cardiology was consulted due to newly diagnosed systolic heart failure.  He had a [employer] 09/11/17 that revealed LVEF 26%.  There was also concern for prior inferior infarct and anterior and anteroseptal ischemia.  Given the fact that he had no chest pain in his recent stroke this was deferred to the outpatient setting.  He underwent TEE 10/19/17 that showed LVEF 15% and no evidence of left atrial thrombus.  He had a successful cardioversion and remains in sinus rhythm.  Nathan Perez reports that he quit smoking 09/2017.  For the last 2-3  weeks Nathan Perez has noted a "fluid build up" in his chest.  He has no exertional dyspnea and denies lower extremity edema.  However he does report that he is gained 5 pounds over this time period.  He feels short of breath when lying down.  He also reports a productive cough.  He denies fever or chills.  He drinks a lot of milk, water, and orange juice.  He thinks he has less than 2 L of fluid daily.  He mostly cooks at home and tries to limit his salt intake.  He had a couple episodes of left-sided chest pressure 2 weeks ago.  This occurred while sitting down and watching TV.  It did not change with exertion.  It lasted for a few minutes.  He exercises by walking and stretching daily.  He denies chest pain or pressure with exertion.  His blood pressure at home typically is around 150/80.   Past Medical History:  Diagnosis Date  . Atrial flutter (HCC)   . Hypertension   . Stroke [facility])     Past Surgical History:  Procedure Laterality Date  . CARDIOVERSION N/A 10/19/2017   Procedure: CARDIOVERSION;  Surgeon: Thurmon Fair, MD;  Location: [facility] ENDOSCOPY;  Service: Cardiovascular;  Laterality: N/A;  . HERNIA REPAIR    . TEE WITHOUT CARDIOVERSION N/A 10/19/2017   Procedure: TRANSESOPHAGEAL ECHOCARDIOGRAM (TEE);  Surgeon: Thurmon Fair, MD;  Location: [facility] ENDOSCOPY;  Service: Cardiovascular;  Laterality: N/A;  Current Outpatient Medications  Medication Sig Dispense Refill  . apixaban (ELIQUIS) 5 MG TABS tablet Take 1 tablet (5 mg total) by mouth 2 (two) times daily. 60 tablet 2  . aspirin EC 81 MG EC tablet Take 1 tablet (81 mg total) by mouth daily. 30 tablet 3  . atorvastatin (LIPITOR) 20 MG tablet Take 1 tablet (20 mg total) by mouth at bedtime. 30 tablet 4  . metoprolol succinate (TOPROL-XL) 25 MG 24 hr tablet Take 1 tablet (25 mg total) by mouth daily. 30 tablet 3  . sacubitril-valsartan (ENTRESTO) 24-26 MG Take 1 tablet by mouth 2 (two) times daily. 60 tablet 5   No current  facility-administered medications for this visit.     Allergies:   Patient has no known allergies.    Social History:  The patient  reports that he quit smoking about 2 months ago. His smoking use included cigarettes. He smoked 0.50 packs per day. he has never used smokeless tobacco. He reports that he drinks alcohol. He reports that he does not use drugs.   Family History:  The patient's family history includes Dementia in his mother.    ROS:  Please see the history of present illness.   Otherwise, review of systems are positive for neck stiffness.   All other systems are reviewed and negative.    PHYSICAL EXAM: VS:  BP (!) 172/88   Pulse 60   Ht 6\' 1"  (1.854 m)   Wt 150 lb 3.2 oz (68.1 kg)   BMI 19.82 kg/m  , BMI Body mass index is 19.82 kg/m. GENERAL:  Well appearing HEENT:  Pupils equal round and reactive, fundi not visualized, oral mucosa unremarkable NECK:  No jugular venous distention, waveform within normal limits, carotid upstroke brisk and symmetric, no bruits, no thyromegaly LYMPHATICS:  No cervical adenopathy LUNGS:  Bilateral rhonchi.  No crackles or wheezes.  HEART:  RRR.  PMI not displaced or sustained,S1 and S2 within normal limits, no S3, no S4, no clicks, no rubs, no murmurs ABD:  Flat, positive bowel sounds normal in frequency in pitch, no bruits, no rebound, no guarding, no midline pulsatile mass, no hepatomegaly, no splenomegaly EXT:  2 plus pulses throughout, no edema, no cyanosis no clubbing SKIN:  No rashes no nodules NEURO:  Cranial nerves II through XII grossly intact, motor grossly intact throughout PSYCH:  Cognitively intact, oriented to person place and time   EKG:  EKG is ordered today. The ekg ordered today demonstrates sinus rhythm.  Rate 60 bpm  LVH with secondary repolarization abnormality.    Recent Labs: 09/08/2017: ALT 14 09/09/2017: TSH 3.672 10/18/2017: Platelets 276 10/19/2017: BUN 15; Creatinine, Ser 1.20; Hemoglobin 15.0; Potassium 4.4;  Sodium 141    Lipid Panel    Component Value Date/Time   CHOL 164 09/09/2017 0925   TRIG 60 09/09/2017 0925   HDL 64 09/09/2017 0925   CHOLHDL 2.6 09/09/2017 0925   VLDL 12 09/09/2017 0925   LDLCALC 88 09/09/2017 0925      Wt Readings from Last 3 Encounters:  11/17/17 150 lb 3.2 oz (68.1 kg)  11/08/17 149 lb 12.8 oz (67.9 kg)  10/19/17 150 lb (68 kg)      ASSESSMENT AND PLAN:  # Chronic systolic and diastolic heart failure:  Nathan Perez reports orthopnea and weight gain.  He appears euvolemic on exam.  We will stop losartan and start Entresto.  His weight is stable.  Repeat BMP next week.  I reiterated the importance of limiting  salt and fluid intake.  Continue metoprolol.  Check BMP in one week.  # Persistent atrial fibrillation: Nathan Perez remains in sinus rhythm after DCCV.  Continue metoprolol and Eliquis.  # Hypertension: BP elevated today.  [facility].  If his blood pressure remains elevated we will switch metoprolol to carvedilol at his follow-up appointment.  # Hyperlipidemia: LDL 88 08/2017.  Continue atorvastatin.    # Prior stroke: On aspirin and atorvastatin.    # Tobacco abuse: Patient quit smoking 09/2017.  He was congratulated on these efforts.  Current medicines are reviewed at length with the patient today.  The patient does not have concerns regarding medicines.  The following changes have been made:  [employer] Entrest[employer].  Stop losartan.   Labs/ tests ordered today include:   Orders Placed This Encounter  Procedures  . DG Chest 2 View     Disposition:   FU with Merriel Zinger C. Duke Salviaandolph, MD, [facility] in 1 week.    This note was written with the assistance of speech recognition software.  Please excuse any transcriptional errors.  Signed, Aunika Kirsten C. Duke Salviaandolph, MD, [facility]  11/21/2017 12:48 PM    [facility]

## 2017-11-17 NOTE — Patient Instructions (Addendum)
Medication Instructions:  STOP LOSARTAN   START ENTRESTO 24-26 MG TWICE A DAY   Labwork: NONE  Testing/Procedures: A chest x-ray takes a picture of the organs and structures inside the chest, including the heart, lungs, and blood vessels. This test can show several things, including, whether the heart is enlarges; whether fluid is building up in the lungs; and whether pacemaker / defibrillator leads are still in place.  Follow-Up: Your physician recommends that you schedule a follow-up appointment in: 1 WEEK   If you need a refill on your cardiac medications before your next appointment, please call your pharmacy.

## 2017-11-18 ENCOUNTER — Ambulatory Visit
Admission: RE | Admit: 2017-11-18 | Discharge: 2017-11-18 | Disposition: A | Discharge status: Home / Self Care | Payer: Medicare PPO | Source: Ambulatory Visit | Attending: Cardiovascular Disease | Admitting: Cardiovascular Disease

## 2017-11-21 ENCOUNTER — Encounter: Payer: Self-pay | Admitting: Cardiovascular Disease

## 2017-11-23 ENCOUNTER — Ambulatory Visit: Payer: Medicare PPO | Admitting: Cardiovascular Disease

## 2017-12-03 ENCOUNTER — Telehealth: Payer: Self-pay | Admitting: Cardiovascular Disease

## 2017-12-03 MED ORDER — SACUBITRIL-VALSARTAN 24-26 MG PO TABS: 1.0000 | ORAL_TABLET | Freq: Two times a day (BID) | ORAL | 0 refills | Status: DC

## 2017-12-03 NOTE — Telephone Encounter (Signed)
°*  STAT* If patient is at the pharmacy, call can be transferred to refill team.   1. Which medications need to be refilled? (please list name of each medication and dose if known) sacubitril- valsartan 24-26 mg  2. Which pharmacy/location (including street and city if local pharmacy) is medication to be sent to? [facility] - , [place] - [address]  3. Do they need a 30 day or 90 day supply? 30 day

## 2018-02-07 NOTE — Progress Notes (Deleted)
[facility]  PATIENT: Nathan Perez DOB: Feb 28, 1946   REASON FOR VISIT: Follow-up for stroke HISTORY FROM:    HISTORY OF PRESENT ILLNESS: Mr.Nathan L Harrisis a 72 y.o.malewith history of tobaccoabuse and no known other vascular risk factors admitted on 09/09/17 after 2 falls. MRI showed 4 punctate L MCA territory acute infarcts including the posterolateral left motor strip, and right cerebellum. MRA head and neck, DVT negative. However, EF 25-30% on TTE. Had cardiology consult and found pt had EKG in ER showed aflutter. LDL 88 and A1C 5.1. He was put on eliquis and lipitor. He was discharged in good condition with plan for cardioversion and TEE 4 weeks after discharge.  Interval History During the interval time, the patient has been doing well. Still has mild right facial droop but stated that his slurry speech much improved. He followed with cardiology and had cardioversion on 10/19/17. However, TEE at the same time showed EF 15-20%. He is on cozaar and metoprolol. He complains of orthopnea at night. He has appointment with cardiology on 11/17/17. BP today in clinic 140/72.    REVIEW OF SYSTEMS: Full 14 system review of systems performed and notable only for those listed, all others are neg:  Constitutional: neg  Cardiovascular: neg Ear/Nose/Throat: neg  Skin: neg Eyes: neg Respiratory: neg Gastroitestinal: neg  Hematology/Lymphatic: neg  Endocrine: neg Musculoskeletal:neg Allergy/Immunology: neg Neurological: neg Psychiatric: neg Sleep : neg   ALLERGIES: No Known Allergies  HOME MEDICATIONS: Outpatient Medications Prior to Visit  Medication Sig Dispense Refill  . apixaban (ELIQUIS) 5 MG TABS tablet Take 1 tablet (5 mg total) by mouth 2 (two) times daily. 60 tablet 2  . aspirin EC 81 MG EC tablet Take 1 tablet (81 mg total) by mouth daily. 30 tablet 3  . atorvastatin (LIPITOR) 20 MG tablet Take 1 tablet (20 mg total) by mouth at bedtime. 30 tablet  4  . metoprolol succinate (TOPROL-XL) 25 MG 24 hr tablet Take 1 tablet (25 mg total) by mouth daily. 30 tablet 3  . sacubitril-valsartan (ENTRESTO) 24-26 MG Take 1 tablet by mouth 2 (two) times daily. 14 tablet 0   No facility-administered medications prior to visit.     PAST MEDICAL HISTORY: Past Medical History:  Diagnosis Date  . Atrial flutter (HCC)   . Hypertension   . Stroke [facility])     PAST SURGICAL HISTORY: Past Surgical History:  Procedure Laterality Date  . CARDIOVERSION N/A 10/19/2017   Procedure: CARDIOVERSION;  Surgeon: Thurmon Fair, MD;  Location: [facility] ENDOSCOPY;  Service: Cardiovascular;  Laterality: N/A;  . HERNIA REPAIR    . TEE WITHOUT CARDIOVERSION N/A 10/19/2017   Procedure: TRANSESOPHAGEAL ECHOCARDIOGRAM (TEE);  Surgeon: Thurmon Fair, MD;  Location: [facility] ENDOSCOPY;  Service: Cardiovascular;  Laterality: N/A;    FAMILY HISTORY: Family History  Problem Relation Age of Onset  . Dementia Mother     SOCIAL HISTORY: Social History   Socioeconomic History  . Marital status: Married    Spouse name: Not on file  . Number of children: Not on file  . Years of education: Not on file  . Highest education level: Not on file  Social Needs  . Financial resource strain: Not on file  . Food insecurity - worry: Not on file  . Food insecurity - inability: Not on file  . Transportation needs - medical: Not on file  . Transportation needs - non-medical: Not on file  Occupational History  . Not on file  Tobacco Use  .  Smoking status: Former Smoker    Packs/day: 0.50    Types: Cigarettes    Last attempt to quit: 09/13/2017    Years since quitting: 0.4  . Smokeless tobacco: Never Used  Substance and Sexual Activity  . Alcohol use: Yes    Comment: quit drinking 07/2017  . Drug use: No  . Sexual activity: Not on file  Other Topics Concern  . Not on file  Social History Narrative  . Not on file     PHYSICAL EXAM  There were no vitals filed for this  visit. There is no height or weight on file to calculate BMI.  Generalized: Well developed, in no acute distress  Head: normocephalic and atraumatic,. Oropharynx benign  Neck: Supple, no carotid bruits  Cardiac: Regular rate rhythm, no murmur  Musculoskeletal: No deformity   Neurological examination   Mentation: Alert oriented to time, place, history taking. Attention span and concentration appropriate. Recent and remote memory intact.  Follows all commands speech and language fluent.   Cranial nerve II-XII: Fundoscopic exam reveals sharp disc margins.Pupils were equal round reactive to light extraocular movements were full, visual field were full on confrontational test. Facial sensation and strength were normal. hearing was intact to finger rubbing bilaterally. Uvula tongue midline. head turning and shoulder shrug were normal and symmetric.Tongue protrusion into cheek strength was normal. Motor: normal bulk and tone, full strength in the BUE, BLE, fine finger movements normal, no pronator drift. No focal weakness Sensory: normal and symmetric to light touch, pinprick, and  Vibration, proprioception  Coordination: finger-nose-finger, heel-to-shin bilaterally, no dysmetria Reflexes: Brachioradialis 2/2, biceps 2/2, triceps 2/2, patellar 2/2, Achilles 2/2, plantar responses were flexor bilaterally. Gait and Station: Rising up from seated position without assistance, normal stance,  moderate stride, good arm swing, smooth turning, able to perform tiptoe, and heel walking without difficulty. Tandem gait is steady  DIAGNOSTIC DATA (LABS, IMAGING, TESTING) - I reviewed patient records, labs, notes, testing and imaging myself where available.  Lab Results  Component Value Date   WBC 5.3 10/18/2017   HGB 15.0 10/19/2017   HCT 44.0 10/19/2017   MCV 99 (H) 10/18/2017   PLT 276 10/18/2017      Component Value Date/Time   NA 141 10/19/2017 1235   NA 145 (H) 10/18/2017 1029   K 4.4 10/19/2017  1235   CL 104 10/19/2017 1235   CO2 25 10/18/2017 1029   GLUCOSE 82 10/19/2017 1235   BUN 15 10/19/2017 1235   BUN 11 10/18/2017 1029   CREATININE 1.20 10/19/2017 1235   CALCIUM 10.3 (H) 10/18/2017 1029   PROT 7.3 09/08/2017 2008   ALBUMIN 4.0 09/08/2017 2008   AST 23 09/08/2017 2008   ALT 14 (L) 09/08/2017 2008   ALKPHOS 63 09/08/2017 2008   BILITOT 0.8 09/08/2017 2008   GFRNONAA 59 (L) 10/18/2017 1029   GFRAA 69 10/18/2017 1029   Lab Results  Component Value Date   CHOL 164 09/09/2017   HDL 64 09/09/2017   LDLCALC 88 09/09/2017   TRIG 60 09/09/2017   CHOLHDL 2.6 09/09/2017   Lab Results  Component Value Date   HGBA1C 5.1 09/09/2017   No results found for: [id number] Lab Results  Component Value Date   TSH 3.672 09/09/2017      ASSESSMENT AND PLAN 72 y.o. African American male with PMH of smoker admitted on  09/09/17 after 2 falls. MRI showed 4 punctate L MCA territory acute infarcts including the posterolateral left motor strip, and right cerebellum.  MRA head and neck, DVT negative. However, EF 25-30% on TTE. Had cardiology consult and found pt had EKG in ER showed aflutter. LDL 88 and A1C 5.1. He was put on eliquis and lipitor. He followed with cardiology and had cardioversion on 10/19/17. However, TEE at the same time showed EF 15-20%. He complains of orthopnea at night. He has appointment with cardiology on 11/17/17.  Plan:  - continue eliquis and ASA and lipitor for stroke prevention - continue to follow up with cardiology for CHF and for aflutter s/p cardioversion - check BP at home and record - Follow up with your primary care physician for stroke risk factor modification. Recommend maintain blood pressure goal <130/80, diabetes with hemoglobin A1c goal below 7.0% and lipids with LDL cholesterol goal below 70 mg/dL.  - heart healthy diet and self exercise. Ask your cardiologist to see if you need any cardiac rehab - quit smoking - follow up in 3 months    Nilda Riggs, [facility], [facility], APRN  [facility] [address] [place], [place] [address] [phone]

## 2018-02-08 ENCOUNTER — Ambulatory Visit: Payer: Medicare PPO | Admitting: Nurse Practitioner

## 2018-02-24 ENCOUNTER — Encounter: Payer: Self-pay | Admitting: Physician Assistant

## 2018-02-24 ENCOUNTER — Ambulatory Visit: Payer: Medicare HMO | Admitting: Physician Assistant

## 2018-02-24 VITALS — BP 136/74 | HR 62 | Ht 73.0 in | Wt 152.2 lb

## 2018-02-24 DIAGNOSIS — I10 Essential (primary) hypertension: Secondary | ICD-10-CM | POA: Diagnosis not present

## 2018-02-24 DIAGNOSIS — I5042 Chronic combined systolic (congestive) and diastolic (congestive) heart failure: Secondary | ICD-10-CM | POA: Diagnosis not present

## 2018-02-24 DIAGNOSIS — E785 Hyperlipidemia, unspecified: Secondary | ICD-10-CM

## 2018-02-24 DIAGNOSIS — Z79899 Other long term (current) drug therapy: Secondary | ICD-10-CM | POA: Diagnosis not present

## 2018-02-24 DIAGNOSIS — I4892 Unspecified atrial flutter: Secondary | ICD-10-CM

## 2018-02-24 DIAGNOSIS — Z8673 Personal history of transient ischemic attack (TIA), and cerebral infarction without residual deficits: Secondary | ICD-10-CM

## 2018-02-24 MED ORDER — SACUBITRIL-VALSARTAN 49-51 MG PO TABS: 1.0000 | ORAL_TABLET | Freq: Two times a day (BID) | ORAL | 3 refills | Status: DC

## 2018-02-24 MED ORDER — APIXABAN 5 MG PO TABS: 5.0000 mg | ORAL_TABLET | Freq: Two times a day (BID) | ORAL | 1 refills | Status: DC

## 2018-02-24 NOTE — Progress Notes (Addendum)
Cardiology Office Note    Date:  02/24/2018   ID:  Nathan Perez, DOB 01/23/1946, MRN [id number]  PCP:  Patient, No Pcp Per  Cardiologist:  Dr. Duke Salviaandolph  Chief Complaint  Patient presents with  . Follow-up    pt has no new complaints     History of Present Illness:  Nathan Perez is a 72 y.o. male with PMH of chronic systolic and diastolic heart failure, aortic atherosclerosis, hypertension, hyperlipidemia, TIA, atrial flutter, history of CVA, atrial flutter and tobacco abuse.  Patient was seen in September 2018 for recurrent fall.  He noted gait imbalance and right-sided weakness.  While in the ED, he was noted to have right facial droop and mild slurred speech.  CT of the head showed mild chronic microvascular ischemic changes and a parenchymal volume loss.  He did not receive TPA due to chronicity of his symptoms.  MRI of the brain showed several small acute infarct in the posterior left MCA territory.  There was also possible cerebellar infarct as well.  No significant stenosis identified in the carotid vessels on the MRA.  Echocardiogram at the time showed EF 25-30% with severe diffuse hypokinesis.  RV function was also reduced.  EKG in the ED showed atrial flutter with variable ventricular response.  He underwent Myoview on 09/11/2018 which showed EF 26%, concern of prior inferior infarct and anterior and anteroseptal ischemia.  However patient had no chest pain, therefore further workup was deferred as outpatient.  He underwent TEE on 10/19/2017 which showed EF 15%, no evidence of left atrial thrombus.  He had a successful cardioversion.   Patient was last seen by Dr. Duke Salviaandolph on 11/17/2017, [facility]  He presents today for cardiology office visit.  He denies any significant chest discomfort, shortness of breath, lower extremity edema, orthopnea or PND.  He is tolerating the Entresto very well.  Unfortunately he is using the Eliquis only once a day, we instructed  the patient to use it twice daily as instructed.  Otherwise, I plan to continue to uptitrate his heart failure medication.  I will increase his Entresto to 49-51 mg twice daily today.  He will need a basic metabolic panel in 1 week, I plan to bring the patient back in 2 weeks for up titration of Entresto and the Toprol-XL.  Once we fully maximize his heart failure medication, I plan to obtain a 2763-month repeat echocardiogram to take a look at his ejection fraction.  Note I did not obtain a EKG today, however patient is in sinus rhythm based on my physical exam.    Past Medical History:  Diagnosis Date  . Atrial flutter (HCC)   . Hypertension   . Stroke [facility](HCC)     Past Surgical History:  Procedure Laterality Date  . CARDIOVERSION N/A 10/19/2017   Procedure: CARDIOVERSION;  Surgeon: Thurmon Fairroitoru, Mihai, MD;  Location: [facility] ENDOSCOPY;  Service: Cardiovascular;  Laterality: N/A;  . HERNIA REPAIR    . TEE WITHOUT CARDIOVERSION N/A 10/19/2017   Procedure: TRANSESOPHAGEAL ECHOCARDIOGRAM (TEE);  Surgeon: Thurmon Fairroitoru, Mihai, MD;  [facility];  Service: Cardiovascular;  Laterality: N/A;    Current Medications: Outpatient Medications Prior to Visit  Medication Sig Dispense Refill  . aspirin EC 81 MG EC tablet Take 1 tablet (81 mg total) by mouth daily. 30 tablet 3  . atorvastatin (LIPITOR) 20 MG tablet Take 1 tablet (20 mg total) by mouth at bedtime. 30 tablet 4  . metoprolol succinate (TOPROL-XL)  25 MG 24 hr tablet Take 1 tablet (25 mg total) by mouth daily. 30 tablet 3  . apixaban (ELIQUIS) 5 MG TABS tablet Take 1 tablet (5 mg total) by mouth 2 (two) times daily. (Patient taking differently: Take 5 mg by mouth daily. ) 60 tablet 2  . sacubitril-valsartan (ENTRESTO) 24-26 MG Take 1 tablet by mouth 2 (two) times daily. 14 tablet 0   No facility-administered medications prior to visit.      Allergies:   Patient has no known allergies.   Social History   Socioeconomic History  . Marital status:  Married    Spouse name: None  . Number of children: None  . Years of education: None  . Highest education level: None  Social Needs  . Financial resource strain: None  . Food insecurity - worry: None  . Food insecurity - inability: None  . Transportation needs - medical: None  . Transportation needs - non-medical: None  Occupational History  . None  Tobacco Use  . Smoking status: Former Smoker    Packs/day: 0.50    Types: Cigarettes    Last attempt to quit: 09/13/2017    Years since quitting: 0.4  . Smokeless tobacco: Never Used  Substance and Sexual Activity  . Alcohol use: Yes    Comment: quit drinking 07/2017  . Drug use: No  . Sexual activity: None  Other Topics Concern  . None  Social History Narrative  . None     Family History:  The patient's family history includes Dementia in his mother.   ROS:   Please see the history of present illness.    ROS All other systems reviewed and are negative.   PHYSICAL EXAM:   VS:  BP 136/74 (BP Location: Left Arm, Patient Position: Sitting, Cuff Size: Normal)   Pulse 62   Ht 6\' 1"  (1.854 m)   Wt 152 lb 3.2 oz (69 kg)   SpO2 96%   BMI 20.08 kg/m    GEN: Well nourished, well developed, in no acute distress  HEENT: normal  Neck: no JVD, carotid bruits, or masses Cardiac: RRR; no murmurs, rubs, or gallops,no edema  Respiratory:  clear to auscultation bilaterally, normal work of breathing GI: soft, nontender, nondistended, + BS MS: no deformity or atrophy  Skin: warm and dry, no rash Neuro:  Alert and Oriented x 3, Strength and sensation are intact Psych: euthymic mood, full affect  Wt Readings from Last 3 Encounters:  02/24/18 152 lb 3.2 oz (69 kg)  11/17/17 150 lb 3.2 oz (68.1 kg)  11/08/17 149 lb 12.8 oz (67.9 kg)      Studies/Labs Reviewed:   EKG:  EKG is not ordered today.   Recent Labs: 09/08/2017: ALT 14 09/09/2017: TSH 3.672 10/18/2017: Platelets 276 10/19/2017: BUN 15; Creatinine, Ser 1.20; Hemoglobin  15.0; Potassium 4.4; Sodium 141   Lipid Panel    Component Value Date/Time   CHOL 164 09/09/2017 0925   TRIG 60 09/09/2017 0925   HDL 64 09/09/2017 0925   CHOLHDL 2.6 09/09/2017 0925   VLDL 12 09/09/2017 0925   LDLCALC 88 09/09/2017 0925    Additional studies/ records that were reviewed today include:   TEE 10/19/2017 LV EF: 15% -   20%  Study Conclusions  - Left ventricle: The cavity size was normal. Wall thickness was   normal. Systolic function was severely reduced. The estimated   ejection fraction was in the range of 15% to 20%. Severe diffuse   hypokinesis with  no identifiable regional variations. No evidence   of thrombus. No evidence of thrombus. - Left atrium: No evidence of thrombus in the atrial cavity or   appendage. No evidence of thrombus in the atrial cavity or   appendage. No evidence of thrombus in the appendage. - Right ventricle: Systolic function was moderately to severely   reduced. - Right atrium: The atrium was dilated. No evidence of thrombus in   the atrial cavity or appendage. - Pulmonary arteries: Systolic pressure was mildly increased. PA   peak pressure: 34 mm Hg (S).  Impressions:  - Successful cardioversion. No cardiac source of emboli was   indentified.   ASSESSMENT:    1. Essential hypertension   2. Medication management   3. Chronic combined systolic and diastolic heart failure (HCC)   4. Hyperlipidemia, unspecified hyperlipidemia type   5. H/O: CVA (cerebrovascular accident)   6. Atrial flutter, unspecified type (HCC)      PLAN:  In order of problems listed above:  1. Chronic combined systolic and diastolic heart failure: Patient appears to be euvolemic on today's physical exam.  He is not on any diuretic at this time.  He was started on Entresto during the last office visit.  He is tolerating the medication without significant issue.  I will increase Entresto to 49-51 mg today.  He is also on Toprol-XL.  I will bring the  patient back in 2 weeks for further up titration of heart failure medication.  He will need 1 week basic metabolic panel to check renal function and electrolyte.  Once his heart failure medications fully titrated, I plan to obtain 70-month repeat echocardiogram to reassess ejection fraction.  Once the ejection fraction normalized, potentially consider discontinue his aspirin.  2. Paroxysmal atrial flutter: Underwent TEE cardioversion in November 2018.  He is still in sinus rhythm based on today's physical exam.  He has been taking Eliquis only once a day, instructed the patient to take the Eliquis twice a day.  3. Hypertension: Blood pressure remain elevated today, increase Entresto to 49-51 mg twice daily.  4. Hyperlipidemia: On Lipitor 20 mg daily.  Last lipid panel in Sept 2018 showed well-controlled total cholesterol, LDL, HDL, and the triglycerides.    Medication Adjustments/Labs and Tests Ordered: Current medicines are reviewed at length with the patient today.  Concerns regarding medicines are outlined above.  Medication changes, Labs and Tests ordered today are listed in the Patient Instructions below. Patient Instructions  Azalee Course, PA has recommended making the following medication changes: 1. INCREASE Entresto to 49/51 mg TWICE daily 2. TAKE ELIQUIS TWICE DAILY  Your physician recommends that you return for lab work in 1 week.  Wynema Birch recommends that you schedule a follow-up appointment in 2-3 weeks.  If you need a refill on your cardiac medications before your next appointment, please call your pharmacy.    Ramond Dial, [place]  02/24/2018 10:08 AM    [facility] [address] [place], [place], [place]  [address] Phone: [phone]; Fax: [phone]

## 2018-02-24 NOTE — Patient Instructions (Signed)
Nathan Perez, Nathan Perez has recommended making the following medication changes: 1. INCREASE Entresto to 49/51 mg TWICE daily 2. TAKE ELIQUIS TWICE DAILY  Your physician recommends that you return for lab work in 1 week.  Nathan BirchHao recommends that you schedule a follow-up appointment in 2-3 weeks.  If you need a refill on your cardiac medications before your next appointment, please call your pharmacy.

## 2018-03-02 ENCOUNTER — Telehealth: Payer: Self-pay | Admitting: Cardiovascular Disease

## 2018-03-02 NOTE — Telephone Encounter (Signed)
New message    Asher MuirJamie from [employer] calling regarding pre auth. Ref key [id number]  Pt c/o medication issue:  1. Name of Medication: sacubitril-valsartan (ENTRESTO) 49-51 MG  2. How are you currently taking this medication (dosage and times per day)? AS PRESCRIBED  3. Are you having a reaction (difficulty breathing--STAT)? NO  4. What is your medication issue?  PRE AUTH

## 2018-03-02 NOTE — Telephone Encounter (Signed)
COVER MYMED PRIOR AUTHORIZATION STARTED FOR 49/51 ENTRESTO  AWAITING ON ANSWER FROM [facility]

## 2018-03-03 LAB — BASIC METABOLIC PANEL
BUN/Creatinine Ratio: 10 (ref 10–24)
BUN: 11 mg/dL (ref 8–27)
CALCIUM: 9.7 mg/dL (ref 8.6–10.2)
CHLORIDE: 107 mmol/L — AB (ref 96–106)
CO2: 25 mmol/L (ref 20–29)
Creatinine, Ser: 1.13 mg/dL (ref 0.76–1.27)
GFR calc Af Amer: 74 mL/min/{1.73_m2} (ref >59)
GFR calc non Af Amer: 64 mL/min/{1.73_m2} (ref >59)
GLUCOSE: 84 mg/dL (ref 65–99)
POTASSIUM: 4.4 mmol/L (ref 3.5–5.2)
Sodium: 145 mmol/L — ABNORMAL HIGH (ref 134–144)

## 2018-03-04 NOTE — Progress Notes (Signed)
Kidney function and electrolyte stable on Entresto.

## 2018-03-15 NOTE — Telephone Encounter (Signed)
Patient has been approved through 03/02/20, [facility]

## 2018-03-17 ENCOUNTER — Encounter: Payer: Self-pay | Admitting: Physician Assistant

## 2018-03-17 ENCOUNTER — Ambulatory Visit: Payer: Medicare HMO | Admitting: Physician Assistant

## 2018-03-17 VITALS — BP 146/74 | HR 58 | Ht 73.0 in | Wt 157.0 lb

## 2018-03-17 DIAGNOSIS — I10 Essential (primary) hypertension: Secondary | ICD-10-CM | POA: Diagnosis not present

## 2018-03-17 DIAGNOSIS — I5042 Chronic combined systolic (congestive) and diastolic (congestive) heart failure: Secondary | ICD-10-CM | POA: Diagnosis not present

## 2018-03-17 DIAGNOSIS — Z8673 Personal history of transient ischemic attack (TIA), and cerebral infarction without residual deficits: Secondary | ICD-10-CM | POA: Diagnosis not present

## 2018-03-17 DIAGNOSIS — I4892 Unspecified atrial flutter: Secondary | ICD-10-CM | POA: Diagnosis not present

## 2018-03-17 DIAGNOSIS — E785 Hyperlipidemia, unspecified: Secondary | ICD-10-CM | POA: Diagnosis not present

## 2018-03-17 MED ORDER — SACUBITRIL-VALSARTAN 97-103 MG PO TABS: 1.0000 | ORAL_TABLET | Freq: Two times a day (BID) | ORAL | 5 refills | Status: DC

## 2018-03-17 NOTE — Patient Instructions (Signed)
Medication Instructions: Azalee CourseHao Meng, PA-c has recommended making the following medication changes: 1. INCREASE Entresto to 97-103 mg twice daily  Labwork: Your physician recommends that you return for lab work in 1 week.  Testing/Procedures: 1. Echocardiogram - Your physician has requested that you have an echocardiogram. Echocardiography is a painless test that uses sound waves to create images of your heart. It provides your doctor with information about the size and shape of your heart and how well your heart's chambers and valves are working. This procedure takes approximately one hour. There are no restrictions for this procedure. >>This [facility] performed at [facility] [address], [address] [place] [address] [phone]  Follow-up: Wynema BirchHao recommends that you schedule a follow-up appointment after echocardiogram with Dr Duke Salviaandolph.  If you need a refill on your cardiac medications before your next appointment, please call your pharmacy.

## 2018-03-17 NOTE — Progress Notes (Signed)
Cardiology Office Note    Date:  03/18/2018   ID:  [name], DOB 12/17/1945, MRN [id number]  PCP:  Patient, No Pcp Per  Cardiologist:  Dr. Duke Salvia  Chief Complaint  Patient presents with  . Chest Pain    pt states every now and then a little pain on right side for just a second or two     History of Present Illness:  Nathan Perez is a 72 y.o. male with PMH of chronic systolic and diastolic heart failure, aortic atherosclerosis, hypertension, hyperlipidemia, TIA/CVA, atrial flutter and tobacco abuse.  Patient was seen in September 2018 for recurrent fall.  He noted gait imbalance and right-sided weakness.  While in the ED, he was noted to have right facial droop and mild slurred speech.  CT of the head showed mild chronic microvascular ischemic changes and a parenchymal volume loss.  He did not receive TPA due to chronicity of his symptoms.  MRI of the brain showed several small acute infarct in the posterior left MCA territory.  There was also possible cerebellar infarct as well.  No significant stenosis identified in the carotid vessels on the MRA.  Echocardiogram at the time showed EF 25-30% with severe diffuse hypokinesis.  RV function was also reduced.  EKG in the ED showed atrial flutter with variable ventricular response.  He underwent Myoview on 09/11/2017 which showed EF 26%, concern of prior inferior infarct and anterior and anteroseptal ischemia.  However patient had no chest pain, therefore further workup was deferred as outpatient.  He underwent TEE on 10/19/2017 which showed EF 15%, no evidence of left atrial thrombus.  He had a successful cardioversion.   Patient was last seen by Dr. Duke Salvia on 11/17/2017, his losartan was switched to [facility].  I last saw the patient on 02/24/2018, he was using Eliquis only once a day, we instructed the patient to start using it twice a day.  I also up titrated his Entresto to 49-51 mg twice a day.  1 week basic metabolic panel showed stable  renal function and electrolyte.  Patient presents today for cardiology office visit.  He occasionally has a twinge in the chest that last only a second each time.  Otherwise denies any chest pain.  He denies any shortness of breath, lower extremity edema, orthopnea or PND.  He is actually feeling very well on Entresto.  I will increase his Entresto dose to 97-103 mg twice a day.  He has been compliant with Eliquis twice a day.  I will did not obtain EKG today, however his heart rate is extremely regular consistent with a sinus rhythm.  I was unable to uptitrate Toprol-XL given heart rate in the 50s.  I plan to obtain a basic metabolic panel in 1 week to assess his renal function and electrolytes.  He will need a repeat echocardiogram in 3 months before his next visit with Dr. Duke Salvia.    Past Medical History:  Diagnosis Date  . Atrial flutter (HCC)   . Hypertension   . Stroke [facility])     Past Surgical History:  Procedure Laterality Date  . CARDIOVERSION N/A 10/19/2017   Procedure: CARDIOVERSION;  Surgeon: Thurmon Fair, MD;  Location: [facility] ENDOSCOPY;  Service: Cardiovascular;  Laterality: N/A;  . HERNIA REPAIR    . TEE WITHOUT CARDIOVERSION N/A 10/19/2017   Procedure: TRANSESOPHAGEAL ECHOCARDIOGRAM (TEE);  Surgeon: Thurmon Fair, MD;  Location: [facility] ENDOSCOPY;  Service: Cardiovascular;  Laterality: N/A;    Current Medications: Outpatient Medications  Prior to Visit  Medication Sig Dispense Refill  . apixaban (ELIQUIS) 5 MG TABS tablet Take 1 tablet (5 mg total) by mouth 2 (two) times daily. 180 tablet 1  . aspirin EC 81 MG EC tablet Take 1 tablet (81 mg total) by mouth daily. 30 tablet 3  . atorvastatin (LIPITOR) 20 MG tablet Take 1 tablet (20 mg total) by mouth at bedtime. 30 tablet 4  . metoprolol succinate (TOPROL-XL) 25 MG 24 hr tablet Take 1 tablet (25 mg total) by mouth daily. 30 tablet 3  . sacubitril-valsartan (ENTRESTO) 49-51 MG Take 1 tablet by mouth 2 (two) times daily. 180 tablet 3    No facility-administered medications prior to visit.      Allergies:   Patient has no known allergies.   Social History   Socioeconomic History  . Marital status: Married    Spouse name: Not on file  . Number of children: Not on file  . Years of education: Not on file  . Highest education level: Not on file  Occupational History  . Not on file  Social Needs  . Financial resource strain: Not on file  . Food insecurity:    Worry: Not on file    Inability: Not on file  . Transportation needs:    Medical: Not on file    Non-medical: Not on file  Tobacco Use  . Smoking status: Former Smoker    Packs/day: 0.50    Types: Cigarettes    Last attempt to quit: 09/13/2017    Years since quitting: 0.5  . Smokeless tobacco: Never Used  Substance and Sexual Activity  . Alcohol use: Yes    Comment: quit drinking 07/2017  . Drug use: No  . Sexual activity: Not on file  Lifestyle  . Physical activity:    Days per week: Not on file    Minutes per session: Not on file  . Stress: Not on file  Relationships  . Social connections:    Talks on phone: Not on file    Gets together: Not on file    Attends religious service: Not on file    Active member of club or organization: Not on file    Attends meetings of clubs or organizations: Not on file    Relationship status: Not on file  Other Topics Concern  . Not on file  Social History Narrative  . Not on file     Family History:  The patient's family history includes Dementia in his mother.   ROS:   Please see the history of present illness.    ROS All other systems reviewed and are negative.   PHYSICAL EXAM:   VS:  BP (!) 146/74   Pulse (!) 58   Ht 6\' 1"  (1.854 m)   Wt 157 lb (71.2 kg)   SpO2 96%   BMI 20.71 kg/m    GEN: Well nourished, well developed, in no acute distress  HEENT: normal  Neck: no JVD, carotid bruits, or masses Cardiac: RRR; no murmurs, rubs, or gallops,no edema  Respiratory:  clear to auscultation  bilaterally, normal work of breathing GI: soft, nontender, nondistended, + BS MS: no deformity or atrophy  Skin: warm and dry, no rash Neuro:  Alert and Oriented x 3, Strength and sensation are intact Psych: euthymic mood, full affect  Wt Readings from Last 3 Encounters:  03/17/18 157 lb (71.2 kg)  02/24/18 152 lb 3.2 oz (69 kg)  11/17/17 150 lb 3.2 oz (68.1 kg)  Studies/Labs Reviewed:   EKG:  EKG is not ordered today.    Recent Labs: 09/08/2017: ALT 14 09/09/2017: TSH 3.672 10/18/2017: Platelets 276 10/19/2017: Hemoglobin 15.0 03/03/2018: BUN 11; Creatinine, Ser 1.13; Potassium 4.4; Sodium 145   Lipid Panel    Component Value Date/Time   CHOL 164 09/09/2017 0925   TRIG 60 09/09/2017 0925   HDL 64 09/09/2017 0925   CHOLHDL 2.6 09/09/2017 0925   VLDL 12 09/09/2017 0925   LDLCALC 88 09/09/2017 0925    Additional studies/ records that were reviewed today include:   TEE 10/19/2017 LV EF: 15% -   20% Study Conclusions  - Left ventricle: The cavity size was normal. Wall thickness was   normal. Systolic function was severely reduced. The estimated   ejection fraction was in the range of 15% to 20%. Severe diffuse   hypokinesis with no identifiable regional variations. No evidence   of thrombus. No evidence of thrombus. - Left atrium: No evidence of thrombus in the atrial cavity or   appendage. No evidence of thrombus in the atrial cavity or   appendage. No evidence of thrombus in the appendage. - Right ventricle: Systolic function was moderately to severely   reduced. - Right atrium: The atrium was dilated. No evidence of thrombus in   the atrial cavity or appendage. - Pulmonary arteries: Systolic pressure was mildly increased. PA   peak pressure: 34 mm Hg (S).  Impressions:  - Successful cardioversion. No cardiac source of emboli was   indentified.   ASSESSMENT:    1. Chronic combined systolic and diastolic congestive heart failure (HCC)   2. Essential  hypertension   3. Hyperlipidemia, unspecified hyperlipidemia type   4. H/O: CVA (cerebrovascular accident)   5. Atrial flutter, unspecified type (HCC)      PLAN:  In order of problems listed above:  1. Chronic combined systolic and diastolic heart failure: He is euvolemic on physical exam.  Continue Toprol-XL and Entresto.  Uptitrate Entresto to 97-103 mg twice daily.  Plan to repeat echocardiogram in 3 months.  1 week basic metabolic panel  2. Hypertension: Blood pressure mildly elevated, up titrate Entresto.  3. Hyperlipidemia: On Lipitor 20 mg daily.  Last lipid panel in September 2018 showed total cholesterol 164, triglycerides 60, HDL 64, LDL 88.  4. History of CVA: No recurrence.  5. Atrial flutter: On Eliquis 5 mg twice daily.  Continue Toprol-XL for rate control.    Medication Adjustments/Labs and Tests Ordered: Current medicines are reviewed at length with the patient today.  Concerns regarding medicines are outlined above.  Medication changes, Labs and Tests ordered today are listed in the Patient Instructions below. Patient Instructions  Medication Instructions: Azalee Course, PA-c has recommended making the following medication changes: 1. INCREASE Entresto to 97-103 mg twice daily  Labwork: Your physician recommends that you return for lab work in 1 week.  Testing/Procedures: 1. Echocardiogram - Your physician has requested that you have an echocardiogram. Echocardiography is a painless test that uses sound waves to create images of your heart. It provides your doctor with information about the size and shape of your heart and how well your heart's chambers and valves are working. This procedure takes approximately one hour. There are no restrictions for this procedure. >>This will be performed at our [facility] location [address] [place], [address] [place] [address] [phone]  Follow-up: Wynema Birch recommends that you schedule a follow-up appointment after echocardiogram  with Dr Duke Salvia.  If you need a refill  on your cardiac medications before your next appointment, please call your pharmacy.     Ramond DialSigned, Madaleine Simmon, [place]  03/18/2018 3:41 PM    [facility] [address], [place], [place]  [address] Phone: [phone]; Fax: [phone]

## 2018-03-18 ENCOUNTER — Encounter: Payer: Self-pay | Admitting: Physician Assistant

## 2018-03-25 LAB — BASIC METABOLIC PANEL
BUN / CREAT RATIO: 11 (ref 10–24)
BUN: 12 mg/dL (ref 8–27)
CO2: 25 mmol/L (ref 20–29)
CREATININE: 1.12 mg/dL (ref 0.76–1.27)
Calcium: 9.7 mg/dL (ref 8.6–10.2)
Chloride: 105 mmol/L (ref 96–106)
GFR, EST AFRICAN AMERICAN: 75 mL/min/{1.73_m2} (ref >59)
GFR, EST NON AFRICAN AMERICAN: 65 mL/min/{1.73_m2} (ref >59)
Glucose: 87 mg/dL (ref 65–99)
Potassium: 5.3 mmol/L — ABNORMAL HIGH (ref 3.5–5.2)
SODIUM: 143 mmol/L (ref 134–144)

## 2018-03-31 ENCOUNTER — Other Ambulatory Visit: Payer: Self-pay

## 2018-03-31 DIAGNOSIS — I10 Essential (primary) hypertension: Secondary | ICD-10-CM

## 2018-03-31 DIAGNOSIS — I5022 Chronic systolic (congestive) heart failure: Secondary | ICD-10-CM

## 2018-04-05 ENCOUNTER — Other Ambulatory Visit: Payer: Medicare HMO | Admitting: *Deleted

## 2018-04-05 LAB — BASIC METABOLIC PANEL
BUN/Creatinine Ratio: 11 (ref 10–24)
BUN: 14 mg/dL (ref 8–27)
CALCIUM: 9.8 mg/dL (ref 8.6–10.2)
CHLORIDE: 106 mmol/L (ref 96–106)
CO2: 24 mmol/L (ref 20–29)
Creatinine, Ser: 1.29 mg/dL — ABNORMAL HIGH (ref 0.76–1.27)
GFR calc Af Amer: 63 mL/min/{1.73_m2} (ref >59)
GFR calc non Af Amer: 55 mL/min/{1.73_m2} — ABNORMAL LOW (ref >59)
Glucose: 86 mg/dL (ref 65–99)
POTASSIUM: 4.5 mmol/L (ref 3.5–5.2)
Sodium: 143 mmol/L (ref 134–144)

## 2018-04-06 NOTE — Progress Notes (Signed)
Creatinine trended up slightly but not much. The elevated potassium level has normalized.

## 2018-04-22 NOTE — Progress Notes (Signed)
[facility]  PATIENT: Nathan Perez DOB: [birth year]   REASON FOR VISIT:follow-up for stroke event September 2018 HISTORY FROM: Patient    HISTORY OF PRESENT ILLNESS: History Summary NathanNathan Perez a 71 y.o.malewith history of tobaccoabuse and no known other vascular risk factors admitted on 09/09/17 after 2 falls. MRI showed 4 punctate L MCA territory acute infarcts including the posterolateral left motor strip, and right cerebellum. MRA head and neck, DVT negative. However, EF 25-30% on TTE. Had cardiology consult and found pt had EKG in ER showed aflutter. LDL 88 and A1C 5.1. He was put on eliquis and lipitor. He was discharged in good condition with plan for cardioversion and TEE 4 weeks after discharge.  Interval History 11/26/19Dr. Roda Shutters During the interval time, the patient has been doing well. Still has mild right facial droop but stated that his slurry speech much improved. He followed with cardiology and had cardioversion on 10/19/17. However, TEE at the same time showed EF 15-20%. He is on cozaar and metoprolol. He complains of orthopnea at night. He has appointment with cardiology on 11/17/17. BP today in clinic 140/72.   UPDATE 5/13/2019CM Nathan Perez, 72 year old male returns for follow-up with history of stroke in September 2018.  He denies any new neurologic problem.  He remains on aspirin and Eliquis for secondary stroke prevention without recurrent stroke or TIA symptoms.  He has minimal bruising and no bleeding.  He remains on Lipitor without myalgia.  He is continuing to exercise by walking every day.  He also has a history of congestive heart failure and his Nathan Perez was recently increased.  He is due for echo in 3 months.  Blood pressure in the office today 154/59.  He said he has whitecoat syndrome.  He is back to most of his routine activity.  He continues to drive without difficulty.  He returns for reevaluation REVIEW OF SYSTEMS: Full 14 system  review of systems performed and notable only for those listed, all others are neg:  Constitutional: neg  Cardiovascular: neg Ear/Nose/Throat: neg  Skin: neg Eyes: neg Respiratory: neg Gastroitestinal: neg  Hematology/Lymphatic: neg  Endocrine: neg Musculoskeletal:neg Allergy/Immunology: neg Neurological: neg Psychiatric: neg Sleep : neg   ALLERGIES: No Known Allergies  HOME MEDICATIONS: Outpatient Medications Prior to Visit  Medication Sig Dispense Refill  . apixaban (ELIQUIS) 5 MG TABS tablet Take 1 tablet (5 mg total) by mouth 2 (two) times daily. 180 tablet 1  . aspirin EC 81 MG EC tablet Take 1 tablet (81 mg total) by mouth daily. 30 tablet 3  . atorvastatin (LIPITOR) 20 MG tablet Take 1 tablet (20 mg total) by mouth at bedtime. 30 tablet 4  . metoprolol succinate (TOPROL-XL) 25 MG 24 hr tablet Take 1 tablet (25 mg total) by mouth daily. 30 tablet 3  . sacubitril-valsartan (ENTRESTO) 97-103 MG Take 1 tablet by mouth 2 (two) times daily. 60 tablet 5   No facility-administered medications prior to visit.     PAST MEDICAL HISTORY: Past Medical History:  Diagnosis Date  . Atrial flutter (HCC)   . Hypertension   . Stroke [facility])     PAST SURGICAL HISTORY: Past Surgical History:  Procedure Laterality Date  . CARDIOVERSION N/A 10/19/2017   Procedure: CARDIOVERSION;  Surgeon: Thurmon Fair, MD;  Location: [facility] ENDOSCOPY;  Service: Cardiovascular;  Laterality: N/A;  . HERNIA REPAIR    . TEE WITHOUT CARDIOVERSION N/A 10/19/2017   Procedure: TRANSESOPHAGEAL ECHOCARDIOGRAM (TEE);  Surgeon: Thurmon Fair, MD;  Location: [facility]  ENDOSCOPY;  Service: Cardiovascular;  Laterality: N/A;    FAMILY HISTORY: Family History  Problem Relation Age of Onset  . Dementia Mother     SOCIAL HISTORY: Social History   Socioeconomic History  . Marital status: Married    Spouse name: Not on file  . Number of children: Not on file  . Years of education: Not on file  . Highest education  level: Not on file  Occupational History  . Not on file  Social Needs  . Financial resource strain: Not on file  . Food insecurity:    Worry: Not on file    Inability: Not on file  . Transportation needs:    Medical: Not on file    Non-medical: Not on file  Tobacco Use  . Smoking status: Former Smoker    Packs/day: 0.50    Types: Cigarettes    Last attempt to quit: 09/13/2017    Years since quitting: 0.6  . Smokeless tobacco: Never Used  Substance and Sexual Activity  . Alcohol use: Yes    Comment: quit drinking 07/2017  . Drug use: No  . Sexual activity: Not on file  Lifestyle  . Physical activity:    Days per week: Not on file    Minutes per session: Not on file  . Stress: Not on file  Relationships  . Social connections:    Talks on phone: Not on file    Gets together: Not on file    Attends religious service: Not on file    Active member of club or organization: Not on file    Attends meetings of clubs or organizations: Not on file    Relationship status: Not on file  . Intimate partner violence:    Fear of current or ex partner: Not on file    Emotionally abused: Not on file    Physically abused: Not on file    Forced sexual activity: Not on file  Other Topics Concern  . Not on file  Social History Narrative  . Not on file     PHYSICAL EXAM  Vitals:   04/25/18 1029  BP: (!) 154/59  Pulse: (!) 59  Weight: 157 lb (71.2 kg)  Height:  (1.854 m)   Body mass index is 20.71 kg/m.  Generalized: Well developed, in no acute distress  Head: normocephalic and atraumatic,. Oropharynx benign  Neck: Supple, no carotid bruits  Cardiac: Regular rate rhythm, no murmur  Musculoskeletal: No deformity   Neurological examination   Mentation: Alert oriented to time, place, history taking. Attention span and concentration appropriate. Recent and remote memory intact.  Follows all commands speech and language fluent.   Cranial nerve II-XII: .Pupils were equal  round reactive to light extraocular movements were full, visual field were full on confrontational test. Facial sensation and strength were normal. hearing was intact to finger rubbing bilaterally. Uvula tongue midline. head turning and shoulder shrug were normal and symmetric.Tongue protrusion into cheek strength was normal. Motor: normal bulk and tone, full strength in the BUE, BLE, Sensory: normal and symmetric to light touch, pinprick, and  Vibration, in the upper and lower extremity no assistive device Coordination: finger-nose-finger, heel-to-shin bilaterally, no dysmetria Reflexes: 1+ upper lower and symmetric, plantar responses were flexor bilaterally. Gait and Station: Rising up from seated position without assistance, normal stance,  moderate stride, good arm swing, smooth turning, able to perform tiptoe, and heel walking without difficulty. Tandem gait is steady.  No assistive device DIAGNOSTIC DATA (LABS, IMAGING,  TESTING) - I reviewed patient records, labs, notes, testing and imaging myself where available.  Lab Results  Component Value Date   WBC 5.3 10/18/2017   HGB 15.0 10/19/2017   HCT 44.0 10/19/2017   MCV 99 (H) 10/18/2017   PLT 276 10/18/2017      Component Value Date/Time   NA 143 04/05/2018 0000   K 4.5 04/05/2018 0000   CL 106 04/05/2018 0000   CO2 24 04/05/2018 0000   GLUCOSE 86 04/05/2018 0000   GLUCOSE 82 10/19/2017 1235   BUN 14 04/05/2018 0000   CREATININE 1.29 (H) 04/05/2018 0000   CALCIUM 9.8 04/05/2018 0000   PROT 7.3 09/08/2017 2008   ALBUMIN 4.0 09/08/2017 2008   AST 23 09/08/2017 2008   ALT 14 (L) 09/08/2017 2008   ALKPHOS 63 09/08/2017 2008   BILITOT 0.8 09/08/2017 2008   GFRNONAA 55 (L) 04/05/2018 0000   GFRAA 63 04/05/2018 0000   Lab Results  Component Value Date   CHOL 164 09/09/2017   HDL 64 09/09/2017   LDLCALC 88 09/09/2017   TRIG 60 09/09/2017   CHOLHDL 2.6 09/09/2017   Lab Results  Component Value Date   HGBA1C 5.1 09/09/2017      Lab Results  Component Value Date   TSH 3.672 09/09/2017      ASSESSMENT AND PLAN 72 y.o. African American male with PMH of smoker admitted on  09/09/17 after 2 falls. MRI showed 4 punctate L MCA territory acute infarcts including the posterolateral left motor strip, and right cerebellum. MRA head and neck, DVT negative. However, EF 25-30% on TTE. Had cardiology consult and found pt had EKG in ER showed aflutter. LDL 88 and A1C 5.1. He was put on eliquis and lipitor. He followed with cardiology and had cardioversion on 10/19/17. However, TEE at the same time showed EF 15-20%. He complains of orthopnea at night. Recent cardiology visit 03/17/18.  Entresto was increased at that time due to chronic combined systolic and diastolic congestive heart failure.  Plan:  Stressed the importance of management of risk factors to prevent further stroke Continue aspirin and Eliquis for secondary stroke preventionand at fib/flutter Maintain strict control of hypertension with blood pressure goal below 130/90,  continue antihypertensive medications Control of diabetes with hemoglobin A1c below 6.5 followed by primary care  Cholesterol with LDL cholesterol less than 70, followed by primary care, continue statin drug Lipitor Exercise by walking, recommend at least 30 min daily  eat healthy diet with whole grains,  fresh fruits and vegetables Follow-up with primary care for stroke risk factor modification, maintain blood pressure goal less than 130 systolic, diabetes with A1c below 7, lipids with LDL below 70 Continue Entresto for CHF follow up repeat echo in 3 months  Continue F/U with cardiology Discharge from stroke clinic I spent 25 minutes in total face to face time with the patient more than 50% of which was spent counseling and coordination of care, reviewing test results reviewing medications and discussing and reviewing the diagnosis of stroke and management of risk factors.  Patient was given a  written copy as well Nilda Riggs, [facility], [facility], APRN  [facility] [address], [address] [place], [place] [address] [phone]

## 2018-04-25 ENCOUNTER — Encounter: Payer: Self-pay | Admitting: Nurse Practitioner

## 2018-04-25 ENCOUNTER — Ambulatory Visit: Payer: Medicare HMO | Admitting: Nurse Practitioner

## 2018-04-25 VITALS — BP 154/59 | HR 59 | Ht 73.0 in | Wt 157.0 lb

## 2018-04-25 DIAGNOSIS — E785 Hyperlipidemia, unspecified: Secondary | ICD-10-CM | POA: Diagnosis not present

## 2018-04-25 DIAGNOSIS — I10 Essential (primary) hypertension: Secondary | ICD-10-CM

## 2018-04-25 DIAGNOSIS — Z8673 Personal history of transient ischemic attack (TIA), and cerebral infarction without residual deficits: Secondary | ICD-10-CM | POA: Diagnosis not present

## 2018-04-25 NOTE — Patient Instructions (Addendum)
Stressed the importance of management of risk factors to prevent further stroke Continue aspirin and Eliquis for secondary stroke preventionand at fib/flutter Maintain strict control of hypertension with blood pressure goal below 130/90,  continue antihypertensive medications Control of diabetes with hemoglobin A1c below 6.5 followed by primary care Cholesterol with LDL cholesterol less than 70, followed by primary care, continue statin drugLipitor Exercise by walking, recommend at least 30 min daily  eat healthy diet with whole grains,  fresh fruits and vegetables Follow-up with primary care for stroke risk factor modification, maintain blood pressure goal less than 130 systolic, diabetes with A1c below 7, lipids with LDL below 70 Continue Entresto for CHF follow up echo in 3 months  Continue F/U with cardiology Discharge from stroke clinic  Stroke Prevention Some health problems and behaviors may make it more likely for you to have a stroke. Below are ways to lessen your risk of having a stroke.  Be active for at least 30 minutes on most or all days.  Do not smoke. Try not to be around others who smoke.  Do not drink too much alcohol. ? Do not have more than 2 drinks a day if you are a man. ? Do not have more than 1 drink a day if you are a woman and are not pregnant.  Eat healthy foods, such as fruits and vegetables. If you were put on a specific diet, follow the diet as told.  Keep your cholesterol levels under control through diet and medicines. Look for foods that are low in saturated fat, trans fat, cholesterol, and are high in fiber.  If you have diabetes, follow all diet plans and take your medicine as told.  Ask your doctor if you need treatment to lower your blood pressure. If you have high blood pressure (hypertension), follow all diet plans and take your medicine as told by your doctor.  If you are 62-49 years old, have your blood pressure checked every 3-5 years. If you  are age 49 or older, have your blood pressure checked every year.  Keep a healthy weight. Eat foods that are low in calories, salt, saturated fat, trans fat, and cholesterol.  Do not take drugs.  Avoid birth control pills, if this applies. Talk to your doctor about the risks of taking birth control pills.  Talk to your doctor if you have sleep problems (sleep apnea).  Take all medicine as told by your doctor. ? You may be told to take aspirin or blood thinner medicine. Take this medicine as told by your doctor. ? Understand your medicine instructions.  Make sure any other conditions you have are being taken care of.  Get help right away if:  You suddenly lose feeling (you feel numb) or have weakness in your face, arm, or leg.  Your face or eyelid hangs down to one side.  You suddenly feel confused.  You have trouble talking (aphasia) or understanding what people are saying.  You suddenly have trouble seeing in one or both eyes.  You suddenly have trouble walking.  You are dizzy.  You lose your balance or your movements are clumsy (uncoordinated).  You suddenly have a very bad headache and you do not know the cause.  You have new chest pain.  Your heart feels like it is fluttering or skipping a beat (irregular heartbeat). Do not wait to see if the symptoms above go away. Get help right away. Call your local emergency services (911 in [place] Do not drive  yourself to the hospital. This information is not intended to replace advice given to you by your health care provider. Make sure you discuss any questions you have with your health care provider. Document Released: 05/31/2012 Document Revised: 05/07/2016 Document Reviewed: 06/02/2013 Elsevier Interactive Patient Education  [employer].

## 2018-04-27 NOTE — Progress Notes (Signed)
I reviewed above note and agree with the assessment and plan.   Marvel Plan, MD PhD Stroke Neurology 04/27/2018 12:51 PM

## 2018-05-31 ENCOUNTER — Telehealth: Payer: Self-pay | Admitting: Physician Assistant

## 2018-05-31 NOTE — Telephone Encounter (Signed)
Follow up  Pt also needs Entresto   *STAT* If patient is at the pharmacy, call can be transferred to refill team.   1. Which medications need to be refilled? (please list name of each medication and dose if known) sacubitril-valsartan (ENTRESTO) 97-103 MG  2. Which pharmacy/location (including street and city if local pharmacy) is medication to be sent to? [facility] [phone]#[id number] - [place], [place] - [address]  3. Do they need a 30 day or 90 day supply? 90

## 2018-05-31 NOTE — Telephone Encounter (Signed)
New Message    *STAT* If patient is at the pharmacy, call can be transferred to refill team.   1. Which medications need to be refilled? (please list name of each medication and dose if known) metoprolol succinate (TOPROL-XL) 25 MG 24 hr tablet  2. Which pharmacy/location (including street and city if local pharmacy) is medication to be sent to? [facility] [phone]#[id number] - [place], [place] - [address]  3. Do they need a 30 day or 90 day supply?  90

## 2018-06-01 MED ORDER — METOPROLOL SUCCINATE ER 25 MG PO TB24: 25.0000 mg | ORAL_TABLET | Freq: Every day | ORAL | 1 refills | Status: DC

## 2018-06-01 MED ORDER — SACUBITRIL-VALSARTAN 97-103 MG PO TABS: 1.0000 | ORAL_TABLET | Freq: Two times a day (BID) | ORAL | 1 refills | Status: DC

## 2018-06-01 NOTE — Addendum Note (Signed)
Addended by: Alyson InglesBROOME, Carles Florea L on: 06/01/2018 03:04 PM   Modules accepted: Orders

## 2018-06-20 ENCOUNTER — Ambulatory Visit (HOSPITAL_COMMUNITY): Payer: Medicare HMO | Attending: Cardiology

## 2018-06-27 ENCOUNTER — Other Ambulatory Visit: Payer: Self-pay

## 2018-06-27 ENCOUNTER — Ambulatory Visit (HOSPITAL_COMMUNITY): Payer: Medicare HMO | Attending: Cardiovascular Disease

## 2018-06-27 DIAGNOSIS — I110 Hypertensive heart disease with heart failure: Secondary | ICD-10-CM | POA: Insufficient documentation

## 2018-06-27 DIAGNOSIS — I5042 Chronic combined systolic (congestive) and diastolic (congestive) heart failure: Secondary | ICD-10-CM | POA: Diagnosis not present

## 2018-06-27 DIAGNOSIS — I4892 Unspecified atrial flutter: Secondary | ICD-10-CM | POA: Insufficient documentation

## 2018-06-27 DIAGNOSIS — Z8673 Personal history of transient ischemic attack (TIA), and cerebral infarction without residual deficits: Secondary | ICD-10-CM | POA: Diagnosis not present

## 2018-06-28 ENCOUNTER — Ambulatory Visit: Payer: Medicare HMO | Admitting: Cardiovascular Disease

## 2018-06-28 ENCOUNTER — Encounter: Payer: Self-pay | Admitting: Cardiology

## 2018-06-28 ENCOUNTER — Ambulatory Visit: Payer: Medicare HMO | Admitting: Cardiology

## 2018-06-28 VITALS — BP 162/74 | HR 65 | Ht 73.0 in | Wt 153.8 lb

## 2018-06-28 DIAGNOSIS — N183 Chronic kidney disease, stage 3 unspecified: Secondary | ICD-10-CM

## 2018-06-28 DIAGNOSIS — I4892 Unspecified atrial flutter: Secondary | ICD-10-CM

## 2018-06-28 DIAGNOSIS — Z7901 Long term (current) use of anticoagulants: Secondary | ICD-10-CM | POA: Diagnosis not present

## 2018-06-28 DIAGNOSIS — I10 Essential (primary) hypertension: Secondary | ICD-10-CM

## 2018-06-28 DIAGNOSIS — I429 Cardiomyopathy, unspecified: Secondary | ICD-10-CM | POA: Diagnosis not present

## 2018-06-28 DIAGNOSIS — I639 Cerebral infarction, unspecified: Secondary | ICD-10-CM

## 2018-06-28 DIAGNOSIS — Z79899 Other long term (current) drug therapy: Secondary | ICD-10-CM | POA: Diagnosis not present

## 2018-06-28 DIAGNOSIS — N289 Disorder of kidney and ureter, unspecified: Secondary | ICD-10-CM

## 2018-06-28 LAB — BASIC METABOLIC PANEL
BUN/Creatinine Ratio: 11 (ref 10–24)
BUN: 14 mg/dL (ref 8–27)
CO2: 23 mmol/L (ref 20–29)
Calcium: 10.2 mg/dL (ref 8.6–10.2)
Chloride: 103 mmol/L (ref 96–106)
Creatinine, Ser: 1.22 mg/dL (ref 0.76–1.27)
GFR calc Af Amer: 68 mL/min/{1.73_m2} (ref >59)
GFR calc non Af Amer: 58 mL/min/{1.73_m2} — ABNORMAL LOW (ref >59)
Glucose: 89 mg/dL (ref 65–99)
Potassium: 4.6 mmol/L (ref 3.5–5.2)
Sodium: 142 mmol/L (ref 134–144)

## 2018-06-28 MED ORDER — METOPROLOL SUCCINATE ER 25 MG PO TB24: 25.0000 mg | ORAL_TABLET | Freq: Every day | ORAL | 3 refills | Status: DC

## 2018-06-28 MED ORDER — SACUBITRIL-VALSARTAN 97-103 MG PO TABS: 1.0000 | ORAL_TABLET | Freq: Two times a day (BID) | ORAL | 3 refills | Status: DC

## 2018-06-28 NOTE — Assessment & Plan Note (Signed)
B/P elevated today- he has not taken his medications yet today. I would re check his B/P in a week with the pharmacist. Consider addition of low dose diuretic or possibly Amlodipine if needed.

## 2018-06-28 NOTE — Assessment & Plan Note (Signed)
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

## 2018-06-28 NOTE — Progress Notes (Signed)
06/28/2018 Nathan Perez   12/01/1946  [id number]  Primary Physician [facility], [facility] Primary Cardiologist: Dr Duke Salviaandolph  HPI:  72 y/o AA male admitted 09/09/17 after a syncopal spell at home. Pt had syncope after mowing his lawn. In the ED he was noted to facial droop. MRI revealed multiple embolic CVA. His EKG was noted to be atrial flutter with CVR.  An echo showed his EF to be 25-30%, LA upper limits of normal.  A Myoview showed AS ischemia and areas of scar. He was not felt to be a candidate for cath secondary to his recent stroke. He was placed on Eliquis and had an OP TEE CV 10/19/17, his EF was 15% at cardioversion.  Nathan Perez was started in Dec 2018 and titrated up over the next several months.  He had an echo done 06/27/18 and is here today for follow up. Remarkably his EF is now 60-65% with no WMA. He remains asymptomatic and is tolerating his medications well.     Current Outpatient Medications  Medication Sig Dispense Refill  . apixaban (ELIQUIS) 5 MG TABS tablet Take 1 tablet (5 mg total) by mouth 2 (two) times daily. 180 tablet 1  . aspirin EC 81 MG EC tablet Take 1 tablet (81 mg total) by mouth daily. 30 tablet 3  . atorvastatin (LIPITOR) 20 MG tablet Take 1 tablet (20 mg total) by mouth at bedtime. 30 tablet 4  . metoprolol succinate (TOPROL-XL) 25 MG 24 hr tablet Take 1 tablet (25 mg total) by mouth daily. 30 tablet 3  . sacubitril-valsartan (ENTRESTO) 97-103 MG Take 1 tablet by mouth 2 (two) times daily. 60 tablet 3   No current facility-administered medications for this visit.     No Known Allergies  Past Medical History:  Diagnosis Date  . Atrial flutter (HCC)   . Hypertension   . Stroke [facility](HCC)     Social History   Socioeconomic History  . Marital status: Married    Spouse name: Not on file  . Number of children: Not on file  . Years of education: Not on file  . Highest education level: Not on file  Occupational History  . Not on file  Social  Needs  . Financial resource strain: Not on file  . Food insecurity:    Worry: Not on file    Inability: Not on file  . Transportation needs:    Medical: Not on file    Non-medical: Not on file  Tobacco Use  . Smoking status: Former Smoker    Packs/day: 0.50    Types: Cigarettes    Last attempt to quit: 09/13/2017    Years since quitting: 0.7  . Smokeless tobacco: Never Used  Substance and Sexual Activity  . Alcohol use: Yes    Comment: quit drinking 07/2017  . Drug use: No  . Sexual activity: Not on file  Lifestyle  . Physical activity:    Days per week: Not on file    Minutes per session: Not on file  . Stress: Not on file  Relationships  . Social connections:    Talks on phone: Not on file    Gets together: Not on file    Attends religious service: Not on file    Active member of club or organization: Not on file    Attends meetings of clubs or organizations: Not on file    Relationship status: Not on file  . Intimate partner violence:    Fear  of current or ex partner: Not on file    Emotionally abused: Not on file    Physically abused: Not on file    Forced sexual activity: Not on file  Other Topics Concern  . Not on file  Social History Narrative  . Not on file     Family History  Problem Relation Age of Onset  . Dementia Mother      Review of Systems: General: negative for chills, fever, night sweats or weight changes.  Cardiovascular: negative for chest pain, dyspnea on exertion, edema, orthopnea, palpitations, paroxysmal nocturnal dyspnea or shortness of breath Dermatological: negative for rash Respiratory: negative for cough or wheezing Urologic: negative for hematuria Abdominal: negative for nausea, vomiting, diarrhea, bright red blood per rectum, melena, or hematemesis Neurologic: negative for visual changes, syncope, or dizziness All other systems reviewed and are otherwise negative except as noted above.    Blood pressure (!) 162/74, pulse 65,  height 6\' 1"  (1.854 m), weight 153 lb 12.8 oz (69.8 kg), SpO2 95 %.  General appearance: alert, cooperative and no distress Neck: no carotid bruit and no JVD Lungs: clear to auscultation bilaterally Heart: regular rate and rhythm Extremities: extremities normal, atraumatic, no cyanosis or edema Skin: Skin color, texture, turgor normal. No rashes or lesions Neurologic: Grossly normal    ASSESSMENT AND PLAN:   Cardiomyopathy (HCC) EF 15% Nov 2018- Etiology not yet determined but Myoview was abnormal suggesting ischemia- not cathed then secondary to recent stroke.  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 I would re check his B/P in a week with the pharmacist. Consider addition of low dose diuretic or possibly Amlodipine if needed.    PLAN  Check BMP, f/u B/P in one week, F/U Dr Duke Salvia in 3 months.   Corine Shelter PA-C 06/28/2018 12:58 PM

## 2018-06-28 NOTE — Patient Instructions (Signed)
Medication Instructions:  Your physician recommends that you continue on your current medications as directed. Please refer to the Current Medication list given to you today.  Labwork: Today (BMET)  Follow-Up: 1 week for blood pressure check with pharmacist  3 months with Dr. Duke Salviaandolph  Any Other Special Instructions Will Be Listed Below (If Applicable).     If you need a refill on your cardiac medications before your next appointment, please call your pharmacy.

## 2018-06-28 NOTE — Assessment & Plan Note (Signed)
Pt found to be in atrial flutter when admitted with embolic CVA 09/09/17 S/P TEE CV to NSR Nov 2018

## 2018-06-28 NOTE — Assessment & Plan Note (Signed)
EF 15% Nov 2018- Etiology not yet determined but Myoview was abnormal suggesting ischemia- not cathed then secondary to recent stroke.  EF in July 2019- 60-65% with no WMA

## 2018-06-28 NOTE — Assessment & Plan Note (Signed)
Check BMP today 

## 2018-06-28 NOTE — Assessment & Plan Note (Signed)
CHADS VASC=5- on Eliquis 

## 2018-07-05 ENCOUNTER — Ambulatory Visit (INDEPENDENT_AMBULATORY_CARE_PROVIDER_SITE_OTHER): Payer: Medicare HMO | Admitting: Pharmacist Clinician (PhC)/ Clinical Pharmacy Specialist

## 2018-07-05 ENCOUNTER — Encounter: Payer: Self-pay | Admitting: Pharmacist Clinician (PhC)/ Clinical Pharmacy Specialist

## 2018-07-05 VITALS — BP 142/68 | HR 56

## 2018-07-05 DIAGNOSIS — I10 Essential (primary) hypertension: Secondary | ICD-10-CM

## 2018-07-05 MED ORDER — CHLORTHALIDONE 25 MG PO TABS: 12.5000 mg | ORAL_TABLET | Freq: Every day | ORAL | 3 refills | Status: DC

## 2018-07-05 NOTE — Patient Instructions (Signed)
Return for a a follow up appointment in 3 weeks  Your blood pressure today is 142/68 (goal is < 130/80)   Check your blood pressure at home daily and keep record of the readings.  Take your BP meds as follows:  Start chlorthalidone 12.5 mg (1/2 of 25 mg tablet) once daily with your morning medications  Continue with all other medications  Bring all of your meds, your BP cuff and your record of home blood pressures to your next appointment.  Exercise as you're able, try to walk approximately 30 minutes per day.  Keep salt intake to a minimum, especially watch canned and prepared boxed foods.  Eat more fresh fruits and vegetables and fewer canned items.  Avoid eating in fast food restaurants.    HOW TO TAKE YOUR BLOOD PRESSURE: . Rest 5 minutes before taking your blood pressure. .  Don't smoke or drink caffeinated beverages for at least 30 minutes before. . Take your blood pressure before (not after) you eat. . Sit comfortably with your back supported and both feet on the floor (don't cross your legs). . Elevate your arm to heart level on a table or a desk. . Use the proper sized cuff. It should fit smoothly and snugly around your bare upper arm. There should be enough room to slip a fingertip under the cuff. The bottom edge of the cuff should be 1 inch above the crease of the elbow. . Ideally, take 3 measurements at one sitting and record the average.

## 2018-07-05 NOTE — Progress Notes (Signed)
07/05/2018 Angelina PihJimmy L Duplantis 08/10/1946 [id number]   HPI:  Angelina PihJimmy L Sedita is a 72 y.o. male patient of Dr Duke Salviaandolph, with a PMH below who presents today for hypertension clinic evaluation.  In addition to hypertension, her medical history is significant for multiple embolic CVA, atrial flutter,  HFrEF, renal insufficiency and tobacco abuse.  He saw Corine ShelterLuke Kilroy last week and was noted to have a BP of 162/74.  He stated that he had not yet taken his medications that day, so was asked to return today for follow up.    He is here today with no specific complaints.  Some arthritis in hands/wrist but otherwise feeling well.  He has not yet taken his morning medications, but when questioned, he notes that he usually takes them between 11-12 in the morning, then about the same time late at night.  He has no issues with the cost of his Entresto or Eliquis.    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); eats soups and salads; has been diluting his soup (chicken noodle) with water to decrease sodium     Exercise:  Has home weights, lifts up to 80 pounds- usually twice daily; mows lawn (about 1 hour) and walks some   Home BP readings:  Has home cuff; 154/70 on Saturday, can't recall any other readings, did not write them down  Intolerances:   NKDA  Labs:    Na 142, K 4.6, Glu 89, BUN 14, SCr 1.22  CrCl 53.2  Wt Readings from Last 3 Encounters:  06/28/18 153 lb 12.8 oz (69.8 kg)  04/25/18 157 lb (71.2 kg)  03/17/18 157 lb (71.2 kg)   BP Readings from Last 3 Encounters:  07/05/18 (!) 142/68  06/28/18  (!) 162/74  04/25/18 (!) 154/59   Pulse Readings from Last 3 Encounters:  07/05/18 (!) 56  06/28/18 65  04/25/18 (!) 59    Current Outpatient Medications  Medication Sig Dispense Refill  . apixaban (ELIQUIS) 5 MG TABS tablet Take 1 tablet (5 mg total) by mouth 2 (two) times daily. 180 tablet 1  . aspirin EC 81 MG EC tablet Take 1 tablet (81 mg total) by mouth daily. 30 tablet 3  . atorvastatin (LIPITOR) 20 MG tablet Take 1 tablet (20 mg total) by mouth at bedtime. 30 tablet 4  . chlorthalidone (HYGROTON) 25 MG tablet Take 0.5 tablets (12.5 mg total) by mouth daily. 15 tablet 3  . metoprolol succinate (TOPROL-XL) 25 MG 24 hr tablet Take 1 tablet (25 mg total) by mouth daily. 30 tablet 3  . sacubitril-valsartan (ENTRESTO) 97-103 MG Take 1 tablet by mouth 2 (two) times daily. 60 tablet 3   No current facility-administered medications for this visit.     No Known Allergies  Past Medical History:  Diagnosis Date  . Atrial flutter (HCC)   . Hypertension   . Stroke (HCC)     Blood pressure (!) 142/68, pulse (!) 56.  Essential hypertension Patient with essential hypertension, not currently controlled with medication.  Will add  chlorthalidone 12.5 mg once daily with his morning medications.  He will return in 2-3 weeks for follow up.  At that time we will get a BMET to ensure his kidney function is stable.  He was asked to write down his home BP readings on a log and bring that, along with his home cuff when he returns.      Phillips Hay PharmD CPP [facility] [address] [place], [place] [address] [phone]

## 2018-07-05 NOTE — Assessment & Plan Note (Signed)
Patient with essential hypertension, not currently controlled with medication.  Will add chlorthalidone 12.5 mg once daily with his morning medications.  He will return in 2-3 weeks for follow up.  At that time we will get a BMET to ensure his kidney function is stable.  He was asked to write down his home BP readings on a log and bring that, along with his home cuff when he returns.

## 2018-07-21 ENCOUNTER — Ambulatory Visit (INDEPENDENT_AMBULATORY_CARE_PROVIDER_SITE_OTHER): Payer: Medicare HMO | Admitting: Pharmacist Clinician (PhC)/ Clinical Pharmacy Specialist

## 2018-07-21 DIAGNOSIS — I10 Essential (primary) hypertension: Secondary | ICD-10-CM

## 2018-07-21 LAB — BASIC METABOLIC PANEL
BUN/Creatinine Ratio: 16 (ref 10–24)
BUN: 22 mg/dL (ref 8–27)
CALCIUM: 9.8 mg/dL (ref 8.6–10.2)
CO2: 23 mmol/L (ref 20–29)
CREATININE: 1.34 mg/dL — AB (ref 0.76–1.27)
Chloride: 101 mmol/L (ref 96–106)
GFR calc Af Amer: 60 mL/min/{1.73_m2} (ref >59)
GFR calc non Af Amer: 52 mL/min/{1.73_m2} — ABNORMAL LOW (ref >59)
GLUCOSE: 85 mg/dL (ref 65–99)
Potassium: 4.4 mmol/L (ref 3.5–5.2)
Sodium: 141 mmol/L (ref 134–144)

## 2018-07-21 NOTE — Patient Instructions (Signed)
Return for a a follow up appointment with Dr. Duke Salviaandolph in October  Your blood pressure today is 132/64  Check your blood pressure at home several times each week and keep record of the readings.  Take your BP meds as follows:  Continue with all your current medications  Get lab work done today  Bring all of your meds, your BP cuff and your record of home blood pressures to your next appointment.  Exercise as you're able, try to walk approximately 30 minutes per day.  Keep salt intake to a minimum, especially watch canned and prepared boxed foods.  Eat more fresh fruits and vegetables and fewer canned items.  Avoid eating in fast food restaurants.    HOW TO TAKE YOUR BLOOD PRESSURE: . Rest 5 minutes before taking your blood pressure. .  Don't smoke or drink caffeinated beverages for at least 30 minutes before. . Take your blood pressure before (not after) you eat. . Sit comfortably with your back supported and both feet on the floor (don't cross your legs). . Elevate your arm to heart level on a table or a desk. . Use the proper sized cuff. It should fit smoothly and snugly around your bare upper arm. There should be enough room to slip a fingertip under the cuff. The bottom edge of the cuff should be 1 inch above the crease of the elbow. . Ideally, take 3 measurements at one sitting and record the average.

## 2018-07-21 NOTE — Progress Notes (Signed)
07/21/2018 Nathan Perez 12-May-1946 [id number]   HPI:  Nathan Perez is a 72 y.o. male patient of Dr Duke Salvia, with a PMH below who presents today for hypertension clinic evaluation.  In addition to hypertension, her medical history is significant for multiple embolic CVA, atrial flutter,  HFrEF, renal insufficiency and tobacco abuse.  He saw Corine Shelter last week and was noted to have a BP of 162/74.  He stated that he had not yet taken his medications that day, so was asked to follow up with CVRR.   At that time his BP was still elevated, so he was started on chlorthalidone 12.5 mg daily.  He was to get a BMET after two weeks.     He returns today with no specific complaints.   He did not get BMET as asked.   He has been out of his atorvastatin and Eliquis for several days, has not made it to the pharmacy to pick up refills.    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  All home readings were done with incorrect position, which read about 20 points higher than the correct position.   His average was 140/88, so was probably closer to 120-130 systolic  Intolerances:   NKDA  Labs:    06/28/18:  Na 142, K 4.6, Glu 89, BUN 14, SCr 1.22  CrCl 53.2  Wt Readings from Last 3  Encounters:  06/28/18 153 lb 12.8 oz (69.8 kg)  04/25/18 157 lb (71.2 kg)  03/17/18 157 lb (71.2 kg)   BP Readings from Last 3 Encounters:  07/21/18 132/64  07/05/18 (!) 142/68  06/28/18 (!) 162/74   Pulse Readings from Last 3 Encounters:  07/21/18 60  07/05/18 (!) 56  06/28/18 65    Current Outpatient Medications  Medication Sig Dispense Refill  . apixaban (ELIQUIS) 5 MG TABS tablet Take 1 tablet (5 mg total) by mouth 2 (two) times daily. 180 tablet 1  . aspirin EC 81 MG EC tablet Take 1 tablet (81 mg total) by mouth daily. 30 tablet 3  . atorvastatin (LIPITOR) 20 MG tablet Take 1 tablet (20 mg total) by mouth at bedtime. 30 tablet 4  . chlorthalidone (HYGROTON) 25 MG tablet Take 0.5 tablets (12.5 mg total) by mouth daily. 15 tablet 3  . metoprolol succinate (TOPROL-XL) 25 MG 24 hr tablet Take 1 tablet (25 mg total) by mouth daily. 30 tablet 3  . sacubitril-valsartan (ENTRESTO) 97-103 MG Take 1 tablet by mouth 2 (two) times daily. 60 tablet 3   No current facility-administered medications for this visit.     No Known Allergies  Past Medical History:  Diagnosis Date  . Atrial flutter (HCC)   . Hypertension   . Stroke (HCC)     Blood pressure 132/64, pulse 60.  Essential hypertension Patient with essential hypertension doing much better with the addition of chlorthalidone 12.5 mg daily.   Will repeat labs today to ensure that he is tolerating it well.   No changes to medications today.  He is to continue with regular home monitoring (correct cuff technique was stressed) and is due to see Dr. Duke Salviaandolph again in October.  Should he have any problems prior to that he can call for guidance.   Will repeat BMET while patient is in office today   Phillips HayKristin Alvstad PharmD CPP [facility] [address] [place], [place] [id number] [phone]

## 2018-07-21 NOTE — Assessment & Plan Note (Signed)
Patient with essential hypertension doing much better with the addition of chlorthalidone 12.5 mg daily.   Will repeat labs today to ensure that he is tolerating it well.   No changes to medications today.  He is to continue with regular home monitoring (correct cuff technique was stressed) and is due to see Dr. Duke Salviaandolph again in October.  Should he have any problems prior to that he can call for guidance.   Will repeat BMET while patient is in office today

## 2018-08-18 ENCOUNTER — Telehealth: Payer: Self-pay | Admitting: *Deleted

## 2018-08-18 DIAGNOSIS — N289 Disorder of kidney and ureter, unspecified: Secondary | ICD-10-CM

## 2018-08-18 NOTE — Telephone Encounter (Signed)
-----   Message from Chilton Si, MD sent at 08/11/2018  5:29 AM EDT ----- Kidney function is slightly worse but this changes minimal.  Recommend repeating a basic metabolic panel in 1 month.

## 2018-08-18 NOTE — Telephone Encounter (Signed)
Unable to reach patient, called wife and advised her. Mailed lab form to recheck in 1 month

## 2018-09-12 ENCOUNTER — Encounter (INDEPENDENT_AMBULATORY_CARE_PROVIDER_SITE_OTHER): Payer: Medicare HMO | Admitting: Ophthalmology

## 2018-09-30 ENCOUNTER — Ambulatory Visit: Payer: Medicare HMO | Admitting: Cardiovascular Disease

## 2018-09-30 ENCOUNTER — Encounter: Payer: Self-pay | Admitting: Cardiovascular Disease

## 2018-09-30 VITALS — BP 134/62 | HR 57 | Ht 73.0 in | Wt 153.0 lb

## 2018-09-30 DIAGNOSIS — E785 Hyperlipidemia, unspecified: Secondary | ICD-10-CM | POA: Diagnosis not present

## 2018-09-30 DIAGNOSIS — I10 Essential (primary) hypertension: Secondary | ICD-10-CM

## 2018-09-30 DIAGNOSIS — Z5181 Encounter for therapeutic drug level monitoring: Secondary | ICD-10-CM

## 2018-09-30 DIAGNOSIS — I4892 Unspecified atrial flutter: Secondary | ICD-10-CM

## 2018-09-30 MED ORDER — SACUBITRIL-VALSARTAN 97-103 MG PO TABS: 1.0000 | ORAL_TABLET | Freq: Two times a day (BID) | ORAL | 3 refills | Status: DC

## 2018-09-30 MED ORDER — APIXABAN 5 MG PO TABS: 5.0000 mg | ORAL_TABLET | Freq: Two times a day (BID) | ORAL | 3 refills | Status: DC

## 2018-09-30 MED ORDER — METOPROLOL SUCCINATE ER 25 MG PO TB24: 25.0000 mg | ORAL_TABLET | Freq: Every day | ORAL | 3 refills | Status: DC

## 2018-09-30 MED ORDER — CHLORTHALIDONE 25 MG PO TABS: 12.5000 mg | ORAL_TABLET | Freq: Every day | ORAL | 3 refills | Status: DC

## 2018-09-30 NOTE — Progress Notes (Signed)
Cardiology Office Note   Date:  09/30/2018   ID:  Nathan Perez, DOB 20-Aug-1946, MRN [id number]  PCP:  [facility]  Cardiologist:   Chilton Si, MD   Chief Complaint  Patient presents with  . Follow-up    3 months     History of Present Illness: Nathan Perez is a 72 y.o. male with chronic systolic and diastolic heart failure, atherosclerosis of the aorta, hypertension, hyperlipidemia, TIA, stroke, atrial flutter, and prior tobacco abuse here for follow-up.  Nathan Perez was seen in the hospital 08/2017 for recurrent falls.  He reported new onset of gait imbalance and falls.  He noted weakness on the right side of his body.    In the ED he was also noted to have a R facial droop and mild slurred speech.  He had a head CT that showed mild chronic microvascular ischemic changes and parenchymal volume loss.  He did not receive t-PA due to the chronicity of his symptoms.  MRI of the brain showed several small, acute infarcts in the posterior L MCA territory.  There were also possible cerebellar infarcts.  Carotids were unremarkable on MRA.  The TEE schedule was unable to accomodate him today so there were plans to discharge him for outpatient TEE and loop recorder.   An echocardiogram was ordered which showed LVEF 25-30% with severe, diffuse hypokinesis. Right ventricular function was also apparently reduced.  EKG in the ED showed atrial flutter with variable ventricular response.  Cardiology was consulted due to newly diagnosed systolic heart failure.  He had a [employer] 09/11/17 that revealed LVEF 26%.  There was also concern for prior inferior infarct and anterior and anteroseptal ischemia.  Given the fact that he had no chest pain in his recent stroke this was deferred to the outpatient setting.  He underwent TEE 10/19/17 that showed LVEF 15% and no evidence of left atrial thrombus.  He had a successful cardioversion and remains in sinus rhythm.  Nathan Perez quit  smoking 09/2017.  At his last appointment Nathan Perez' BP was elevated.  He also felt short of breath.  Losartan was switched to [facility].  He followed up with Nathan Shelter, PA, and his blood pressure remained elevated.  However he had not taken his medication at that appointment.  He then saw our pharmacist and chlorthalidone was added to his regimen.  Since that time he reports his blood pressure has been well-controlled.  It is mostly in the 110s to 130s.  His breathing has been okay.  Lately he has been suffering from mild bronchitis.  He has no chest pain or pressure.  He exercises by walking and lifting weights daily.  He has no exertional chest pain or shortness of breath.  He denies lower extremity edema, orthopnea, or PND.   Past Medical History:  Diagnosis Date  . Atrial flutter (HCC)   . Hypertension   . Stroke [facility])     Past Surgical History:  Procedure Laterality Date  . CARDIOVERSION N/A 10/19/2017   Procedure: CARDIOVERSION;  Surgeon: Thurmon Fair, MD;  Location: [facility] ENDOSCOPY;  Service: Cardiovascular;  Laterality: N/A;  . HERNIA REPAIR    . TEE WITHOUT CARDIOVERSION N/A 10/19/2017   Procedure: TRANSESOPHAGEAL ECHOCARDIOGRAM (TEE);  Surgeon: Thurmon Fair, MD;  Location[facility] ENDOSCOPY;  Service: Cardiovascular;  Laterality: N/A;     Current Outpatient Medications  Medication Sig Dispense Refill  . apixaban (ELIQUIS) 5 MG TABS tablet Take 1 tablet (5  mg total) by mouth 2 (two) times daily. 180 tablet 3  . aspirin EC 81 MG EC tablet Take 1 tablet (81 mg total) by mouth daily. 30 tablet 3  . atorvastatin (LIPITOR) 20 MG tablet Take 1 tablet (20 mg total) by mouth at bedtime. 30 tablet 4  . chlorthalidone (HYGROTON) 25 MG tablet Take 0.5 tablets (12.5 mg total) by mouth daily. 45 tablet 3  . metoprolol succinate (TOPROL-XL) 25 MG 24 hr tablet Take 1 tablet (25 mg total) by mouth daily. 90 tablet 3  . sacubitril-valsartan (ENTRESTO) 97-103 MG Take 1 tablet by mouth 2 (two)  times daily. 180 tablet 3   No current facility-administered medications for this visit.     Allergies:   Patient has no known allergies.    Social History:  The patient  reports that he quit smoking about 12 months ago. His smoking use included cigarettes. He smoked 0.50 packs per day. He has never used smokeless tobacco. He reports that he drinks alcohol. He reports that he does not use drugs.   Family History:  The patient's family history includes Nathan Perez in his mother.    ROS:  Please see the history of present illness.   Otherwise, review of systems are positive for neck stiffness.   All other systems are reviewed and negative.    PHYSICAL EXAM: VS:  BP 134/62 (BP Location: Left Arm, Patient Position: Sitting, Cuff Size: Normal)   Pulse (!) 57   Ht 6\' 1"  (1.854 m)   Wt 153 lb (69.4 kg)   BMI 20.19 kg/m  , BMI Body mass index is 20.19 kg/m. GENERAL:  Well appearing HEENT: Pupils equal round and reactive, fundi not visualized, oral mucosa unremarkable NECK:  No jugular venous distention, waveform within normal limits, carotid upstroke brisk and symmetric, no bruits LUNGS:  Clear to auscultation bilaterally HEART:  RRR.  PMI not displaced or sustained,S1 and S2 within normal limits, no S3, no S4, no clicks, no rubs, no murmurs ABD:  Flat, positive bowel sounds normal in frequency in pitch, no bruits, no rebound, no guarding, no midline pulsatile mass, no hepatomegaly, no splenomegaly EXT:  2 plus pulses throughout, no edema, no cyanosis no clubbing SKIN:  No rashes no nodules NEURO:  Cranial nerves II through XII grossly intact, motor grossly intact throughout PSYCH:  Cognitively intact, oriented to person place and time   EKG:  EKG is ordered today. The ekg ordered 11/17/17 demonstrates sinus rhythm.  Rate 60 bpm  LVH with secondary repolarization abnormality.  09/30/18: Sinus bradycardia.  Rate 57 bpm.     Recent Labs: 10/18/2017: Platelets 276 10/19/2017: Hemoglobin  15.0 07/21/2018: BUN 22; Creatinine, Ser 1.34; Potassium 4.4; Sodium 141    Lipid Panel    Component Value Date/Time   CHOL 164 09/09/2017 0925   TRIG 60 09/09/2017 0925   HDL 64 09/09/2017 0925   CHOLHDL 2.6 09/09/2017 0925   VLDL 12 09/09/2017 0925   LDLCALC 88 09/09/2017 0925      Wt Readings from Last 3 Encounters:  09/30/18 153 lb (69.4 kg)  06/28/18 153 lb 12.8 oz (69.8 kg)  04/25/18 157 lb (71.2 kg)      ASSESSMENT AND PLAN:  # Chronic systolic and diastolic heart failure:  # Hypertension:   LVEF improved from 15-20%  10/2017 to 60-65% 06/2018.  He is euvolemic and doing well.  Continue metoprolol, Entresto and chlorthalidone.  BP much better controlled.   # Persistent atrial fibrillation: Mr. Gowdy remains in  sinus rhythm after DCCV.  Continue metoprolol and Eliquis.  # Hyperlipidemia: LDL 88 08/2017.  Continue atorvastatin.  Check lipids/CMP.   # Prior stroke: On aspirin and atorvastatin.    # Tobacco abuse: Patient quit smoking 09/2017.   Current medicines are reviewed at length with the patient today.  The patient does not have concerns regarding medicines.  The following changes have been made:  none  Labs/ tests ordered today include:   Orders Placed This Encounter  Procedures  . Lipid panel  . Comprehensive metabolic panel  . EKG 12-Lead     Disposition:   FU with Elenie Coven C. Duke Salvia, MD, [facility] in 1 year.     Signed, Brandyce Dimario C. Duke Salvia, MD, [facility]  09/30/2018 12:32 PM    [facility]

## 2018-09-30 NOTE — Patient Instructions (Addendum)
Medication Instructions:  Your physician recommends that you continue on your current medications as directed. Please refer to the Current Medication list given to you today.  If you need a refill on your cardiac medications before your next appointment, please call your pharmacy.   Lab work: LP/CMET   Testing/Procedures: NONE   Follow-Up: At [facility], you and your health needs are our priority.  As part of our continuing mission to provide you with exceptional heart care, we have created designated Provider Care Teams.  These Care Teams include your primary Cardiologist (physician) and Advanced Practice Providers (APPs -  Physician Assistants and Nurse Practitioners) who all work together to provide you with the care you need, when you need it. You will need a follow up appointment in 12 months.  Please call our office 2 months in advance to schedule this appointment.  You may see DR [facility] or one of the following Advanced Practice Providers on your designated Care Team:   Corine Shelter, PA-C Judy Pimple, [place] . Marjie Skiff, PA-C

## 2018-10-05 LAB — LIPID PANEL
CHOL/HDL RATIO: 2.4 ratio (ref 0.0–5.0)
Cholesterol, Total: 148 mg/dL (ref 100–199)
HDL: 62 mg/dL (ref >39)
LDL Calculated: 62 mg/dL (ref 0–99)
Triglycerides: 118 mg/dL (ref 0–149)
VLDL Cholesterol Cal: 24 mg/dL (ref 5–40)

## 2018-10-05 LAB — COMPREHENSIVE METABOLIC PANEL
A/G RATIO: 1.8 (ref 1.2–2.2)
ALT: 10 IU/L (ref 0–44)
AST: 16 IU/L (ref 0–40)
Albumin: 4.8 g/dL (ref 3.5–4.8)
Alkaline Phosphatase: 53 IU/L (ref 39–117)
BILIRUBIN TOTAL: 0.6 mg/dL (ref 0.0–1.2)
BUN/Creatinine Ratio: 12 (ref 10–24)
BUN: 15 mg/dL (ref 8–27)
CALCIUM: 9.7 mg/dL (ref 8.6–10.2)
CHLORIDE: 101 mmol/L (ref 96–106)
CO2: 23 mmol/L (ref 20–29)
Creatinine, Ser: 1.24 mg/dL (ref 0.76–1.27)
GFR, EST AFRICAN AMERICAN: 66 mL/min/{1.73_m2} (ref >59)
GFR, EST NON AFRICAN AMERICAN: 57 mL/min/{1.73_m2} — AB (ref >59)
GLUCOSE: 95 mg/dL (ref 65–99)
Globulin, Total: 2.6 g/dL (ref 1.5–4.5)
POTASSIUM: 4.5 mmol/L (ref 3.5–5.2)
Sodium: 140 mmol/L (ref 134–144)
TOTAL PROTEIN: 7.4 g/dL (ref 6.0–8.5)

## 2018-11-21 NOTE — Progress Notes (Addendum)
[facility] Note  11/22/2018     CHIEF COMPLAINT Patient presents for Retina Evaluation   HISTORY OF PRESENT ILLNESS: Nathan Perez is a 72 y.o. male who presents to the clinic today for:   HPI    Retina Evaluation    In left eye.  This started 2 years ago.  Duration of 2 years.  Associated Symptoms Flashes, Floaters, Glare and Photophobia.  Context:  distance vision, mid-range vision and near vision.  Treatments tried include artificial tears.  Response to treatment was no improvement.  I, the attending physician,  performed the HPI with the patient and updated documentation appropriately.          Comments    72 y/o male pt referred by Dr. Rexene Edison for eval of partial ret hole OS.  VA blurred OS x 2 yrs.  C/o ocassional eye pain, temporal flashes and floaters OU.  VA good OD.  AT prn OU.       Last edited by Bernarda Caffey, MD on 11/22/2018 11:55 PM. (History)    Pt saw Shirleen Schirmer at [facility] back in September and was referred here, but pts sister had a massive heart attack in [place], pt states he has been spending a lot of time with her, pt states Gwenlyn Perking wants to set up cataract sx, but needs clearance first, pt states he had a stroke in the back of his head  Referring physician: Shirleen Schirmer, PA-C No address on file  HISTORICAL INFORMATION:   Selected notes from the [facility] Referred by Shirleen Schirmer, PA for cataract clearance/partial retinal hole LEE: 12.09.19 (A. Lundquist) [BCVA: OD: OS:] Ocular Hx- PMH-atrial flutter, HTN, stroke    CURRENT MEDICATIONS: No current outpatient medications on file. (Ophthalmic Drugs)   No current facility-administered medications for this visit.  (Ophthalmic Drugs)   Current Outpatient Medications (Other)  Medication Sig  . apixaban (ELIQUIS) 5 MG TABS tablet Take 1 tablet (5 mg total) by mouth 2 (two) times daily.  Marland Kitchen aspirin EC 81 MG EC tablet Take 1 tablet (81 mg  total) by mouth daily.  Marland Kitchen atorvastatin (LIPITOR) 20 MG tablet Take 1 tablet (20 mg total) by mouth at bedtime.  . chlorthalidone (HYGROTON) 25 MG tablet Take 0.5 tablets (12.5 mg total) by mouth daily.  . metoprolol succinate (TOPROL-XL) 25 MG 24 hr tablet Take 1 tablet (25 mg total) by mouth daily.  . sacubitril-valsartan (ENTRESTO) 97-103 MG Take 1 tablet by mouth 2 (two) times daily.  Marland Kitchen SPIRIVA RESPIMAT 2.5 MCG/ACT AERS INL 2 PFS PO QD  . TRELEGY ELLIPTA 100-62.5-25 MCG/INH AEPB   . VENTOLIN HFA 108 (90 Base) MCG/ACT inhaler INL 2 PFS PO Q 4 TO 6 H PRF COUGH OR WHZ OR SOB   No current facility-administered medications for this visit.  (Other)      REVIEW OF SYSTEMS: ROS    Positive for: Neurological, Cardiovascular, Eyes   Negative for: Constitutional, Gastrointestinal, Skin, Genitourinary, Musculoskeletal, HENT, Endocrine, Respiratory, Psychiatric, Allergic/Imm, Heme/Lymph   Last edited by Matthew Folks, COA on 11/22/2018  1:40 PM. (History)       ALLERGIES No Known Allergies  PAST MEDICAL HISTORY Past Medical History:  Diagnosis Date  . Atrial flutter ([place])   . Hypertension   . Stroke [facility])    Past Surgical History:  Procedure Laterality Date  . CARDIOVERSION N/A 10/19/2017   Procedure: CARDIOVERSION;  Surgeon: Sanda Klein, MD;  Location: [place];  Service:  Cardiovascular;  Laterality: N/A;  . HERNIA REPAIR    . TEE WITHOUT CARDIOVERSION N/A 10/19/2017   Procedure: TRANSESOPHAGEAL ECHOCARDIOGRAM (TEE);  Surgeon: Sanda Klein, MD;  Location: [facility] ENDOSCOPY;  Service: Cardiovascular;  Laterality: N/A;    FAMILY HISTORY Family History  Problem Relation Age of Onset  . Dementia Mother     SOCIAL HISTORY Social History   Tobacco Use  . Smoking status: Former Smoker    Packs/day: 0.50    Types: Cigarettes    Last attempt to quit: 09/13/2017    Years since quitting: 1.1  . Smokeless tobacco: Never Used  Substance Use Topics  . Alcohol use: Yes     Comment: quit drinking 07/2017  . Drug use: No         OPHTHALMIC EXAM:  Base Eye Exam    Visual Acuity (Snellen - Linear)      Right Left   Dist cc 20/20 +2 20/50 -   Dist ph cc  20/40 -   Correction:  Glasses       Tonometry (Tonopen, 1:41 PM)      Right Left   Pressure 14 15       Pupils      Dark Light Shape React APD   Right 4 3 Round Brisk None   Left 4 3 Round Brisk None       Visual Fields (Counting fingers)      Left Right    Full Full       Extraocular Movement      Right Left    Full, Ortho Full, Ortho       Neuro/Psych    Oriented x3:  Yes   Mood/Affect:  Normal       Dilation    Both eyes:  1.0% Mydriacyl, 2.5% Phenylephrine @ 1:41 PM        Slit Lamp and Fundus Exam    Slit Lamp Exam      Right Left   Lids/Lashes Dermatochalasis - upper lid Dermatochalasis - upper lid   Conjunctiva/Sclera nasal and temporal Pinguecula, Conjunctivochalasis, Melanosis nasal Pinguecula, Conjunctivochalasis, Melanosis   Cornea Arcus, 1+ Punctate epithelial erosions Arcus, 2+ inferior Punctate epithelial erosions with irregular epithelium   Anterior Chamber deep, Narrow temporal angle deep, Narrow temporal angle   Iris Round and dilated Round and dilated   Lens 2+ Nuclear sclerosis, 2-3+ Cortical cataract 2+ Nuclear sclerosis, 2-3+ Cortical cataract   Vitreous Vitreous syneresis Vitreous syneresis       Fundus Exam      Right Left   Disc Pink and Sharp, mild temporal PPA Pink and Sharp, prominent vessesls within, mild temporal PPA   C/D Ratio 0.6 0.6   Macula Flat, Good foveal reflex, Retinal pigment epithelial mottling, No heme or edema Flat, Blunted foveal reflex, mild Epiretinal membrane, trace Cystic changes, Retinal pigment epithelial mottling   Vessels mild Vascular attenuation, Copper wiring Dilated veins, Copper wiring, Tortuous, AV crossing changes   Periphery Attached, mild RPE changes (scattered reticular degeneratin and drusen) Attached, mild RPE  changes (scattered reticular degeneratin and drusen)        Refraction    Wearing Rx      Sphere Cylinder Axis Add   Right +0.25 +0.50 174 +2.50   Left +0.50 Sphere  +2.50   Age:  42yr   Type:  PAL       Manifest Refraction      Sphere Cylinder Axis Dist VA   Right +0.25 +0.50 175 20/20+2  Left +0.50 +0.25 153 20/40-          IMAGING AND PROCEDURES  Imaging and Procedures for @TODAY @  OCT, Retina - OU - Both Eyes       Right Eye Quality was good. Central Foveal Thickness: 259. Progression has no prior data. Findings include normal foveal contour, no SRF, no IRF, vitreomacular adhesion .   Left Eye Quality was good. Central Foveal Thickness: 248. Progression has no prior data. Findings include abnormal foveal contour, epiretinal membrane, no SRF, intraretinal fluid (Macular cyst temporal fovea).   Notes *Images captured and stored on drive  Diagnosis / Impression:  OD: NFP, No IRF/SRF, +VMA OS: mild ERM with cystic changes   Clinical management:  See below  Abbreviations: NFP - Normal foveal profile. CME - cystoid macular edema. PED - pigment epithelial detachment. IRF - intraretinal fluid. SRF - subretinal fluid. EZ - ellipsoid zone. ERM - epiretinal membrane. ORA - outer retinal atrophy. ORT - outer retinal tubulation. SRHM - subretinal hyper-reflective material                 ASSESSMENT/PLAN:    ICD-10-CM   1. Epiretinal membrane (ERM) of left eye H35.372   2. Retinal edema H35.81 OCT, Retina - OU - Both Eyes  3. Essential hypertension I10   4. Hypertensive retinopathy of both eyes H35.033   5. Combined forms of age-related cataract of both eyes H25.813     1,2. ERM with mild cystic changes OS - The natural history, anatomy, potential for loss of vision, and treatment options including vitrectomy techniques and the complications of endophthalmitis, retinal detachment, vitreous hemorrhage, cataract progression and permanent vision loss discussed  with the patient. - mild ERM, tr cystic changes OS - BCVA 20/40 OS - no metamorphopsia, but unclear whether 20/40 vision is due to ERM or cataract - no indication for surgery at this time - monitor for now and recommend having cataract surgery (see below) - f/u 3-4 mos  3,4. Hypertensive retinopathy OU  - discussed importance of tight BP control  - monitor  5. Mixed form age related cataracts  - The symptoms of cataract, surgical options, and treatments and risks were discussed with patient.  - discussed diagnosis and progression  - under the expert management of [place]  - clear from a retina standpoint to proceed with cataract surgery when pt and surgeon are ready  - discussed the possibility of vision improvement post-CEIOL being limited by ERM OS   Ophthalmic Meds Ordered this visit:  No orders of the defined types were placed in this encounter.      Return for F/U 3-4 months, ERM OS.  There are no Patient Instructions on file for this visit.   Explained the diagnoses, plan, and follow up with the patient and they expressed understanding.  Patient expressed understanding of the importance of proper follow up care.   This document serves as a record of services personally performed by Gardiner Sleeper, MD, PhD. It was created on their behalf by Ernest Mallick, OA, an ophthalmic assistant. The creation of this record is the provider's dictation and/or activities during the visit.    Electronically signed by: Ernest Mallick, OA  12.09.19 12:05 AM    Gardiner Sleeper, M.D., Ph.D. Diseases & Surgery of the Retina and Vitreous [facility]  I have reviewed the above documentation for accuracy and completeness, and I agree with the above. Gardiner Sleeper, M.D., Ph.D. 11/23/18 12:05  AM   Abbreviations: M myopia (nearsighted); A astigmatism; H hyperopia (farsighted); P presbyopia; Mrx spectacle prescription;  CTL contact lenses; OD right eye; OS left  eye; OU both eyes  XT exotropia; ET esotropia; PEK punctate epithelial keratitis; PEE punctate epithelial erosions; DES dry eye syndrome; MGD meibomian gland dysfunction; ATs artificial tears; PFAT's preservative free artificial tears; [place] nuclear sclerotic cataract; PSC posterior subcapsular cataract; ERM epi-retinal membrane; PVD posterior vitreous detachment; RD retinal detachment; DM diabetes mellitus; DR diabetic retinopathy; NPDR non-proliferative diabetic retinopathy; PDR proliferative diabetic retinopathy; CSME clinically significant macular edema; DME diabetic macular edema; dbh dot blot hemorrhages; CWS cotton wool spot; POAG primary open angle glaucoma; C/D cup-to-disc ratio; HVF humphrey visual field; GVF goldmann visual field; OCT optical coherence tomography; IOP intraocular pressure; BRVO Branch retinal vein occlusion; CRVO central retinal vein occlusion; CRAO central retinal artery occlusion; BRAO branch retinal artery occlusion; RT retinal tear; SB scleral buckle; PPV pars plana vitrectomy; VH Vitreous hemorrhage; PRP panretinal laser photocoagulation; IVK intravitreal kenalog; VMT vitreomacular traction; MH Macular hole;  NVD neovascularization of the disc; NVE neovascularization elsewhere; AREDS age related eye disease study; ARMD age related macular degeneration; POAG primary open angle glaucoma; EBMD epithelial/anterior basement membrane dystrophy; ACIOL anterior chamber intraocular lens; IOL intraocular lens; PCIOL posterior chamber intraocular lens; Phaco/IOL phacoemulsification with intraocular lens placement; [place] photorefractive keratectomy; LASIK laser assisted in situ keratomileusis; HTN hypertension; DM diabetes mellitus; COPD chronic obstructive pulmonary disease

## 2018-11-22 ENCOUNTER — Ambulatory Visit (INDEPENDENT_AMBULATORY_CARE_PROVIDER_SITE_OTHER): Payer: Medicare HMO | Admitting: Ophthalmology

## 2018-11-22 ENCOUNTER — Encounter (INDEPENDENT_AMBULATORY_CARE_PROVIDER_SITE_OTHER): Payer: Self-pay | Admitting: Ophthalmology

## 2018-11-22 DIAGNOSIS — H25813 Combined forms of age-related cataract, bilateral: Secondary | ICD-10-CM

## 2018-11-22 DIAGNOSIS — H3581 Retinal edema: Secondary | ICD-10-CM | POA: Diagnosis not present

## 2018-11-22 DIAGNOSIS — H35033 Hypertensive retinopathy, bilateral: Secondary | ICD-10-CM | POA: Diagnosis not present

## 2018-11-22 DIAGNOSIS — I10 Essential (primary) hypertension: Secondary | ICD-10-CM | POA: Diagnosis not present

## 2018-11-22 DIAGNOSIS — H35372 Puckering of macula, left eye: Secondary | ICD-10-CM | POA: Diagnosis not present

## 2019-06-07 ENCOUNTER — Telehealth: Payer: Self-pay | Admitting: Cardiovascular Disease

## 2019-06-07 NOTE — Telephone Encounter (Signed)
Pt report he received a call stating he was going to be prescribed a new cholesterol medication. Informed pt that per chart review, on 09/30/18 Dr. Oval Linsey recommended he continue with Atorvastatin and on 10/23 she reported his cholesterol looks excellent. Pt informed to contact pcp to make sure they haven't changed any of his medication. Pt voiced understanding.

## 2019-06-07 NOTE — Telephone Encounter (Signed)
New Message             *STAT* If patient is at the pharmacy, call can be transferred to refill team.   1. Which medications need to be refilled? (please list name of each medication and dose if known) Patient didn't know the name, it's for cholesterol   2. Which pharmacy/location (including street and city if local pharmacy) is medication to be sent to? [facility] [address]  3. Do they need a 30 day or 90 day supply? 30  Patient is needing the doctor to call  the pharmacy so that they can release the medication.

## 2019-07-22 ENCOUNTER — Other Ambulatory Visit: Payer: Self-pay | Admitting: Cardiology

## 2019-11-15 ENCOUNTER — Other Ambulatory Visit: Payer: Self-pay | Admitting: Cardiovascular Disease

## 2019-11-16 NOTE — Telephone Encounter (Signed)
Refill request for Eliquis

## 2020-09-25 ENCOUNTER — Other Ambulatory Visit: Payer: Medicare HMO

## 2020-09-25 DIAGNOSIS — Z20822 Contact with and (suspected) exposure to covid-19: Secondary | ICD-10-CM

## 2020-09-26 LAB — NOVEL CORONAVIRUS, NAA: SARS-CoV-2, NAA: DETECTED — AB

## 2020-09-26 LAB — SARS-COV-2, NAA 2 DAY TAT

## 2020-09-27 ENCOUNTER — Telehealth (HOSPITAL_COMMUNITY): Payer: Self-pay | Admitting: Emergency Medicine

## 2020-09-27 ENCOUNTER — Telehealth: Payer: Self-pay | Admitting: Family

## 2020-09-27 NOTE — Telephone Encounter (Signed)
Called to Discuss with patient about Covid symptoms and the use of the monoclonal antibody infusion for those with mild to moderate Covid symptoms and at a high risk of hospitalization.     Pt appears to qualify for this infusion due to co-morbid conditions and/or a member of an at-risk group in accordance with the FDA Emergency Use Authorization.    [name] tested positive for COVID on 09/25/20. Qualifying risk factors include Age >65, hypertension, stroke, tobacco use, chronic renal insufficency and high social risk vulnerability score.   Attempted to speak with [name] and was unable to reach him. No voicemail / MyChart has been set up. Text message sent to mobile number.   Marcos Eke, NP 09/27/2020 10:40 AM

## 2020-09-28 ENCOUNTER — Emergency Department (HOSPITAL_COMMUNITY): Payer: Medicare HMO

## 2020-09-28 ENCOUNTER — Encounter (HOSPITAL_COMMUNITY): Payer: Self-pay | Admitting: Emergency Medicine

## 2020-09-28 ENCOUNTER — Other Ambulatory Visit: Payer: Self-pay

## 2020-09-28 ENCOUNTER — Other Ambulatory Visit (HOSPITAL_COMMUNITY): Payer: Self-pay | Admitting: Emergency Medicine

## 2020-09-28 ENCOUNTER — Inpatient Hospital Stay (HOSPITAL_COMMUNITY)
Admission: EM | Admit: 2020-09-28 | Discharge: 2020-10-02 | DRG: 177 | Disposition: A | Discharge status: Home Health | Payer: Medicare HMO | Attending: Internal Medicine | Admitting: Internal Medicine

## 2020-09-28 DIAGNOSIS — I429 Cardiomyopathy, unspecified: Secondary | ICD-10-CM

## 2020-09-28 DIAGNOSIS — N179 Acute kidney failure, unspecified: Principal | ICD-10-CM | POA: Diagnosis present

## 2020-09-28 DIAGNOSIS — I5042 Chronic combined systolic (congestive) and diastolic (congestive) heart failure: Secondary | ICD-10-CM | POA: Diagnosis present

## 2020-09-28 DIAGNOSIS — Z7901 Long term (current) use of anticoagulants: Secondary | ICD-10-CM

## 2020-09-28 DIAGNOSIS — Z7951 Long term (current) use of inhaled steroids: Secondary | ICD-10-CM

## 2020-09-28 DIAGNOSIS — J9601 Acute respiratory failure with hypoxia: Secondary | ICD-10-CM | POA: Diagnosis present

## 2020-09-28 DIAGNOSIS — I110 Hypertensive heart disease with heart failure: Secondary | ICD-10-CM | POA: Diagnosis present

## 2020-09-28 DIAGNOSIS — U071 COVID-19: Secondary | ICD-10-CM | POA: Diagnosis not present

## 2020-09-28 DIAGNOSIS — I4892 Unspecified atrial flutter: Secondary | ICD-10-CM | POA: Diagnosis present

## 2020-09-28 DIAGNOSIS — J1282 Pneumonia due to coronavirus disease 2019: Secondary | ICD-10-CM | POA: Diagnosis present

## 2020-09-28 DIAGNOSIS — Z8673 Personal history of transient ischemic attack (TIA), and cerebral infarction without residual deficits: Secondary | ICD-10-CM

## 2020-09-28 DIAGNOSIS — Z79899 Other long term (current) drug therapy: Secondary | ICD-10-CM

## 2020-09-28 DIAGNOSIS — E875 Hyperkalemia: Secondary | ICD-10-CM | POA: Diagnosis not present

## 2020-09-28 DIAGNOSIS — E860 Dehydration: Secondary | ICD-10-CM | POA: Diagnosis present

## 2020-09-28 DIAGNOSIS — F17210 Nicotine dependence, cigarettes, uncomplicated: Secondary | ICD-10-CM | POA: Diagnosis present

## 2020-09-28 DIAGNOSIS — Z7982 Long term (current) use of aspirin: Secondary | ICD-10-CM

## 2020-09-28 HISTORY — DX: COVID-19: U07.1

## 2020-09-28 LAB — URINALYSIS, ROUTINE W REFLEX MICROSCOPIC
Bacteria, UA: NONE SEEN
Bilirubin Urine: NEGATIVE
Glucose, UA: NEGATIVE mg/dL
Ketones, ur: NEGATIVE mg/dL
Leukocytes,Ua: NEGATIVE
Nitrite: NEGATIVE
Protein, ur: 100 mg/dL — AB
Specific Gravity, Urine: 1.016 (ref 1.005–1.030)
pH: 5 (ref 5.0–8.0)

## 2020-09-28 LAB — TROPONIN I (HIGH SENSITIVITY)
Troponin I (High Sensitivity): 29 ng/L — ABNORMAL HIGH (ref <18)
Troponin I (High Sensitivity): 24 ng/L — ABNORMAL HIGH (ref <18)

## 2020-09-28 LAB — POTASSIUM: Potassium: 4.7 mmol/L (ref 3.5–5.1)

## 2020-09-28 LAB — CBC WITH DIFFERENTIAL/PLATELET
Abs Immature Granulocytes: 0.08 10*3/uL — ABNORMAL HIGH (ref 0.00–0.07)
Basophils Absolute: 0 10*3/uL (ref 0.0–0.1)
Basophils Relative: 0 %
Eosinophils Absolute: 0 10*3/uL (ref 0.0–0.5)
Eosinophils Relative: 0 %
HCT: 51 % (ref 39.0–52.0)
Hemoglobin: 16.5 g/dL (ref 13.0–17.0)
Immature Granulocytes: 1 %
Lymphocytes Relative: 14 %
Lymphs Abs: 1.1 10*3/uL (ref 0.7–4.0)
MCH: 30.6 pg (ref 26.0–34.0)
MCHC: 32.4 g/dL (ref 30.0–36.0)
MCV: 94.6 fL (ref 80.0–100.0)
Monocytes Absolute: 0.9 10*3/uL (ref 0.1–1.0)
Monocytes Relative: 12 %
Neutro Abs: 5.5 10*3/uL (ref 1.7–7.7)
Neutrophils Relative %: 73 %
Platelets: 404 10*3/uL — ABNORMAL HIGH (ref 150–400)
RBC: 5.39 MIL/uL (ref 4.22–5.81)
RDW: 14 % (ref 11.5–15.5)
WBC: 7.6 10*3/uL (ref 4.0–10.5)
nRBC: 0 % (ref 0.0–0.2)

## 2020-09-28 LAB — ETHANOL: Alcohol, Ethyl (B): <10 mg/dL (ref <10)

## 2020-09-28 LAB — BASIC METABOLIC PANEL
Anion gap: 15 (ref 5–15)
BUN: 73 mg/dL — ABNORMAL HIGH (ref 8–23)
CO2: 26 mmol/L (ref 22–32)
Calcium: 9.8 mg/dL (ref 8.9–10.3)
Chloride: 100 mmol/L (ref 98–111)
Creatinine, Ser: 2.88 mg/dL — ABNORMAL HIGH (ref 0.61–1.24)
GFR, Estimated: 20 mL/min — ABNORMAL LOW (ref >60)
Glucose, Bld: 150 mg/dL — ABNORMAL HIGH (ref 70–99)
Potassium: 7.1 mmol/L (ref 3.5–5.1)
Sodium: 141 mmol/L (ref 135–145)

## 2020-09-28 LAB — MAGNESIUM: Magnesium: 3.1 mg/dL — ABNORMAL HIGH (ref 1.7–2.4)

## 2020-09-28 LAB — LACTIC ACID, PLASMA: Lactic Acid, Venous: 1.8 mmol/L (ref 0.5–1.9)

## 2020-09-28 MED ORDER — LACTATED RINGERS IV BOLUS
500.0000 mL | Freq: Once | INTRAVENOUS | Status: AC
  Administered 2020-09-28: 500 mL via INTRAVENOUS

## 2020-09-28 MED ORDER — FLUTICASONE-UMECLIDIN-VILANT 100-62.5-25 MCG/INH IN AEPB: INHALATION_SPRAY | Freq: Every day | RESPIRATORY_TRACT | Status: DC

## 2020-09-28 MED ORDER — LACTATED RINGERS IV BOLUS
1000.0000 mL | Freq: Once | INTRAVENOUS | Status: AC
  Administered 2020-09-28: 1000 mL via INTRAVENOUS

## 2020-09-28 MED ORDER — ATORVASTATIN CALCIUM 10 MG PO TABS
20.0000 mg | ORAL_TABLET | Freq: Every day | ORAL | Status: DC
  Administered 2020-09-29 – 2020-10-01 (×4): 20 mg via ORAL
  Filled 2020-09-28 (×4): qty 2

## 2020-09-28 MED ORDER — UMECLIDINIUM BROMIDE 62.5 MCG/INH IN AEPB
1.0000 | INHALATION_SPRAY | Freq: Every day | RESPIRATORY_TRACT | Status: DC
  Administered 2020-09-29 – 2020-10-02 (×4): 1 via RESPIRATORY_TRACT
  Filled 2020-09-28: qty 7

## 2020-09-28 MED ORDER — SODIUM CHLORIDE 0.9 % IV SOLN
200.0000 mg | Freq: Once | INTRAVENOUS | Status: AC
  Administered 2020-09-29: 200 mg via INTRAVENOUS
  Filled 2020-09-28: qty 40

## 2020-09-28 MED ORDER — ALBUTEROL SULFATE HFA 108 (90 BASE) MCG/ACT IN AERS: 1.0000 | INHALATION_SPRAY | RESPIRATORY_TRACT | Status: DC | PRN

## 2020-09-28 MED ORDER — LACTATED RINGERS IV SOLN: INTRAVENOUS | Status: DC

## 2020-09-28 MED ORDER — METOPROLOL SUCCINATE ER 25 MG PO TB24
25.0000 mg | ORAL_TABLET | Freq: Every day | ORAL | Status: DC
  Administered 2020-09-29 – 2020-10-02 (×4): 25 mg via ORAL
  Filled 2020-09-28 (×4): qty 1

## 2020-09-28 MED ORDER — APIXABAN 5 MG PO TABS
5.0000 mg | ORAL_TABLET | Freq: Two times a day (BID) | ORAL | Status: DC
  Administered 2020-09-29 – 2020-10-02 (×8): 5 mg via ORAL
  Filled 2020-09-28 (×8): qty 1

## 2020-09-28 MED ORDER — SODIUM CHLORIDE 0.9 % IV SOLN
100.0000 mg | Freq: Every day | INTRAVENOUS | Status: DC
  Administered 2020-09-30 – 2020-10-02 (×3): 100 mg via INTRAVENOUS
  Filled 2020-09-28 (×3): qty 20

## 2020-09-28 MED ORDER — ASPIRIN 81 MG PO CHEW
81.0000 mg | CHEWABLE_TABLET | Freq: Every day | ORAL | Status: DC
  Administered 2020-09-29 – 2020-10-02 (×4): 81 mg via ORAL
  Filled 2020-09-28 (×4): qty 1

## 2020-09-28 NOTE — ED Triage Notes (Signed)
Pt reports tested + for COVID on Wednesday.  States his daughter wanted him to come to hospital due to cough and decreased appetite.

## 2020-09-28 NOTE — ED Provider Notes (Signed)
Nathan Perez [facility] EMERGENCY DEPARTMENT Provider Note   CSN: [id number] Arrival date & time: 09/28/20  1335     History Chief Complaint  Patient presents with  . Covid Positive    Nathan Perez is a 74 y.o. male.  HPI      Nathan Perez is a 74 y.o. male, with a history of a flutter, HTN, stroke, presenting to the ED with dry cough for the past week.  Occasional shortness of breath. Generalized weakness for the past 2 weeks. Tested positive for Covid October 13.  No Covid vaccination. He thinks he has been drinking enough fluids. Denies fever/chills, chest pain, current shortness of breath, abdominal pain, falls/trauma, dizziness, neurologic deficits, N/V/D, urinary symptoms, or any other complaints.   Past Medical History:  Diagnosis Date  . Atrial flutter (HCC)   . COVID-19   . Hypertension   . Stroke [facility])     Patient Active Problem List   Diagnosis Date Noted  . CRI (chronic renal insufficiency), stage 3 (moderate) (HCC) 06/28/2018  . History of CVA (cerebrovascular accident) 04/25/2018  . Atrial flutter by electrocardiogram (HCC) 11/08/2017  . History of cardioversion 11/08/2017  . Abnormal nuclear cardiac imaging test 10/18/2017  . Cardiomyopathy (HCC) 10/18/2017  . Anticoagulated 10/18/2017  . Chronic systolic congestive heart failure (HCC)   . Essential hypertension 09/09/2017  . Atrial flutter (HCC) 09/09/2017  . TIA (transient ischemic attack) 09/09/2017  . Acute CVA (cerebrovascular accident) (HCC) 09/09/2017  . Dyslipidemia 09/09/2017  . Dysphagia   . Facial droop   . Fall   . Smoker 11/26/2009  . BRONCHITIS, CHRONIC 11/26/2009  . INGUINAL HERNIA, LEFT 11/26/2009  . DUODENAL ULCER, HX OF 11/26/2009    Past Surgical History:  Procedure Laterality Date  . CARDIOVERSION N/A 10/19/2017   Procedure: CARDIOVERSION;  Surgeon: Thurmon Fair, MD;  Location: [facility] ENDOSCOPY;  Service: Cardiovascular;  Laterality: N/A;  . HERNIA REPAIR    .  TEE WITHOUT CARDIOVERSION N/A 10/19/2017   Procedure: TRANSESOPHAGEAL ECHOCARDIOGRAM (TEE);  Surgeon: Thurmon Fair, MD;  Location: [facility] ENDOSCOPY;  Service: Cardiovascular;  Laterality: N/A;       Family History  Problem Relation Age of Onset  . Dementia Mother     Social History   Tobacco Use  . Smoking status: Former Smoker    Packs/day: 0.50    Types: Cigarettes    Quit date: 09/13/2017    Years since quitting: 3.0  . Smokeless tobacco: Never Used  Substance Use Topics  . Alcohol use: Yes    Comment: quit drinking 07/2017  . Drug use: No    Home Medications Prior to Admission medications   Medication Sig Start Date End Date Taking? Authorizing Provider  aspirin EC 81 MG EC tablet Take 1 tablet (81 mg total) by mouth daily. 09/13/17   Rai, Ripudeep K, MD  atorvastatin (LIPITOR) 20 MG tablet Take 1 tablet (20 mg total) by mouth at bedtime. 09/13/17   Rai, Delene Ruffini, MD  chlorthalidone (HYGROTON) 25 MG tablet Take 0.5 tablets (12.5 mg total) by mouth daily. 09/30/18 12/29/18  Chilton Si, MD  ELIQUIS 5 MG TABS tablet TAKE 1 TABLET(5 MG) BY MOUTH TWICE DAILY 11/16/19   Chilton Si, MD  metoprolol succinate (TOPROL-XL) 25 MG 24 hr tablet TAKE 1 TABLET BY MOUTH DAILY 07/25/19   Chilton Si, MD  sacubitril-valsartan (ENTRESTO) 97-103 MG Take 1 tablet by mouth 2 (two) times daily. 09/30/18   Chilton Si, MD  SPIRIVA RESPIMAT 2.5 MCG/ACT AERS  INL 2 PFS PO QD 10/07/18   [provider]  TRELEGY ELLIPTA 100-62.5-25 MCG/INH AEPB  11/18/18   [provider]  VENTOLIN HFA 108 (90 Base) MCG/ACT inhaler INL 2 PFS PO Q 4 TO 6 H PRF COUGH OR WHZ OR SOB 10/07/18   [provider]    Allergies    Patient has no known allergies.  Review of Systems   Review of Systems  Constitutional: Negative for chills, diaphoresis and fever.  Respiratory: Positive for cough and shortness of breath (occasional, none currently).   Cardiovascular: Negative for  chest pain and leg swelling.  Gastrointestinal: Negative for abdominal pain, diarrhea, nausea and vomiting.  Genitourinary: Negative for dysuria, frequency and hematuria.  Musculoskeletal: Negative for back pain, neck pain and neck stiffness.  Neurological: Positive for weakness (generalized). Negative for dizziness, syncope, numbness and headaches.  All other systems reviewed and are negative.   Physical Exam Updated Vital Signs BP (!) 101/55 (BP Location: Left Arm)   Pulse 87   Temp 98.4 F (36.9 C) (Oral)   Resp 16   SpO2 94%   Physical Exam Vitals and nursing note reviewed.  Constitutional:      General: He is not in acute distress.    Appearance: He is well-developed. He is not diaphoretic.  HENT:     Head: Normocephalic and atraumatic.     Mouth/Throat:     Mouth: Mucous membranes are dry.     Pharynx: Oropharynx is clear.  Eyes:     Conjunctiva/sclera: Conjunctivae normal.  Cardiovascular:     Rate and Rhythm: Normal rate and regular rhythm.     Pulses: Normal pulses.          Radial pulses are 2+ on the right side and 2+ on the left side.       Posterior tibial pulses are 2+ on the right side and 2+ on the left side.     Heart sounds: Normal heart sounds.     Comments: Tactile temperature in the extremities appropriate and equal bilaterally. Pulmonary:     Effort: Pulmonary effort is normal. No respiratory distress.     Breath sounds: Normal breath sounds.  Abdominal:     Palpations: Abdomen is soft.     Tenderness: There is no abdominal tenderness. There is no guarding.  Musculoskeletal:     Cervical back: Neck supple.     Right lower leg: No edema.     Left lower leg: No edema.  Lymphadenopathy:     Cervical: No cervical adenopathy.  Skin:    General: Skin is warm and dry.  Neurological:     Mental Status: He is alert and oriented to person, place, and time.     Comments: No noted acute cognitive deficit. Sensation grossly intact to light touch in the  extremities.   Grip strengths equal bilaterally.   Strength 5/5 in all extremities.  No gait disturbance.  Coordination intact.  Cranial nerves III-XII grossly intact.  Handles oral secretions without noted difficulty.  No noted phonation or speech deficit. No facial droop.   Psychiatric:        Mood and Affect: Mood and affect normal.        Speech: Speech normal.        Behavior: Behavior normal.     ED Results / Procedures / Treatments   Labs (all labs ordered are listed, but only abnormal results are displayed) Labs Reviewed  BASIC METABOLIC PANEL - Abnormal; Notable for the  following components:      Result Value   Potassium 7.1 (*)    Glucose, Bld 150 (*)    BUN 73 (*)    Creatinine, Ser 2.88 (*)    GFR, Estimated 20 (*)    All other components within normal limits  CBC WITH DIFFERENTIAL/PLATELET - Abnormal; Notable for the following components:   Platelets 404 (*)    Abs Immature Granulocytes 0.08 (*)    All other components within normal limits  URINALYSIS, ROUTINE W REFLEX MICROSCOPIC - Abnormal; Notable for the following components:   APPearance HAZY (*)    Hgb urine dipstick SMALL (*)    Protein, ur 100 (*)    All other components within normal limits  MAGNESIUM - Abnormal; Notable for the following components:   Magnesium 3.1 (*)    All other components within normal limits  TROPONIN I (HIGH SENSITIVITY) - Abnormal; Notable for the following components:   Troponin I (High Sensitivity) 29 (*)    All other components within normal limits  ETHANOL  LACTIC ACID, PLASMA  POTASSIUM  TROPONIN I (HIGH SENSITIVITY)    EKG EKG Interpretation  Date/Time:  Saturday September 28 2020 18:26:39 EDT Ventricular Rate:  96 PR Interval:  134 QRS Duration: 72 QT Interval:  342 QTC Calculation: 432 R Axis:   -4 Text Interpretation: Normal sinus rhythm Nonspecific ST abnormality Artifact Abnormal ECG Confirmed by Gerhard Munch [phone]) on 09/28/2020 8:51:31  PM   Radiology DG Chest Portable 1 View  Result Date: 09/28/2020 CLINICAL DATA:  Cough.  COVID. EXAM: PORTABLE CHEST 1 VIEW COMPARISON:  November 18, 2017 FINDINGS: Mild opacity in left base is new. Opacity in the right base is somewhat platelike and new as well. Hyperinflation of the lungs. The heart, hila, mediastinum, lungs, and pleura are otherwise unremarkable. No pneumothorax. IMPRESSION: 1. Left greater than right bibasilar pulmonary infiltrates are identified. I suspect the infiltrate on the left is due to the patient's reported COVID-19. The opacity on the right is somewhat platelike and could be due to developing pneumonia versus atelectasis. Recommend attention on follow-up. Electronically Signed   By: Gerome Sam III M.D   On: 09/28/2020 18:20    Procedures Procedures (including critical care time)  Medications Ordered in ED Medications  lactated ringers bolus 1,000 mL (has no administration in time range)  lactated ringers bolus 500 mL (500 mLs Intravenous New Bag/Given 09/28/20 1842)    ED Course  I have reviewed the triage vital signs and the nursing notes.  Pertinent labs & imaging results that were available during my care of the patient were reviewed by me and considered in my medical decision making (see chart for details).  Clinical Course as of Sep 29 2351  Sat Sep 28, 2020  1759 Spoke with patient's daughter, Lawernce Pitts. She states when patient tested positive for Covid, the nurse told them to take the patient to the emergency department, but that she does not know why. She has noted that he has been complaining of feeling generally weak for about the last couple weeks.  Cough for the last week. She has noted a decrease in appetite and decrease in oral intake.   [SJ]  2035 RN states the patient's blood samples sat in the tube system for quite awhile prior to being sent.  We will retest potassium.  Potassium(!!): 7.1 [SJ]  2345 Spoke with IM resident.   [SJ]     Clinical Course User Index [SJ] Jasmane Brockway C, PA-C  MDM Rules/Calculators/A&P                          Patient presents with cough and generalized weakness in the setting of positive Covid test October 13. Afebrile, not tachycardic, not tachypneic, maintains adequate SPO2 on room air.  Not hypotensive, however, blood pressure lower than previous values.  I personally reviewed and interpreted the patient's labs and imaging studies. Evidence of multifocal pneumonia on chest x-ray. No leukocytosis. Creatinine today is double what it was on last recorded value.  Increased BUN as well.  His generalized weakness could be from uremia or simply dehydration.  Patient could benefit from admission for continued hydration and evaluation.  Findings and plan of care discussed with Adalberto Coleob Lockwood, MD. Dr. Jeraldine LootsLockwood personally evaluated and examined this patient.   Vitals:   09/28/20 1410 09/28/20 1721 09/28/20 2242  BP: 120/66 (!) 101/55 103/65  Pulse: 94 87 78  Resp: 15 16 18   Temp: 98.2 F (36.8 C) 98.4 F (36.9 C) 98.1 F (36.7 C)  TempSrc: Oral Oral Oral  SpO2: 95% 94% 95%     Final Clinical Impression(s) / ED Diagnoses Final diagnoses:  Acute renal injury [facility](HCC)  Dehydration    Rx / DC Orders ED Discharge Orders    None       Concepcion LivingJoy, Kenon Delashmit C, PA-C 09/28/20 2352    Gerhard MunchLockwood, Robert, MD 09/29/20 2317

## 2020-09-28 NOTE — ED Notes (Signed)
Patient's daughter asking for update, Ander Slade, [place] will call daughter.

## 2020-09-28 NOTE — ED Notes (Signed)
Date and time results received: 09/28/20 8:41 PM  (use smartphrase ".now" to insert current time)  Test: K+ Critical Value: 7.1  Name of Provider Notified: Ander Slade, MD  Orders Received? Or Actions Taken?: Orders Received - See Orders for details

## 2020-09-29 DIAGNOSIS — F17210 Nicotine dependence, cigarettes, uncomplicated: Secondary | ICD-10-CM | POA: Diagnosis present

## 2020-09-29 DIAGNOSIS — I5042 Chronic combined systolic (congestive) and diastolic (congestive) heart failure: Secondary | ICD-10-CM | POA: Diagnosis present

## 2020-09-29 DIAGNOSIS — U071 COVID-19: Secondary | ICD-10-CM | POA: Diagnosis present

## 2020-09-29 DIAGNOSIS — Z8673 Personal history of transient ischemic attack (TIA), and cerebral infarction without residual deficits: Secondary | ICD-10-CM | POA: Diagnosis not present

## 2020-09-29 DIAGNOSIS — R0902 Hypoxemia: Secondary | ICD-10-CM | POA: Diagnosis not present

## 2020-09-29 DIAGNOSIS — N1831 Chronic kidney disease, stage 3a: Secondary | ICD-10-CM

## 2020-09-29 DIAGNOSIS — J1282 Pneumonia due to coronavirus disease 2019: Secondary | ICD-10-CM | POA: Diagnosis present

## 2020-09-29 DIAGNOSIS — I130 Hypertensive heart and chronic kidney disease with heart failure and stage 1 through stage 4 chronic kidney disease, or unspecified chronic kidney disease: Secondary | ICD-10-CM

## 2020-09-29 DIAGNOSIS — N179 Acute kidney failure, unspecified: Secondary | ICD-10-CM | POA: Diagnosis present

## 2020-09-29 DIAGNOSIS — I429 Cardiomyopathy, unspecified: Secondary | ICD-10-CM | POA: Diagnosis present

## 2020-09-29 DIAGNOSIS — E785 Hyperlipidemia, unspecified: Secondary | ICD-10-CM

## 2020-09-29 DIAGNOSIS — I4892 Unspecified atrial flutter: Secondary | ICD-10-CM | POA: Diagnosis present

## 2020-09-29 DIAGNOSIS — Z7982 Long term (current) use of aspirin: Secondary | ICD-10-CM | POA: Diagnosis not present

## 2020-09-29 DIAGNOSIS — Z7951 Long term (current) use of inhaled steroids: Secondary | ICD-10-CM | POA: Diagnosis not present

## 2020-09-29 DIAGNOSIS — Z87891 Personal history of nicotine dependence: Secondary | ICD-10-CM

## 2020-09-29 DIAGNOSIS — Z79899 Other long term (current) drug therapy: Secondary | ICD-10-CM | POA: Diagnosis not present

## 2020-09-29 DIAGNOSIS — E875 Hyperkalemia: Secondary | ICD-10-CM | POA: Diagnosis not present

## 2020-09-29 DIAGNOSIS — I110 Hypertensive heart disease with heart failure: Secondary | ICD-10-CM | POA: Diagnosis present

## 2020-09-29 DIAGNOSIS — Z7901 Long term (current) use of anticoagulants: Secondary | ICD-10-CM | POA: Diagnosis not present

## 2020-09-29 DIAGNOSIS — J9601 Acute respiratory failure with hypoxia: Secondary | ICD-10-CM | POA: Diagnosis present

## 2020-09-29 DIAGNOSIS — I5032 Chronic diastolic (congestive) heart failure: Secondary | ICD-10-CM

## 2020-09-29 DIAGNOSIS — E860 Dehydration: Secondary | ICD-10-CM | POA: Diagnosis present

## 2020-09-29 DIAGNOSIS — Z9889 Other specified postprocedural states: Secondary | ICD-10-CM

## 2020-09-29 DIAGNOSIS — J9600 Acute respiratory failure, unspecified whether with hypoxia or hypercapnia: Secondary | ICD-10-CM | POA: Diagnosis not present

## 2020-09-29 LAB — COMPREHENSIVE METABOLIC PANEL
ALT: 17 U/L (ref 0–44)
AST: 30 U/L (ref 15–41)
Albumin: 2.6 g/dL — ABNORMAL LOW (ref 3.5–5.0)
Alkaline Phosphatase: 37 U/L — ABNORMAL LOW (ref 38–126)
Anion gap: 13 (ref 5–15)
BUN: 62 mg/dL — ABNORMAL HIGH (ref 8–23)
CO2: 24 mmol/L (ref 22–32)
Calcium: 8.8 mg/dL — ABNORMAL LOW (ref 8.9–10.3)
Chloride: 104 mmol/L (ref 98–111)
Creatinine, Ser: 2.25 mg/dL — ABNORMAL HIGH (ref 0.61–1.24)
GFR, Estimated: 27 mL/min — ABNORMAL LOW (ref >60)
Glucose, Bld: 122 mg/dL — ABNORMAL HIGH (ref 70–99)
Potassium: 4.2 mmol/L (ref 3.5–5.1)
Sodium: 141 mmol/L (ref 135–145)
Total Bilirubin: 1 mg/dL (ref 0.3–1.2)
Total Protein: 6.3 g/dL — ABNORMAL LOW (ref 6.5–8.1)

## 2020-09-29 LAB — CBC WITH DIFFERENTIAL/PLATELET
Abs Immature Granulocytes: 0.06 10*3/uL (ref 0.00–0.07)
Basophils Absolute: 0 10*3/uL (ref 0.0–0.1)
Basophils Relative: 0 %
Eosinophils Absolute: 0.1 10*3/uL (ref 0.0–0.5)
Eosinophils Relative: 1 %
HCT: 35.3 % — ABNORMAL LOW (ref 39.0–52.0)
Hemoglobin: 12.3 g/dL — ABNORMAL LOW (ref 13.0–17.0)
Immature Granulocytes: 1 %
Lymphocytes Relative: 18 %
Lymphs Abs: 0.9 10*3/uL (ref 0.7–4.0)
MCH: 33.6 pg (ref 26.0–34.0)
MCHC: 34.8 g/dL (ref 30.0–36.0)
MCV: 96.4 fL (ref 80.0–100.0)
Monocytes Absolute: 0.6 10*3/uL (ref 0.1–1.0)
Monocytes Relative: 11 %
Neutro Abs: 3.5 10*3/uL (ref 1.7–7.7)
Neutrophils Relative %: 69 %
Platelets: 306 10*3/uL (ref 150–400)
RBC: 3.66 MIL/uL — ABNORMAL LOW (ref 4.22–5.81)
RDW: 14.1 % (ref 11.5–15.5)
WBC: 5 10*3/uL (ref 4.0–10.5)
nRBC: 0 % (ref 0.0–0.2)

## 2020-09-29 LAB — D-DIMER, QUANTITATIVE: D-Dimer, Quant: 1.55 ug/mL-FEU — ABNORMAL HIGH (ref 0.00–0.50)

## 2020-09-29 LAB — C-REACTIVE PROTEIN: CRP: 16.9 mg/dL — ABNORMAL HIGH (ref <1.0)

## 2020-09-29 LAB — FERRITIN: Ferritin: 226 ng/mL (ref 24–336)

## 2020-09-29 LAB — LACTIC ACID, PLASMA: Lactic Acid, Venous: 1.6 mmol/L (ref 0.5–1.9)

## 2020-09-29 MED ORDER — FLUTICASONE FUROATE-VILANTEROL 100-25 MCG/INH IN AEPB
1.0000 | INHALATION_SPRAY | Freq: Every day | RESPIRATORY_TRACT | Status: DC
  Administered 2020-09-29 – 2020-10-02 (×4): 1 via RESPIRATORY_TRACT
  Filled 2020-09-29: qty 28

## 2020-09-29 NOTE — Plan of Care (Signed)
  Problem: Education: Goal: Knowledge of risk factors and measures for prevention of condition will improve Outcome: Progressing   Problem: Respiratory: Goal: Will maintain a patent airway Outcome: Progressing   Problem: Respiratory: Goal: Complications related to the disease process, condition or treatment will be avoided or minimized Outcome: Progressing   

## 2020-09-29 NOTE — Progress Notes (Addendum)
HD#0 Subjective:  Overnight Events: Overnight patient was placed on supplemental oxygen, 2 L nasal cannula.  Patient reports feeling well this morning.  He states he has been feeling weak for about 2 weeks and started having chills a few days prior to being tested positive for Covid. Reports feeling short of breath and cough. Denies any history of lung disease or COPD.  States he has a history of wheezing for which she uses inhalers at home. He denies any other symptoms this morning.  Objective:  Vital signs in last 24 hours: Vitals:   09/29/20 0257 09/29/20 0345 09/29/20 0400 09/29/20 0731  BP: 132/63 132/63 (!) 104/58 116/83  Pulse: 74 74 62 60  Resp: 20 20 19 17   Temp: 98.4 F (36.9 C) 98.4 F (36.9 C) 97.6 F (36.4 C) 97.9 F (36.6 C)  TempSrc: Oral Oral Axillary Oral  SpO2: 94%  97% 92%  Weight:  65.3 kg    Height:  6\' 1"  (1.854 m)     Supplemental O2: Nasal Cannula SpO2: 92 % O2 Flow Rate (L/min): 3 L/min   Physical Exam:  Physical Exam Constitutional:      General: He is not in acute distress.    Appearance: Normal appearance.  Cardiovascular:     Rate and Rhythm: Normal rate and regular rhythm.     Pulses: Normal pulses.     Heart sounds: Normal heart sounds. No murmur heard.   Pulmonary:     Effort: Pulmonary effort is normal.     Breath sounds: Wheezing present.  Abdominal:     General: Abdomen is flat. Bowel sounds are normal. There is no distension.     Palpations: Abdomen is soft.     Tenderness: There is no abdominal tenderness.  Musculoskeletal:        General: No swelling or tenderness.  Skin:    General: Skin is warm and dry.  Neurological:     Mental Status: He is alert and oriented to person, place, and time.  Psychiatric:        Mood and Affect: Mood normal.        Behavior: Behavior normal.     Filed Weights   09/29/20 0345  Weight: 65.3 kg     Intake/Output Summary (Last 24 hours) at 09/29/2020 1017 Last data filed at  09/29/2020 0937 Gross per 24 hour  Intake 1789.34 ml  Output 640 ml  Net 1149.34 ml   Net IO Since Admission: 1,149.34 mL [09/29/20 1017]  Pertinent Labs: CBC Latest Ref Rng & Units 09/29/2020 09/28/2020 10/19/2017  WBC 4.0 - 10.5 K/uL 5.0 7.6 -  Hemoglobin 13.0 - 17.0 g/dL 12.3(L) 16.5 15.0  Hematocrit 39 - 52 % 35.3(L) 51.0 44.0  Platelets 150 - 400 K/uL 306 404(H) -    CMP Latest Ref Rng & Units 09/29/2020 09/28/2020 09/28/2020  Glucose 70 - 99 mg/dL 09/30/2020) - 09/30/2020)  BUN 8 - 23 mg/dL 235(T) - 614(E)  Creatinine 0.61 - 1.24 mg/dL 31(V) - 40(G)  Sodium 135 - 145 mmol/L 141 - 141  Potassium 3.5 - 5.1 mmol/L 4.2 4.7 7.1(HH)  Chloride 98 - 111 mmol/L 104 - 100  CO2 22 - 32 mmol/L 24 - 26  Calcium 8.9 - 10.3 mg/dL 8.67(Y) - 9.8  Total Protein 6.5 - 8.1 g/dL 6.3(L) - -  Total Bilirubin 0.3 - 1.2 mg/dL 1.0 - -  Alkaline Phos 38 - 126 U/L 37(L) - -  AST 15 - 41 U/L 30 - -  ALT 0 - 44 U/L 17 - -    Imaging: DG Chest Portable 1 View  Result Date: 09/28/2020 CLINICAL DATA:  Cough.  COVID. EXAM: PORTABLE CHEST 1 VIEW COMPARISON:  November 18, 2017 FINDINGS: Mild opacity in left base is new. Opacity in the right base is somewhat platelike and new as well. Hyperinflation of the lungs. The heart, hila, mediastinum, lungs, and pleura are otherwise unremarkable. No pneumothorax. IMPRESSION: 1. Left greater than right bibasilar pulmonary infiltrates are identified. I suspect the infiltrate on the left is due to the patient's reported COVID-19. The opacity on the right is somewhat platelike and could be due to developing pneumonia versus atelectasis. Recommend attention on follow-up. Electronically Signed   By: Gerome Sam III M.D   On: 09/28/2020 18:20    Assessment/Plan:   Principal Problem:   AKI (acute kidney injury) (HCC) Active Problems:   COVID-19   Patient Summary: Nathan Perez is a 74 y.o. with a pertinent PMH of atrial flutter status post DCCV, hypertension, HFpEF with  recovered ejection fraction, CVA who presented with generalized weakness, fatigue and dry cough and admitted for AKI and Covid pneumonia.  COVID pneumonia Tested positive on 10/13.  Saturating at 88% on 2 L nasal cannula.  CRP and D-dimer elevated.  Continue remdesivir.  Encouraged pulmonary rehab with use of incentive spirometer.  Will consider initiating dexamethasone if symptoms worsen. - Continue remdesivir (day 2/5) - Trend CMP, inflammatory markers - O2 as need to keep >88 - Early ambulation, incentive spirometry - Albuterol, Incruse and Breo Ellipta inhalers - PT to eval/treat   AKI Likely prerenal in the setting of decreased oral intake.  Status post a 1L LR bolus and ~811ml of LR. Creatinine 2.25 this morning, baseline 1.2.  Will discontinue fluids, given history of HF. - Discontinued fluids  - F/u AM CMP  Atrial flutter s/p DCCV Patient continues to be in normal sinus rhythm.  Status post successful cardioversion in 10/2017.  - Continue Eliquis 5 mg twice daily - Telemetry  Combined systolic and diastolic HF Patient has a history of HFrEF with recovered ejection fraction, most recent echo showed EF of 60 to 65% in 2019.  Continuing home medications.   - Continue Entresto, metoprolol and chlorthalidone - Strict ins and outs    CODE STATUS: Full code DIET: Heart healthy PPx: Eliquis 5 mg twice daily IVF: LR    Dispo: Anticipated discharge to [facility] pending clinical improvement.   Thalia Bloodgood DO Internal Medicine Resident PGY-1 Pager [phone] Please contact the on call pager after 5 pm and on weekends at [phone].

## 2020-09-29 NOTE — ED Notes (Signed)
Report called, pt to [id number].

## 2020-09-29 NOTE — Discharge Instructions (Addendum)
Thank you for allowing [place] to care for you during your hospitalization. I am glad you are feeling better. You were diagnosed with an acute kidney injury, meaning your kidneys were not working as well. We believe this was due to dehydration.   You required COVID treatment for a few days and will not continue this once you are discharged. Please quarantine for three more days,  Stop your quarantine on 10/05/20  Dehydration, Adult Dehydration is condition in which there is not enough water or other fluids in the body. This happens when a person loses more fluids than he or she takes in. Important body parts cannot work right without the right amount of fluids. Any loss of fluids from the body can cause dehydration. Dehydration can be mild, worse, or very bad. It should be treated right away to keep it from getting very bad. What are the causes? This condition may be caused by:  Conditions that cause loss of water or other fluids, such as: ? Watery poop (diarrhea). ? Vomiting. ? Sweating a lot. ? Peeing (urinating) a lot.  Not drinking enough fluids, especially when you: ? Are ill. ? Are doing things that take a lot of energy to do.  Other illnesses and conditions, such as fever or infection.  Certain medicines, such as medicines that take extra fluid out of the body (diuretics).  Lack of safe drinking water.  Not being able to get enough water and food. What increases the risk? The following factors may make you more likely to develop this condition:  Having a long-term (chronic) illness that has not been treated the right way, such as: ? Diabetes. ? Heart disease. ? Kidney disease.  Being 5 years of age or older.  Having a disability.  Living in a place that is high above the ground or sea (high in altitude). The thinner, dried air causes more fluid loss.  Doing exercises that put stress on your body for a long time. What are the signs or symptoms? Symptoms of dehydration  depend on how bad it is. Mild or worse dehydration  Thirst.  Dry lips or dry mouth.  Feeling dizzy or light-headed, especially when you stand up from sitting.  Muscle cramps.  Your body making: ? Dark pee (urine). Pee may be the color of tea. ? Less pee than normal. ? Less tears than normal.  Headache. Very bad dehydration  Changes in skin. Skin may: ? Be cold to the touch (clammy). ? Be blotchy or pale. ? Not go back to normal right after you lightly pinch it and let it go.  Little or no tears, pee, or sweat.  Changes in vital signs, such as: ? Fast breathing. ? Low blood pressure. ? Weak pulse. ? Pulse that is more than 100 beats a minute when you are sitting still.  Other changes, such as: ? Feeling very thirsty. ? Eyes that look hollow (sunken). ? Cold hands and feet. ? Being mixed up (confused). ? Being very tired (lethargic) or having trouble waking from sleep. ? Short-term weight loss. ? Loss of consciousness. How is this treated? Treatment for this condition depends on how bad it is. Treatment should start right away. Do not wait until your condition gets very bad. Very bad dehydration is an emergency. You will need to go to a hospital.  Mild or worse dehydration can be treated at home. You may be asked to: ? Drink more fluids. ? Drink an oral rehydration solution (ORS). This drink  helps get the right amounts of fluids and salts and minerals in the blood (electrolytes).  Very bad dehydration can be treated: ? With fluids through an IV tube. ? By getting normal levels of salts and minerals in your blood. This is often done by giving salts and minerals through a tube. The tube is passed through your nose and into your stomach. ? By treating the root cause. Follow these instructions at home: Oral rehydration solution If told by your doctor, drink an ORS:  Make an ORS. Use instructions on the package.  Start by drinking small amounts, about  cup (120 mL)  every 5-10 minutes.  Slowly drink more until you have had the amount that your doctor said to have. Eating and drinking         Drink enough clear fluid to keep your pee pale yellow. If you were told to drink an ORS, finish the ORS first. Then, start slowly drinking other clear fluids. Drink fluids such as: ? Water. Do not drink only water. Doing that can make the salt (sodium) level in your body get too low. ? Water from ice chips you suck on. ? Fruit juice that you have added water to (diluted). ? Low-calorie sports drinks.  Eat foods that have the right amounts of salts and minerals, such as: ? Bananas. ? Oranges. ? Potatoes. ? Tomatoes. ? Spinach.  Do not drink alcohol.  Avoid: ? Drinks that have a lot of sugar. These include:  High-calorie sports drinks.  Fruit juice that you did not add water to.  Soda.  Caffeine. ? Foods that are greasy or have a lot of fat or sugar. General instructions  Take over-the-counter and prescription medicines only as told by your doctor.  Do not take salt tablets. Doing that can make the salt level in your body get too high.  Return to your normal activities as told by your doctor. Ask your doctor what activities are safe for you.  Keep all follow-up visits as told by your doctor. This is important. Contact a doctor if:  You have pain in your belly (abdomen) and the pain: ? Gets worse. ? Stays in one place.  You have a rash.  You have a stiff neck.  You get angry or annoyed (irritable) more easily than normal.  You are more tired or have a harder time waking than normal.  You feel: ? Weak or dizzy. ? Very thirsty. Get help right away if you have:  Any symptoms of very bad dehydration.  Symptoms of vomiting, such as: ? You cannot eat or drink without vomiting. ? Your vomiting gets worse or does not go away. ? Your vomit has blood or green stuff in it.  Symptoms that get worse with treatment.  A fever.  A very  bad headache.  Problems with peeing or pooping (having a bowel movement), such as: ? Watery poop that gets worse or does not go away. ? Blood in your poop (stool). This may cause poop to look black and tarry. ? Not peeing in 6-8 hours. ? Peeing only a small amount of very dark pee in 6-8 hours.  Trouble breathing. These symptoms may be an emergency. Do not wait to see if the symptoms will go away. Get medical help right away. Call your local emergency services (911 in the [place] Do not drive yourself to the hospital. Summary  Dehydration is a condition in which there is not enough water or other fluids in the body.  This happens when a person loses more fluids than he or she takes in.  Treatment for this condition depends on how bad it is. Treatment should be started right away. Do not wait until your condition gets very bad.  Drink enough clear fluid to keep your pee pale yellow. If you were told to drink an oral rehydration solution (ORS), finish the ORS first. Then, start slowly drinking other clear fluids.  Take over-the-counter and prescription medicines only as told by your doctor.  Get help right away if you have any symptoms of very bad dehydration. This information is not intended to replace advice given to you by your health care provider. Make sure you discuss any questions you have with your health care provider. Document Revised: 07/13/2019 Document Reviewed: 07/13/2019 Elsevier Patient Education  2020 Elsevier Inc.   Acute Kidney Injury, Adult  Acute kidney injury is a sudden worsening of kidney function. The kidneys are organs that have several jobs. They filter the blood to remove waste products and extra fluid. They also maintain a healthy balance of minerals and hormones in the body, which helps control blood pressure and keep bones strong. With this condition, your kidneys do not do their jobs as well as they should. This condition ranges from mild to severe. Over time  it may develop into long-lasting (chronic) kidney disease. Early detection and treatment may prevent acute kidney injury from developing into a chronic condition. What are the causes? Common causes of this condition include:  A problem with blood flow to the kidneys. This may be caused by: ? Low blood pressure (hypotension) or shock. ? Blood loss. ? Heart and blood vessel (cardiovascular) disease. ? Severe burns. ? Liver disease.  Direct damage to the kidneys. This may be caused by: ? Certain medicines. ? A kidney infection. ? Poisoning. ? Being around or in contact with toxic substances. ? A surgical wound. ? A hard, direct hit to the kidney area.  A sudden blockage of urine flow. This may be caused by: ? Cancer. ? Kidney stones. ? An enlarged prostate in males. What are the signs or symptoms? Symptoms of this condition may not be obvious until the condition becomes severe. Symptoms of this condition can include:  Tiredness (lethargy), or difficulty staying awake.  Nausea or vomiting.  Swelling (edema) of the face, legs, ankles, or feet.  Problems with urination, such as: ? Abdominal pain, or pain along the side of your stomach (flank). ? Decreased urine production. ? Decrease in the force of urine flow.  Muscle twitches and cramps, especially in the legs.  Confusion or trouble concentrating.  Loss of appetite.  Fever. How is this diagnosed? This condition may be diagnosed with tests, including:  Blood tests.  Urine tests.  Imaging tests.  A test in which a sample of tissue is removed from the kidneys to be examined under a microscope (kidney biopsy). How is this treated? Treatment for this condition depends on the cause and how severe the condition is. In mild cases, treatment may not be needed. The kidneys may heal on their own. In more severe cases, treatment will involve:  Treating the cause of the kidney injury. This may involve changing any medicines  you are taking or adjusting your dosage.  Fluids. You may need specialized IV fluids to balance your body's needs.  Having a catheter placed to drain urine and prevent blockages.  Preventing problems from occurring. This may mean avoiding certain medicines or procedures that can cause  further injury to the kidneys. In some cases treatment may also require:  A procedure to remove toxic wastes from the body (dialysis or continuous renal replacement therapy - CRRT).  Surgery. This may be done to repair a torn kidney, or to remove the blockage from the urinary system. Follow these instructions at home: Medicines  Take over-the-counter and prescription medicines only as told by your health care provider.  Do not take any new medicines without your health care provider's approval. Many medicines can worsen your kidney damage.  Do not take any vitamin and mineral supplements without your health care provider's approval. Many nutritional supplements can worsen your kidney damage. Lifestyle  If your health care provider prescribed changes to your diet, follow them. You may need to decrease the amount of protein you eat.  Achieve and maintain a healthy weight. If you need help with this, ask your health care provider.  Start or continue an exercise plan. Try to exercise at least 30 minutes a day, 5 days a week.  Do not use any tobacco products, such as cigarettes, chewing tobacco, and e-cigarettes. If you need help quitting, ask your health care provider. General instructions  Keep track of your blood pressure. Report changes in your blood pressure as told by your health care provider.  Stay up to date with immunizations. Ask your health care provider which immunizations you need.  Keep all follow-up visits as told by your health care provider. This is important. Where to find more information  American Association of Kidney Patients: ResidentialShow.is  [employer]:  www.kidney.org  American Kid[employer] Fund: FightingMatch.com.ee  [employer] [facility]: ? www.lifeoptions.org ? www.kidneyschool.org Contact a health care provider if:  Your symptoms get worse.  You develop new symptoms. Get help right away if:  You develop symptoms of worsening kidney disease, which include: ? Headaches. ? Abnormally dark or light skin. ? Easy bruising. ? Frequent hiccups. ? Chest pain. ? Shortness of breath. ? End of menstruation in women. ? Seizures. ? Confusion or altered mental status. ? Abdominal or back pain. ? Itchiness.  You have a fever.  Your body is producing less urine.  You have pain or bleeding when you urinate. Summary  Acute kidney injury is a sudden worsening of kidney function.  Acute kidney injury can be caused by problems with blood flow to the kidneys, direct damage to the kidneys, and sudden blockage of urine flow.  Symptoms of this condition may not be obvious until it becomes severe. Symptoms may include edema, lethargy, confusion, nausea or vomiting, and problems passing urine.  This condition can usually be diagnosed with blood tests, urine tests, and imaging tests. Sometimes a kidney biopsy is done to diagnose this condition.  Treatment for this condition often involves treating the underlying cause. It is treated with fluids, medicines, dialysis, diet changes, or surgery. This information is not intended to replace advice given to you by your health care provider. Make sure you discuss any questions you have with your health care provider. Document Revised: 11/12/2017 Document Reviewed: 11/20/2016 Elsevier Patient Education  2020 [employer].    Information on my medicine - ELIQUIS (apixaban)  This medication education was reviewed with me or my healthcare representative as part of my discharge preparation.    Why was Eliquis prescribed for you? Eliquis was prescribed for you to reduce the risk of a blood clot  forming that can cause a stroke if you have a medical condition called atrial fibrillation (a type  of irregular heartbeat).  What do You need to know about Eliquis ? Take your Eliquis TWICE DAILY - one tablet in the morning and one tablet in the evening with or without food. If you have difficulty swallowing the tablet whole please discuss with your pharmacist how to take the medication safely.  Take Eliquis exactly as prescribed by your doctor and DO NOT stop taking Eliquis without talking to the doctor who prescribed the medication.  Stopping may increase your risk of developing a stroke.  Refill your prescription before you run out.  After discharge, you should have regular check-up appointments with your healthcare provider that is prescribing your Eliquis.  In the future your dose may need to be changed if your kidney function or weight changes by a significant amount or as you get older.  What do you do if you miss a dose? If you miss a dose, take it as soon as you remember on the same day and resume taking twice daily.  Do not take more than one dose of ELIQUIS at the same time to make up a missed dose.  Important Safety Information A possible side effect of Eliquis is bleeding. You should call your healthcare provider right away if you experience any of the following: ? Bleeding from an injury or your nose that does not stop. ? Unusual colored urine (red or dark brown) or unusual colored stools (red or black). ? Unusual bruising for unknown reasons. ? A serious fall or if you hit your head (even if there is no bleeding).  Some medicines may interact with Eliquis and might increase your risk of bleeding or clotting while on Eliquis. To help avoid this, consult your healthcare provider or pharmacist prior to using any new prescription or non-prescription medications, including herbals, vitamins, non-steroidal anti-inflammatory drugs (NSAIDs) and supplements.  This website has more  information on Eliquis (apixaban): http://www.eliquis.com/eliquis/home

## 2020-09-29 NOTE — Evaluation (Signed)
Physical Therapy Evaluation Patient Details Name: Nathan Perez MRN: [id number] DOB: [birth year] Today's Date: 09/29/2020   History of Present Illness  Pt is a 74 y.o. male with recent (+) COVID-19 on 09/25/20, now admitted 09/28/20 with generalized weakness, cough, fatigue. Workup for AKI, COVID-19 PNA. PMH includes aflutter s/p DCCV, HTN, HFpEF, CVA.    Clinical Impression  Pt presents with an overall decrease in functional mobility secondary to above. PTA, pt resides at boarding house where he has lived for a few years; also has wife who lives nearby he can stay with if needed. Initiated education re: current condition, O2 needs, activity recommendations, therex, energy conservation, and importance of mobility. Today, pt able to mobilize throughout room with min guard for balance due to apparent instability. Limited by generalized weakness, decreased activity tolerance and impaired balance strategies. SpO2 down to 84% on RA, requiring 2L O2  to recover to >/88% at end of session. Pt would benefit from continued acute PT services to maximize functional mobility and independence prior to d/c with HHPT services.    Follow Up Recommendations Home health PT;Supervision for mobility/OOB    Equipment Recommendations   (TBD)    Recommendations for Other Services       Precautions / Restrictions Precautions Precautions: Fall;Other (comment) Precaution Comments: Watch SpO2 Restrictions Weight Bearing Restrictions: No      Mobility  Bed Mobility Overal bed mobility: Modified Independent                Transfers Overall transfer level: Needs assistance Equipment used: None Transfers: Sit to/from Stand Sit to Stand: Min guard         General transfer comment: Increased time and effort, initial min guard standing from bed; able to stand from low toilet height with supervision, reliant on rail support to pull into standing  Ambulation/Gait Ambulation/Gait assistance: Min  guard Gait Distance (Feet): 34 Feet Assistive device: 1 person hand held assist;None Gait Pattern/deviations: Step-through pattern;Decreased stride length;Trunk flexed Gait velocity: Decreased   General Gait Details: Slow, mildly unsteady gait, initially with HHA and min guard; progressing to no HHA, but pt intermittently reaching to furniture/sink for added support; stability improving with distance. Further mobility limited by fatigue  Stairs            Wheelchair Mobility    Modified Rankin (Stroke Patients Only)       Balance Overall balance assessment: Needs assistance   Sitting balance-Leahy Scale: Good       Standing balance-Leahy Scale: Fair                               Pertinent Vitals/Pain Pain Assessment: No/denies pain    Home Living Family/patient expects to be discharged to:: Other (Comment) (Boarding house) Living Arrangements: Alone               Additional Comments: Reports living in boarding/"rooming house" with roommates (there's 28 of [place]); level entry in back, stairs in front with rail. Wife lives in home nearby and pt can stay with her    Prior Function Level of Independence: Independent         Comments: Reports, "If I go back to where I'm staying, they'll work me to death." Pt reports he works for [employer]. Difficult getting full picture of living arrangements, work and PLOF     Hand Dominance        Extremity/Trunk Assessment   Upper Extremity  Assessment Upper Extremity Assessment: Generalized weakness    Lower Extremity Assessment Lower Extremity Assessment: Generalized weakness       Communication   Communication: HOH  Cognition Arousal/Alertness: Awake/alert Behavior During Therapy: WFL for tasks assessed/performed Overall Cognitive Status: No family/caregiver present to determine baseline cognitive functioning Area of Impairment: Orientation;Attention;Following  commands;Safety/judgement;Awareness;Problem solving                 Orientation Level: Disoriented to;Situation Current Attention Level: Selective   Following Commands: Follows multi-step commands inconsistently Safety/Judgement: Decreased awareness of deficits Awareness: Intellectual;Emergent Problem Solving: Slow processing;Decreased initiation;Requires verbal cues General Comments: Increased time to answer date; unsure why he's in hospital. When asked if wife got sick, pt reports, "I'm not sure how she got out of this place." But aware he is at [facility]. Difficulty multi-tasking and answering questions. Some apparent cognitive deficits may be related to [facility]      General Comments General comments (skin integrity, edema, etc.): SpO2 84-90% on RA with activity; requiring 2L O2 [place] to recover to >/88% at end of session. Post-activity BP 132/62, pt denies dizziness with activity    Exercises Other Exercises Other Exercises: Pursed lip breathing practice   Assessment/Plan    PT Assessment Patient needs continued PT services  PT Problem List Decreased strength;Decreased activity tolerance;Decreased balance;Decreased mobility;Decreased cognition;Decreased knowledge of use of DME;Cardiopulmonary status limiting activity       PT Treatment Interventions DME instruction;Gait training;Stair training;Functional mobility training;Therapeutic activities;Therapeutic exercise;Balance training;Patient/family education    PT Goals (Current goals can be found in the Care Plan section)  Acute Rehab PT Goals Patient Stated Goal: Unsure if he'll d/c back to boarding house or stay with wife for a few days PT Goal Formulation: With patient Time For Goal Achievement: 10/13/20 Potential to Achieve Goals: Good    Frequency Min 3X/week   Barriers to discharge        Co-evaluation               AM-PAC PT "6 Clicks" Mobility  Outcome Measure Help needed turning from your back to your side  while in a flat bed without using bedrails?: None Help needed moving from lying on your back to sitting on the side of a flat bed without using bedrails?: None Help needed moving to and from a bed to a chair (including a wheelchair)?: A Little Help needed standing up from a chair using your arms (e.g., wheelchair or bedside chair)?: A Little Help needed to walk in hospital room?: A Little Help needed climbing 3-5 steps with a railing? : A Little 6 Click Score: 20    End of Session   Activity Tolerance: Patient tolerated treatment well;Patient limited by fatigue Patient left: in chair;with call bell/phone within reach;with chair alarm set Nurse Communication: Mobility status PT Visit Diagnosis: Other abnormalities of gait and mobility (R26.89);Unsteadiness on feet (R26.81)    Time: 3846-6599 PT Time Calculation (min) (ACUTE ONLY): 31 min   Charges:   PT Evaluation $PT Eval Moderate Complexity: 1 Mod PT Treatments $Therapeutic Activity: 8-22 mins        Ina Homes, PT, DPT Acute Rehabilitation Services  Pager [phone] Office [phone]   Malachy Chamber 09/29/2020, 2:30 PM

## 2020-09-29 NOTE — H&P (Addendum)
Date: 09/29/2020               Patient Name:  Nathan Perez MRN: [id number]  DOB: 07-Mar-1946 Age / Sex: 74 y.o., male   PCP: [facility]         Medical Service: Internal Medicine Teaching Service         Attending Physician: Dr. Sandre Kitty, Elwin Mocha, MD    First Contact: Dr. Sharren Bridge, DO Pager: [phone]  Second Contact: Dr. Dolan Amen Pager: [phone]       After Hours (After 5p/  First Contact Pager: [phone]  weekends / holidays): Second Contact Pager: [phone]   Chief Complaint: Generalized weakness and cough  History of Present Illness: [name] is a 74 year old male with PMH of afib s/p DCCV, hypertension, HFpEF, CVA and a recent COVID-19 infection who presents to the ED for generalized weakness, fatigue and dry cough after testing positive for Covid 4 days ago. Patient states he lives in a boarding house and for the past 2 weeks he has had generalized weakness. Yesterday, he felt "woozy" so he asked his daughter to bring him to the hospital. Patient states he does not remember that he tested positive for Covid and has not been informed about the vaccine. Patient endorses some mild shortness of breath and decreased p.o. intake but denies any chest pain, abdominal pain, fever/chills, dizziness, nausea/vomiting or headache.  Meds: Aspirin 81 mg daily Lipitor 20 mg daily at bedtime Chlorthalidone 12.5 mg daily Eliquis 5 mg twice daily Toprol-XL 25 mg daily Entresto 97-103 mg twice daily Spiriva Respimat inhaler 2 puffs daily Ventolin HFA inhaler 2 puffs every 4-6 hours as needed  No outpatient medications have been marked as taking for the 09/28/20 encounter Nathan Perez Encounter).   Allergies: Allergies as of 09/28/2020  . (No Known Allergies)   Past Medical History:  Diagnosis Date  . Atrial flutter (HCC)   . COVID-19   . Hypertension   . Stroke [facility])    Family History: History of dementia in mother  Social History: Patient  states he lives in a boarding house. He does his own ADLs but gets help from CNA's at the facility. States he has 6 sons and 1 daughter. One of his son stays close to him. Patient states he quit smoking 3 years ago but before that he was smoking about a pack a day. He denies EtOH use or illicit drug use.  Review of Systems: A complete ROS was negative except as per HPI.   Physical Exam: Blood pressure (!) 104/58, pulse 62, temperature 97.6 F (36.4 C), temperature source Axillary, resp. rate 19, height 6\' 1"  (1.854 m), weight 65.3 kg, SpO2 97 %.  General: Pleasant, well-appearing elderly man laying in bed. No acute distress. Head: Normocephalic. Atraumatic. CV: RRR. No murmurs, rubs, or gallops. No LE edema Pulmonary: Lungs CTAB. Normal effort. No wheezing or rales. Abdominal: Soft, nontender, nondistended. Normal bowel sounds. Extremities: Palpable pulses. Normal ROM. Skin: Warm and dry. No obvious rash or lesions. Neuro: A&Ox3. Moves all extremities. Normal sensation. No focal deficit. Psych: Normal mood and affect.  EKG: personally reviewed my interpretation is normal sinus rhythm  CXR: personally reviewed my interpretation is hyperinflated lungs with left greater than right bibasilar pulmonary infiltrate   Assessment & Plan by Problem: Principal Problem:   AKI (acute kidney injury) (HCC) Active Problems:   COVID-19  [name] is a 74 year old male with PMH of atrial flutter, HFpEF,  hypertension, CVA and a recent COVID-19 diagnosis who presented with generalized weakness and dry cough found to have AKI. Currently undergoing management of his Covid infection and AKI.  #Acute respiratory failure 2/2 COVID pneumonia Patient w/ hx of medically controlled heart failure presented w/ generalized weakness and dry cough and found to be in acute hypoxic respiratory failure, currently satting well on room air. Normal lactic acid. Unvaccinated status. COVID + on 10/13. CXR shows  pulmonary infiltrate likely due to Covid pneumonia. Need admission for treatment for COVID pneumonia. --Start remdesivir (day 1/5) --Trend CMP, inflammatory markers --O2 as need to keep >88 --Early ambulation, incentive spirometry --Albuterol, Incruse and Breo Ellipta inhalers --Pulse Ox  #AKI, prerenal Patient with recent Covid infection and decreased p.o. intake found to have creatinine of 2.88 up from baseline of 1.24 a year ago.  UA consistent with dehydration.  Found to have decreased skin turgor on exam.  Will resuscitate with IV fluids and monitor kidney function. --S./p LR 1000 mL bolus --LR infusion @ 125 mL/hr x12 hrs --F/u AM CMP  #Atrial flutter s/p DCCV Patient found to be in normal sinus rhythm on admission.  Patient had successful cardioversion in 10/2017.  Endorse fatigue and dizziness but denies any palpations.  --Eliquis 5 mg BID --Tele  #Combined systolic and diastolic HF Echo 1829 showed EF of 25-30% with severe, diffuse hypokinesis. Patient was started on medical treatment with Entresto, beta-blocker and a diuretic. Echo in 06/2018 showed significant improvements with a EF of 60-65% and grade 1 diastolic dysfunction.  Currently on Entresto, metoprolol and chlorthalidone.  Denies any dyspnea on exertion. --Metoprolol 25 mg p.o. daily --Strict I & Os   #Hypertension Patient's BP stable on admission. --Metoprolol 25 mg p.o. daily --Daily vitals  #Hyperlipidemia  Patient has a history of left MCA stroke in 2018. LDL of 88. --Atorvastatin 20 mg at bedtime --Aspirin 81 mg daily   CODE STATUS: Full code DIET: Heart healthy PPx: Eliquis 5 mg twice daily  Dispo: Admit patient to Observation with expected length of stay less than 2 midnights.  Signed: Steffanie Rainwater, MD 09/29/2020, 5:07 AM  Pager: [phone] Internal Medicine Teaching Service After 5pm on weekdays and 1pm on weekends: On Call pager: [phone]

## 2020-09-30 DIAGNOSIS — I429 Cardiomyopathy, unspecified: Secondary | ICD-10-CM

## 2020-09-30 DIAGNOSIS — R0902 Hypoxemia: Secondary | ICD-10-CM

## 2020-09-30 LAB — COMPREHENSIVE METABOLIC PANEL
ALT: 22 U/L (ref 0–44)
AST: 43 U/L — ABNORMAL HIGH (ref 15–41)
Albumin: 2.7 g/dL — ABNORMAL LOW (ref 3.5–5.0)
Alkaline Phosphatase: 49 U/L (ref 38–126)
Anion gap: 12 (ref 5–15)
BUN: 38 mg/dL — ABNORMAL HIGH (ref 8–23)
CO2: 28 mmol/L (ref 22–32)
Calcium: 9.2 mg/dL (ref 8.9–10.3)
Chloride: 99 mmol/L (ref 98–111)
Creatinine, Ser: 1.69 mg/dL — ABNORMAL HIGH (ref 0.61–1.24)
GFR, Estimated: 39 mL/min — ABNORMAL LOW (ref >60)
Glucose, Bld: 93 mg/dL (ref 70–99)
Potassium: 5 mmol/L (ref 3.5–5.1)
Sodium: 139 mmol/L (ref 135–145)
Total Bilirubin: 0.7 mg/dL (ref 0.3–1.2)
Total Protein: 6.4 g/dL — ABNORMAL LOW (ref 6.5–8.1)

## 2020-09-30 LAB — D-DIMER, QUANTITATIVE: D-Dimer, Quant: 2 ug/mL-FEU — ABNORMAL HIGH (ref 0.00–0.50)

## 2020-09-30 LAB — CBC WITH DIFFERENTIAL/PLATELET
Abs Immature Granulocytes: 0.09 10*3/uL — ABNORMAL HIGH (ref 0.00–0.07)
Basophils Absolute: 0 10*3/uL (ref 0.0–0.1)
Basophils Relative: 1 %
Eosinophils Absolute: 0.1 10*3/uL (ref 0.0–0.5)
Eosinophils Relative: 1 %
HCT: 36 % — ABNORMAL LOW (ref 39.0–52.0)
Hemoglobin: 12.2 g/dL — ABNORMAL LOW (ref 13.0–17.0)
Immature Granulocytes: 1 %
Lymphocytes Relative: 19 %
Lymphs Abs: 1.3 10*3/uL (ref 0.7–4.0)
MCH: 33.3 pg (ref 26.0–34.0)
MCHC: 33.9 g/dL (ref 30.0–36.0)
MCV: 98.4 fL (ref 80.0–100.0)
Monocytes Absolute: 1 10*3/uL (ref 0.1–1.0)
Monocytes Relative: 14 %
Neutro Abs: 4.4 10*3/uL (ref 1.7–7.7)
Neutrophils Relative %: 64 %
Platelets: 448 10*3/uL — ABNORMAL HIGH (ref 150–400)
RBC: 3.66 MIL/uL — ABNORMAL LOW (ref 4.22–5.81)
RDW: 14.5 % (ref 11.5–15.5)
WBC: 6.9 10*3/uL (ref 4.0–10.5)
nRBC: 0 % (ref 0.0–0.2)

## 2020-09-30 LAB — C-REACTIVE PROTEIN: CRP: 14.7 mg/dL — ABNORMAL HIGH (ref <1.0)

## 2020-09-30 LAB — FERRITIN: Ferritin: 236 ng/mL (ref 24–336)

## 2020-09-30 MED ORDER — DEXAMETHASONE 6 MG PO TABS
6.0000 mg | ORAL_TABLET | Freq: Every day | ORAL | Status: DC
  Administered 2020-09-30 – 2020-10-02 (×3): 6 mg via ORAL
  Filled 2020-09-30 (×2): qty 1

## 2020-09-30 MED ORDER — LACTATED RINGERS IV SOLN: INTRAVENOUS | Status: AC

## 2020-09-30 NOTE — Evaluation (Signed)
Occupational Therapy Evaluation Patient Details Name: Nathan Perez MRN: [id number] DOB: 05-14-1946 Today's Date: 09/30/2020    History of Present Illness Pt is a 74 y.o. male with recent (+) COVID-19 on 09/25/20, now admitted 09/28/20 with generalized weakness, cough, fatigue. Workup for AKI, COVID-19 PNA. PMH includes aflutter s/p DCCV, HTN, HFpEF, CVA.   Clinical Impression   PTA, pt was living at a boarding house(?) and was independent with ADLs and IADLs; unsure of reliability of information as pt vague about PLOF and home information. Pt currently requiring Min Guard-Min A for LB ADLs, ADLs in standing, and functional mobility with RW. Pt presenting with decreased safety awareness, balance, strength, and activity tolerance. Pt performing functional mobility in hallway with Min guard-Min A and RW; SpO2 maintaining in 90s on RA. Walking ~200 feet with seated rest breaks. Pt would benefit from further acute OT to facilitate safe dc. Recommend dc to home with HHOT for further OT to optimize safety, independence with ADLs, and return to PLOF.     Follow Up Recommendations  Home health OT;Supervision/Assistance - 24 hour    Equipment Recommendations  None recommended by OT    Recommendations for Other Services PT consult     Precautions / Restrictions Precautions Precautions: Fall Precaution Comments: Watch SpO2      Mobility Bed Mobility               General bed mobility comments: In recliner upon arrival  Transfers Overall transfer level: Needs assistance Equipment used: Rolling walker (2 wheeled) Transfers: Sit to/from Stand Sit to Stand: Min guard;Min assist         General transfer comment: Min Guard A for safety. Use of arm rests to power up. From chair without arm rests, pt requiring MIn A for power up    Balance Overall balance assessment: Needs assistance   Sitting balance-Leahy Scale: Good       Standing balance-Leahy Scale: Fair                              ADL either performed or assessed with clinical judgement   ADL Overall ADL's : Needs assistance/impaired Eating/Feeding: Supervision/ safety;Set up;Sitting   Grooming: Oral care;Supervision/safety;Set up;Sitting   Upper Body Bathing: Supervision/ safety;Set up;Sitting   Lower Body Bathing: Min guard;Sit to/from stand;Minimal assistance Lower Body Bathing Details (indicate cue type and reason): At times needing Min A for dynamic balance Upper Body Dressing : Supervision/safety;Set up;Sitting   Lower Body Dressing: Min guard;Minimal assistance;Sit to/from stand   Toilet Transfer: Min guard;Minimal assistance;Ambulation;RW (simulated to chair) Toilet Transfer Details (indicate cue type and reason): Min A for power up from chair without armrests         Functional mobility during ADLs: Min guard;Minimal assistance;Rolling walker;Cueing for safety General ADL Comments: Pt presenting with decreased balance, safety awareness, strength, and activity tolerance. Performing majoriy of session with Min Guard A. A few moments when pt needed Min A in standing for correcting balance     Vision         Perception     Praxis      Pertinent Vitals/Pain Pain Assessment: No/denies pain     Hand Dominance Left   Extremity/Trunk Assessment Upper Extremity Assessment Upper Extremity Assessment: Generalized weakness   Lower Extremity Assessment Lower Extremity Assessment: Generalized weakness   Cervical / Trunk Assessment Cervical / Trunk Assessment: Kyphotic   Communication Communication Communication: HOH   Cognition Arousal/Alertness: Awake/alert  Behavior During Therapy: WFL for tasks assessed/performed Overall Cognitive Status: No family/caregiver present to determine baseline cognitive functioning Area of Impairment: Attention;Following commands;Safety/judgement;Awareness;Problem solving                   Current Attention Level: Sustained    Following Commands: Follows multi-step commands inconsistently Safety/Judgement: Decreased awareness of deficits;Decreased awareness of safety Awareness: Intellectual;Emergent Problem Solving: Slow processing;Decreased initiation;Requires verbal cues General Comments: Requiring increased cues and time. Pt following simple commands and performing simple ADLs with increased time. Poor awareness of deficits and safety   General Comments  SpO2 >94% on RA with O2 monitor on ear. HR 70s. RR 20-30s.     Exercises     Shoulder Instructions      Home Living Family/patient expects to be discharged to:: Other (Comment) (Boarding house) Living Arrangements: Alone     Home Access: Level entry                         Additional Comments: Reports living in boarding/"rooming house" with roommates (there's 28 of [place]); level entry in back, stairs in front with rail. 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 Unsure of reliability as pt vague with answers about home and PLOF        OT Problem List: Decreased strength;Decreased range of motion;Decreased activity tolerance;Impaired balance (sitting and/or standing);Decreased knowledge of precautions;Decreased knowledge of use of DME or AE;Cardiopulmonary status limiting activity;Decreased cognition;Decreased safety awareness      OT Treatment/Interventions: Self-care/ADL training;Therapeutic exercise;Energy conservation;DME and/or AE instruction;Therapeutic activities;Patient/family education;Balance training    OT Goals(Current goals can be found in the care plan section) Acute Rehab OT Goals Patient Stated Goal: "Get better" OT Goal Formulation: With patient Time For Goal Achievement: 10/14/20 Potential to Achieve Goals: Good  OT Frequency: Min 2X/week   Barriers to D/C:             Co-evaluation              AM-PAC OT "6 Clicks" Daily Activity     Outcome Measure Help from another person eating meals?: A Little Help from another person taking care of personal grooming?: A Little Help from another person toileting, which includes using toliet, bedpan, or urinal?: A Little Help from another person bathing (including washing, rinsing, drying)?: A Little Help from another person to put on and taking off regular upper body clothing?: A Little Help from another person to put on and taking off regular lower body clothing?: A Little 6 Click Score: 18   End of Session Equipment Utilized During Treatment: Gait belt;Rolling walker Nurse Communication: Mobility status;Other (comment) (Performing mobility in RA)  Activity Tolerance: Patient tolerated treatment well Patient left: in chair;with call bell/phone within reach  OT Visit Diagnosis: Unsteadiness on feet (R26.81);Other abnormalities of gait and mobility (R26.89);Muscle weakness (generalized) (M62.81)                Time: 0350-0938 OT Time Calculation (min): 29 min Charges:  OT General Charges $OT Visit: 1 Visit OT Evaluation $OT Eval Moderate Complexity: 1 Mod OT Treatments $Self Care/Home Management : 8-22 mins  Anesha Hackert MSOT, OTR/L Acute Rehab Pager: [phone] Office: [phone]  Theodoro Grist Zarriah Starkel 09/30/2020, 4:55 PM

## 2020-09-30 NOTE — Progress Notes (Signed)
SATURATION QUALIFICATIONS: (This note is used to comply with regulatory documentation for home oxygen)  Patient Saturations on Room Air at Rest = 93%  Patient Saturations on Room Air while Ambulating = 80%  Patient Saturations on 6 Liters of oxygen while Ambulating = 91%  Please briefly explain why patient needs home oxygen:  Pt's oxygen saturation quickly drops to 80% upon standing and taking a step, Saturation slowly improved with up to 6 L of oxygen to 91%.  Pt was just ambulated in the room as oxygen demand was increasing very quickly.

## 2020-09-30 NOTE — Progress Notes (Signed)
HD#1 Subjective:  Overnight Events: None   Upon examination patient is sitting upright in bed, with no complaints at this time. He denies shortness of breath. Does endorse productive cough with phlegm that onset two evenings ago. Eating/drinking well. He has no other complaints at this time.   Objective:  Vital signs in last 24 hours: Vitals:   09/29/20 1608 09/29/20 2022 09/30/20 0000 09/30/20 0555  BP: 113/67 118/65 104/62 (!) 97/52  Pulse: 82 78 72 80  Resp: (!) 21 15 (!) 24 20  Temp: 99.5 F (37.5 C) 98.4 F (36.9 C) 98.1 F (36.7 C) 97.7 F (36.5 C)  TempSrc: Oral Oral Oral Oral  SpO2: 94% 97% 96% 94%  Weight:      Height:       Supplemental O2: Nasal Cannula SpO2: 94 % O2 Flow Rate (L/min): 1 L/min   Physical Exam:  Physical Exam Constitutional:      General: He is not in acute distress.    Appearance: He is ill-appearing. He is not toxic-appearing or diaphoretic.  Cardiovascular:     Rate and Rhythm: Normal rate and regular rhythm.     Pulses: Normal pulses.     Heart sounds: No murmur heard.  No gallop.   Pulmonary:     Effort: No respiratory distress.     Comments: Decreased breath sounds diffusely Abdominal:     General: Abdomen is flat.     Tenderness: There is no abdominal tenderness.  Skin:    General: Skin is warm and dry.  Neurological:     General: No focal deficit present.     Mental Status: He is alert and oriented to person, place, and time.  Psychiatric:        Mood and Affect: Mood normal.        Behavior: Behavior normal.    Filed Weights   09/29/20 0345  Weight: 65.3 kg    Intake/Output Summary (Last 24 hours) at 09/30/2020 0616 Last data filed at 09/30/2020 0500 Gross per 24 hour  Intake 1502.48 ml  Output 1790 ml  Net -287.52 ml   Net IO Since Admission: 1,501.82 mL [09/30/20 0616]  Pertinent Labs: CBC Latest Ref Rng & Units 09/30/2020 09/29/2020 09/28/2020  WBC 4.0 - 10.5 K/uL 6.9 5.0 7.6  Hemoglobin 13.0 - 17.0  g/dL 12.2(L) 12.3(L) 16.5  Hematocrit 39 - 52 % 36.0(L) 35.3(L) 51.0  Platelets 150 - 400 K/uL 448(H) 306 404(H)    CMP Latest Ref Rng & Units 09/30/2020 09/29/2020 09/28/2020  Glucose 70 - 99 mg/dL 93 169(C) -  BUN 8 - 23 mg/dL 78(L) 38(B) -  Creatinine 0.61 - 1.24 mg/dL 0.17(P) 1.02(H) -  Sodium 135 - 145 mmol/L 139 141 -  Potassium 3.5 - 5.1 mmol/L 5.0 4.2 4.7  Chloride 98 - 111 mmol/L 99 104 -  CO2 22 - 32 mmol/L 28 24 -  Calcium 8.9 - 10.3 mg/dL 9.2 8.5(I) -  Total Protein 6.5 - 8.1 g/dL 6.4(L) 6.3(L) -  Total Bilirubin 0.3 - 1.2 mg/dL 0.7 1.0 -  Alkaline Phos 38 - 126 U/L 49 37(L) -  AST 15 - 41 U/L 43(H) 30 -  ALT 0 - 44 U/L 22 17 -   Imaging: No results found.  Assessment/Plan:   Principal Problem:   AKI (acute kidney injury) (HCC) Active Problems:   Atrial flutter (HCC)   Cardiomyopathy (HCC)   COVID-19   Pneumonia due to COVID-19 virus   Patient Summary: Nathan Perez  is a 74 y.o. with a pertinent PMH of atrial flutter status post DCCV, hypertension, HFpEF with recovered ejection fraction, CVA who presented with generalized weakness, fatigue and dry cough and admitted for AKI and Covid pneumonia.  COVID pneumonia Tested positive on 10/13.  Saturating at 96% on 1 L CRP and D-dimer elevated.  Continue remdesivir. Started dexamethasone. Encouraged pulmonary rehab with use of incentive spirometer.   - Continue remdesivir (day 3/5), dexamethasone (day 1) - Trend CMP, inflammatory markers - O2 as need to keep >88, ambulate with nursing to see if O2 requirement.  - Early ambulation, incentive spirometry - Albuterol, Incruse andBreo Ellipta inhalers - PT to eval/treat   Acute Kidney Injury - Prerenal Cr 2.25>1.69, improving. Baseline 1.2.  - Restarting fluids, 2ml LR for 12 hours - F/u AM CMP  Atrial flutter s/p DCCV Patient continues to be in normal sinus rhythm.  Status post successful cardioversion in 10/2017.  - Continue Eliquis 5 mg twice daily -  Telemetry  HFrEF - Resolved Patient has a history of HFrEF with recovered ejection fraction, most recent echo showed EF of 60 to 65% in 2019.  Continuing home medications.   - Continue Entresto, metoprolol and chlorthalidone - Strict ins and outs - Will discontinue fluids if volume overloaded.   Diet: Heart Healthy IVF: LR,75cc/hr VTE: Eliquis Code: Full PT/OT recs: Pending, none.  Dispo: Anticipated discharge to Home in 2 days pending COVID treatment and resolution of AKI.   Thalia Bloodgood DO Internal Medicine Resident PGY-1 Pager [phone] Please contact the on call pager after 5 pm and on weekends at [phone].

## 2020-10-01 LAB — CBC WITH DIFFERENTIAL/PLATELET
Abs Immature Granulocytes: 0.05 10*3/uL (ref 0.00–0.07)
Basophils Absolute: 0 10*3/uL (ref 0.0–0.1)
Basophils Relative: 0 %
Eosinophils Absolute: 0 10*3/uL (ref 0.0–0.5)
Eosinophils Relative: 0 %
HCT: 36.4 % — ABNORMAL LOW (ref 39.0–52.0)
Hemoglobin: 11.8 g/dL — ABNORMAL LOW (ref 13.0–17.0)
Immature Granulocytes: 1 %
Lymphocytes Relative: 13 %
Lymphs Abs: 0.5 10*3/uL — ABNORMAL LOW (ref 0.7–4.0)
MCH: 30.8 pg (ref 26.0–34.0)
MCHC: 32.4 g/dL (ref 30.0–36.0)
MCV: 95 fL (ref 80.0–100.0)
Monocytes Absolute: 0.2 10*3/uL (ref 0.1–1.0)
Monocytes Relative: 6 %
Neutro Abs: 3.4 10*3/uL (ref 1.7–7.7)
Neutrophils Relative %: 80 %
Platelets: 499 10*3/uL — ABNORMAL HIGH (ref 150–400)
RBC: 3.83 MIL/uL — ABNORMAL LOW (ref 4.22–5.81)
RDW: 13.9 % (ref 11.5–15.5)
WBC: 4.2 10*3/uL (ref 4.0–10.5)
nRBC: 0 % (ref 0.0–0.2)

## 2020-10-01 LAB — COMPREHENSIVE METABOLIC PANEL
ALT: 21 U/L (ref 0–44)
AST: 29 U/L (ref 15–41)
Albumin: 2.7 g/dL — ABNORMAL LOW (ref 3.5–5.0)
Alkaline Phosphatase: 43 U/L (ref 38–126)
Anion gap: 12 (ref 5–15)
BUN: 31 mg/dL — ABNORMAL HIGH (ref 8–23)
CO2: 26 mmol/L (ref 22–32)
Calcium: 9.1 mg/dL (ref 8.9–10.3)
Chloride: 97 mmol/L — ABNORMAL LOW (ref 98–111)
Creatinine, Ser: 1.29 mg/dL — ABNORMAL HIGH (ref 0.61–1.24)
GFR, Estimated: 54 mL/min — ABNORMAL LOW (ref >60)
Glucose, Bld: 166 mg/dL — ABNORMAL HIGH (ref 70–99)
Potassium: 6.7 mmol/L (ref 3.5–5.1)
Sodium: 135 mmol/L (ref 135–145)
Total Bilirubin: 1 mg/dL (ref 0.3–1.2)
Total Protein: 6.4 g/dL — ABNORMAL LOW (ref 6.5–8.1)

## 2020-10-01 LAB — C-REACTIVE PROTEIN: CRP: 16.5 mg/dL — ABNORMAL HIGH (ref <1.0)

## 2020-10-01 LAB — FERRITIN: Ferritin: 261 ng/mL (ref 24–336)

## 2020-10-01 LAB — BASIC METABOLIC PANEL
Anion gap: 12 (ref 5–15)
BUN: 30 mg/dL — ABNORMAL HIGH (ref 8–23)
CO2: 27 mmol/L (ref 22–32)
Calcium: 9.4 mg/dL (ref 8.9–10.3)
Chloride: 96 mmol/L — ABNORMAL LOW (ref 98–111)
Creatinine, Ser: 1.31 mg/dL — ABNORMAL HIGH (ref 0.61–1.24)
GFR, Estimated: 53 mL/min — ABNORMAL LOW (ref >60)
Glucose, Bld: 144 mg/dL — ABNORMAL HIGH (ref 70–99)
Potassium: 4.6 mmol/L (ref 3.5–5.1)
Sodium: 135 mmol/L (ref 135–145)

## 2020-10-01 LAB — GLUCOSE, CAPILLARY
Glucose-Capillary: 151 mg/dL — ABNORMAL HIGH (ref 70–99)
Glucose-Capillary: 162 mg/dL — ABNORMAL HIGH (ref 70–99)
Glucose-Capillary: 206 mg/dL — ABNORMAL HIGH (ref 70–99)
Glucose-Capillary: 102 mg/dL — ABNORMAL HIGH (ref 70–99)

## 2020-10-01 LAB — D-DIMER, QUANTITATIVE: D-Dimer, Quant: 2.16 ug/mL-FEU — ABNORMAL HIGH (ref 0.00–0.50)

## 2020-10-01 MED ORDER — DEXTROSE 50 % IV SOLN: 1.0000 | Freq: Once | INTRAVENOUS | Status: DC

## 2020-10-01 MED ORDER — SODIUM ZIRCONIUM CYCLOSILICATE 10 G PO PACK: 10.0000 g | PACK | Freq: Once | ORAL | Status: DC

## 2020-10-01 MED ORDER — INSULIN ASPART 100 UNIT/ML ~~LOC~~ SOLN
0.0000 [IU] | Freq: Three times a day (TID) | SUBCUTANEOUS | Status: DC
  Administered 2020-10-01: 4 [IU] via SUBCUTANEOUS
  Administered 2020-10-01: 7 [IU] via SUBCUTANEOUS
  Administered 2020-10-02 (×2): 4 [IU] via SUBCUTANEOUS

## 2020-10-01 MED ORDER — INSULIN ASPART 100 UNIT/ML IV SOLN: 5.0000 [IU] | Freq: Once | INTRAVENOUS | Status: DC

## 2020-10-01 NOTE — Progress Notes (Signed)
Physical Therapy Treatment Patient Details Name: Nathan Perez MRN: [id number] DOB: [birth year] Today's Date: 10/01/2020    History of Present Illness Pt is a 74 y.o. male with recent (+) COVID-19 on 09/25/20, now admitted 09/28/20 with generalized weakness, cough, fatigue. Workup for AKI, COVID-19 PNA. PMH includes aflutter s/p DCCV, HTN, HFpEF, CVA.    PT Comments    Patient on room air throughout session with SpO2 90-95% while ambulating x 147ft with RW and able to maintain conversation. Patient able to walk in room without RW, however does reach out to hold onto surfaces. Will continue to assess need for DME prior to discharge.     Follow Up Recommendations  Home health PT;Supervision for mobility/OOB     Equipment Recommendations   (TBD--?RW however unlikely)    Recommendations for Other Services       Precautions / Restrictions Precautions Precautions: Fall Precaution Comments: Watch SpO2    Mobility  Bed Mobility Overal bed mobility: Modified Independent                Transfers Overall transfer level: Needs assistance Equipment used: Rolling walker (2 wheeled) Transfers: Sit to/from Stand Sit to Stand: Min guard         General transfer comment: Min Guard A and vc for safe ue of RW.  Ambulation/Gait Ambulation/Gait assistance: Min guard Gait Distance (Feet): 180 Feet Assistive device: Rolling walker (2 wheeled);None Gait Pattern/deviations: Step-through pattern;Decreased stride length;Trunk flexed Gait velocity: Decreased   General Gait Details: Pt initiated using RW and required cues x 1 for proximity to RW. On return to room he wanted to try to walk around end of bed with no device and did well.    Stairs             Wheelchair Mobility    Modified Rankin (Stroke Patients Only)       Balance Overall balance assessment: Needs assistance   Sitting balance-Leahy Scale: Good       Standing balance-Leahy Scale: Fair                               Cognition Arousal/Alertness: Awake/alert Behavior During Therapy: WFL for tasks assessed/performed Overall Cognitive Status: No family/caregiver present to determine baseline cognitive functioning Area of Impairment: Attention;Problem solving;Safety/judgement;Awareness                   Current Attention Level: Sustained   Following Commands: Follows multi-step commands inconsistently Safety/Judgement: Decreased awareness of deficits (he requested to use RW due to feeling a bit weak) Awareness: Anticipatory Problem Solving: Slow processing;Decreased initiation;Requires verbal cues General Comments: Easily persuaded to participate after initially refusing.       Exercises Other Exercises Other Exercises: Standing-knee flexion bil x 10 reps each prior to ambulating as pt reports it makes his knees feel better.     General Comments General comments (skin integrity, edema, etc.): SpO2 >90% walking on room air x 180 ft. Denied feeling short of breath      Pertinent Vitals/Pain Pain Assessment: No/denies pain    Home Living                      Prior Function            PT Goals (current goals can now be found in the care plan section) Acute Rehab PT Goals Patient Stated Goal: "Get better" Time For Goal Achievement: 10/13/20 Potential to  Achieve Goals: Good Progress towards PT goals: Progressing toward goals    Frequency    Min 3X/week      PT Plan Current plan remains appropriate    Co-evaluation              AM-PAC PT "6 Clicks" Mobility   Outcome Measure  Help needed turning from your back to your side while in a flat bed without using bedrails?: None Help needed moving from lying on your back to sitting on the side of a flat bed without using bedrails?: None Help needed moving to and from a bed to a chair (including a wheelchair)?: A Little Help needed standing up from a chair using your arms (e.g., wheelchair  or bedside chair)?: A Little Help needed to walk in hospital room?: A Little Help needed climbing 3-5 steps with a railing? : A Little 6 Click Score: 20    End of Session   Activity Tolerance: Patient tolerated treatment well Patient left: in chair;with call bell/phone within reach;with chair alarm set Nurse Communication: Mobility status PT Visit Diagnosis: Other abnormalities of gait and mobility (R26.89);Unsteadiness on feet (R26.81)     Time: 1015-1040 PT Time Calculation (min) (ACUTE ONLY): 25 min  Charges:  $Gait Training: 23-37 mins                      Jerolyn Center, PT Pager [phone]    Zena Amos 10/01/2020, 12:53 PM

## 2020-10-01 NOTE — Progress Notes (Signed)
SATURATION QUALIFICATIONS: (This note is used to comply with regulatory documentation for home oxygen)  Patient Saturations on Room Air at Rest = 96%  Patient Saturations on Room Air while Ambulating = 91%  Patient Saturations on 0 Liters of oxygen while Ambulating = 91%  Please briefly explain why patient needs home oxygen: This patient does not qualify for home oxygen.

## 2020-10-01 NOTE — Progress Notes (Signed)
HD#2 Subjective:  Overnight Events: None    Nathan Perez seen and evaluated at bedside this AM. States that he is doing well, and has not had to use his oxygen. He denies headaches, fevers, n/v, abdominal pain, constipation or diarrhea. We discussed discharge pending his O2 requirement what dispo would entail. Nathan Perez states that he lives in a group home with 28 other people. They share a hallway, common room, kitchen, and showers. He states that his wife does have a house here in [place] and that he could stay with her. He supplied her number, and we confirmed it is in our files. Will reach out to wife later today. All questions and concerns addressed.   Objective:  Vital signs in last 24 hours: Vitals:   09/30/20 1953 10/01/20 0000 10/01/20 0400 10/01/20 0715  BP: 112/72 (!) 103/58 104/73 110/64  Pulse: 69 67 64 66  Resp: 20 20 20 20   Temp: (!) 97.3 F (36.3 C) 97.7 F (36.5 C) 97.7 F (36.5 C) 98.6 F (37 C)  TempSrc: Oral Oral Oral Oral  SpO2: 100% 100% 100% 95%  Weight:      Height:       Supplemental O2: Room Air SpO2: 95 % O2 Flow Rate (L/min): 1 L/min   Physical Exam:  Physical Exam Cardiovascular:     Rate and Rhythm: Normal rate and regular rhythm.     Pulses: Normal pulses.     Heart sounds: Normal heart sounds. No murmur heard.  No friction rub. No gallop.   Pulmonary:     Effort: Pulmonary effort is normal.     Breath sounds: Normal breath sounds. No wheezing, rhonchi or rales.  Abdominal:     General: Abdomen is flat. Bowel sounds are normal.     Palpations: Abdomen is soft.     Tenderness: There is no abdominal tenderness. There is no guarding.  Musculoskeletal:        General: No swelling.     Right lower leg: No edema.     Left lower leg: No edema.  Neurological:     Mental Status: He is alert and oriented to person, place, and time.  Psychiatric:        Mood and Affect: Mood normal.        Behavior: Behavior normal.     Filed Weights     09/29/20 0345  Weight: 65.3 kg     Intake/Output Summary (Last 24 hours) at 10/01/2020 0733 Last data filed at 10/01/2020 0700 Gross per 24 hour  Intake 536.25 ml  Output 500 ml  Net 36.25 ml   Net IO Since Admission: 1,538.07 mL [10/01/20 0733]  Pertinent Labs: CBC Latest Ref Rng & Units 09/30/2020 09/29/2020 09/28/2020  WBC 4.0 - 10.5 K/uL 6.9 5.0 7.6  Hemoglobin 13.0 - 17.0 g/dL 12.2(L) 12.3(L) 16.5  Hematocrit 39 - 52 % 36.0(L) 35.3(L) 51.0  Platelets 150 - 400 K/uL 448(H) 306 404(H)    CMP Latest Ref Rng & Units 10/01/2020 09/30/2020 09/29/2020  Glucose 70 - 99 mg/dL 10/01/2020) 93 803(O)  BUN 8 - 23 mg/dL 122(Q) 82(N) 00(B)  Creatinine 0.61 - 1.24 mg/dL 70(W) 8.88(B) 1.69(I)  Sodium 135 - 145 mmol/L 135 139 141  Potassium 3.5 - 5.1 mmol/L 6.7(HH) 5.0 4.2  Chloride 98 - 111 mmol/L 97(L) 99 104  CO2 22 - 32 mmol/L 26 28 24   Calcium 8.9 - 10.3 mg/dL 9.1 9.2 5.03(U)  Total Protein 6.5 - 8.1 g/dL 6.4(L) 6.4(L) 6.3(L)  Total Bilirubin 0.3 - 1.2 mg/dL 1.0 0.7 1.0  Alkaline Phos 38 - 126 U/L 43 49 37(L)  AST 15 - 41 U/L 29 43(H) 30  ALT 0 - 44 U/L 21 22 17     Imaging: No results found.  Assessment/Plan:   Principal Problem:   AKI (acute kidney injury) (HCC) Active Problems:   Atrial flutter (HCC)   Cardiomyopathy (HCC)   COVID-19   Pneumonia due to COVID-19 virus   Patient Summary: Nathan Perez a 74 y.o.with a pertinent PMH of atrial flutter status post DCCV, hypertension, HFpEF with recovered ejection fraction, CVAwho presented with generalized weakness, fatigue and dry coughand admitted for AKI and Covid pneumonia.  COVID pneumonia Tested positive on 10/13. Saturating at 96% on 1 LCRP and D-dimer elevated. Continue remdesivir. Started dexamethasone.Encouraged pulmonary rehab with use of incentive spirometer. Looking better this AM. Off O2, saturating 88-92%. Will ambulate today, pending O2 requirements will DC to home if wife accepting.   -Continueremdesivir (day 4/5), dexamethasone (day 2) - Trend CMP, inflammatory markers - O2 as need to keep >88, ambulate with nursing for O2 saturations - Early ambulation, incentive spirometry - Albuterol, Incruse andBreo Ellipta inhalers - PT to eval/treat  Acute Kidney Injury - Prerenal Cr 2.25>1.69> 1.29>1.31, improving. Baseline 1.2. - F/u AM CMP  Atrial flutter s/p DCCV Patient continues to be in normal sinus rhythm. Status postsuccessful cardioversion in 10/2017.  -Continue Eliquis 5 mg twice daily -Telemetry  Hyperkalemia: (Resolved)  K of 6.7 with slight hemolysis drawn at 4:40 am. Repeat BMP, EKG, insulin w d5w, and lokelma ordered by day team upon seeing lab result. Repeat K of 4.6.  - Serial BMP  HFrEF - Resolved Patient has a history of HFrEF with recovered ejection fraction, most recent echo showed EF of 60 to 65% in 2019. Continuing home medications.  -Continue Entresto, metoprolol and chlorthalidone -Strict ins and outs - Will discontinue fluids if volume overloaded.   Diet: Heart Healthy IVF: LR,75cc/hr VTE: Eliquis Code: Full PT/OT recs: Pending, none. Dispo: Anticipated discharge to Home in 1 days pending reduced oxygen requirement.   2020 DO Internal Medicine Resident PGY-1 Pager [phone] Please contact the on call pager after 5 pm and on weekends at [phone].

## 2020-10-01 NOTE — Progress Notes (Signed)
CSW unable to reach patient's spouse (no vm set up). CSW left voicemail for patient's daughter to discuss discharge plans.   Brenley Priore LCSW

## 2020-10-02 DIAGNOSIS — I110 Hypertensive heart disease with heart failure: Secondary | ICD-10-CM

## 2020-10-02 LAB — HEMOGLOBIN A1C
Hgb A1c MFr Bld: 5.9 % — ABNORMAL HIGH (ref 4.8–5.6)
Mean Plasma Glucose: 123 mg/dL

## 2020-10-02 LAB — CBC WITH DIFFERENTIAL/PLATELET
Abs Immature Granulocytes: 0.11 10*3/uL — ABNORMAL HIGH (ref 0.00–0.07)
Basophils Absolute: 0 10*3/uL (ref 0.0–0.1)
Basophils Relative: 0 %
Eosinophils Absolute: 0 10*3/uL (ref 0.0–0.5)
Eosinophils Relative: 0 %
HCT: 36.2 % — ABNORMAL LOW (ref 39.0–52.0)
Hemoglobin: 11.7 g/dL — ABNORMAL LOW (ref 13.0–17.0)
Immature Granulocytes: 1 %
Lymphocytes Relative: 7 %
Lymphs Abs: 0.7 10*3/uL (ref 0.7–4.0)
MCH: 30.8 pg (ref 26.0–34.0)
MCHC: 32.3 g/dL (ref 30.0–36.0)
MCV: 95.3 fL (ref 80.0–100.0)
Monocytes Absolute: 0.7 10*3/uL (ref 0.1–1.0)
Monocytes Relative: 7 %
Neutro Abs: 9.2 10*3/uL — ABNORMAL HIGH (ref 1.7–7.7)
Neutrophils Relative %: 85 %
Platelets: 627 10*3/uL — ABNORMAL HIGH (ref 150–400)
RBC: 3.8 MIL/uL — ABNORMAL LOW (ref 4.22–5.81)
RDW: 13.7 % (ref 11.5–15.5)
WBC: 10.8 10*3/uL — ABNORMAL HIGH (ref 4.0–10.5)
nRBC: 0 % (ref 0.0–0.2)

## 2020-10-02 LAB — FERRITIN: Ferritin: 235 ng/mL (ref 24–336)

## 2020-10-02 LAB — COMPREHENSIVE METABOLIC PANEL
ALT: 20 U/L (ref 0–44)
AST: 30 U/L (ref 15–41)
Albumin: 2.7 g/dL — ABNORMAL LOW (ref 3.5–5.0)
Alkaline Phosphatase: 47 U/L (ref 38–126)
Anion gap: 11 (ref 5–15)
BUN: 34 mg/dL — ABNORMAL HIGH (ref 8–23)
CO2: 26 mmol/L (ref 22–32)
Calcium: 9.4 mg/dL (ref 8.9–10.3)
Chloride: 98 mmol/L (ref 98–111)
Creatinine, Ser: 1.29 mg/dL — ABNORMAL HIGH (ref 0.61–1.24)
GFR, Estimated: 54 mL/min — ABNORMAL LOW (ref >60)
Glucose, Bld: 105 mg/dL — ABNORMAL HIGH (ref 70–99)
Potassium: 5 mmol/L (ref 3.5–5.1)
Sodium: 135 mmol/L (ref 135–145)
Total Bilirubin: 0.9 mg/dL (ref 0.3–1.2)
Total Protein: 6.3 g/dL — ABNORMAL LOW (ref 6.5–8.1)

## 2020-10-02 LAB — GLUCOSE, CAPILLARY
Glucose-Capillary: 112 mg/dL — ABNORMAL HIGH (ref 70–99)
Glucose-Capillary: 179 mg/dL — ABNORMAL HIGH (ref 70–99)
Glucose-Capillary: 151 mg/dL — ABNORMAL HIGH (ref 70–99)

## 2020-10-02 LAB — C-REACTIVE PROTEIN: CRP: 10.5 mg/dL — ABNORMAL HIGH (ref <1.0)

## 2020-10-02 LAB — D-DIMER, QUANTITATIVE: D-Dimer, Quant: 1.57 ug/mL-FEU — ABNORMAL HIGH (ref 0.00–0.50)

## 2020-10-02 NOTE — Care Management Important Message (Signed)
Important Message  Patient Details  Name: Nathan Perez MRN: [id number] Date of Birth: 11/21/1946   Medicare Important Message Given:  Yes - Important Message mailed due to current National Emergency  Verbal consent obtained due to current National Emergency  Relationship to patient: Self Contact Name: [name] Call Date: 10/02/20  Time: 1024 Phone: [phone] Outcome: Spoke with contact Important Message mailed to: Patient address on file    Orson Aloe 10/02/2020, 10:24 AM

## 2020-10-02 NOTE — Discharge Summary (Signed)
Name: Nathan Perez MRN: [id number] DOB: 1946-08-22 74 y.o. PCP: [facility]  Date of Admission: 09/28/2020  1:49 PM Date of Discharge: 10/02/2020 Attending Physician: Dr. Cleda Daub  Discharge Diagnosis: Principal Problem:   AKI (acute kidney injury) [facility]) Active Problems:   Atrial flutter (HCC)   Cardiomyopathy (HCC)   COVID-19   Pneumonia due to COVID-19 virus    Discharge Medications: Allergies as of 10/02/2020   No Known Allergies     Medication List    TAKE these medications   aspirin 81 MG EC tablet Take 1 tablet (81 mg total) by mouth daily.   atorvastatin 20 MG tablet Commonly known as: LIPITOR Take 1 tablet (20 mg total) by mouth at bedtime.   chlorthalidone 25 MG tablet Commonly known as: HYGROTON Take 0.5 tablets (12.5 mg total) by mouth daily.   Eliquis 5 MG Tabs tablet Generic drug: apixaban TAKE 1 TABLET(5 MG) BY MOUTH TWICE DAILY What changed: See the new instructions.   metoprolol succinate 25 MG 24 hr tablet Commonly known as: TOPROL-XL TAKE 1 TABLET BY MOUTH DAILY   sacubitril-valsartan 97-103 MG Commonly known as: Entresto Take 1 tablet by mouth 2 (two) times daily.   Spiriva Respimat 2.5 MCG/ACT Aers Generic drug: Tiotropium Bromide Monohydrate Inhale 2 puffs into the lungs daily.   Trelegy Ellipta 100-62.5-25 MCG/INH Aepb Generic drug: Fluticasone-Umeclidin-Vilant Inhale 2 puffs into the lungs in the morning and at bedtime.   Ventolin HFA 108 (90 Base) MCG/ACT inhaler Generic drug: albuterol Inhale 2 puffs into the lungs every 4 (four) hours as needed for wheezing or shortness of breath (cough).            Durable Medical Equipment  (From admission, onward)         Start     Ordered   10/02/20 1120  DME Walker  Once       Question Answer Comment  Walker: With 5 Inch Wheels   Patient needs a walker to treat with the following condition Weakness      10/02/20 1120          Disposition and  follow-up:   Mr.Sricharan L Huq was discharged from [facility] in Stable condition.  At the hospital follow up visit please address:  1.  Follow-up:  a.  Acute kidney injury-likely prerenal in nature, presented with creatinine of 2.25 which  improved to 1.3 at discharge.  Recommend repeat BMP at follow-up.    b.  COVID-19-patient to quarantine until 10/05/2020.  We will need to discuss Covid  Vaccination.   2.  Labs / imaging needed at time of follow-up: BMP  3.  Pending labs/ test needing follow-up: none  Follow-up Appointments:  Follow-up Information    [facility] Follow up.   Specialty: Home Health Services Why: for home health services Contact information: [address] [place] [address] [phone]        [facility] Follow up in 1 week(s).   Specialty: Family Medicine Contact information: [address] [place] [address] [phone]               Hospital Course by problem list: 1.  AKI-patient presented with creatinine of 2.25, likely prerenal.  UA and clinical exam consistent with dehydration. Improved with gentle hydration.  Creatinine 1.3 at time of discharge.  2.  Covid pneumonia-patient presented with generalized weakness and dry cough.  Tested positive for Covid on 09/25/2020.  Chest x-ray showed pulmonary infiltrate likely due to Covid pneumonia.  Initially did not require any supplemental oxygen at the time of admission however required up to 3 L nasal cannula.  Was treated with 5 days of remdesivir and 3 days of dexamethasone.  Did not require any supplemental oxygen at the time of discharge. PT evaluated and recommended home health PT. Discharged with HHPT and rolling walker with 5'' wheels.   3.  Hyperkalemia- K as high as 6.7, with slight hemolysis. Was treated with insulin with D5W and lokelma. Repeat K 4.6. Follow up BMP at hospital visit follow up.   4.  Atrial Flutter s/p DCCV- patient was in normal sinus rhythm during hospital stay.  Status post successful cardioversion in 2018.  Continued on Eliquis 5 mg twice daily.  5.  HFrEF-with recovered ejection fraction most recent echo showed EF of 60 to 65% 2019.  Continued on Entrest,o metoprolol and chlorthalidone during hospital stay.   Discharge Vitals:   BP (!) 102/58 (BP Location: Right Arm)    Pulse 70    Temp 97.9 F (36.6 C) (Oral)    Resp 18    Ht 6\' 1"  (1.854 m)    Wt 65.3 kg    SpO2 100%    BMI 18.99 kg/m   Pertinent Labs, Studies, and Procedures:  CBC Latest Ref Rng & Units 10/02/2020 10/01/2020 09/30/2020  WBC 4.0 - 10.5 K/uL 10.8(H) 4.2 6.9  Hemoglobin 13.0 - 17.0 g/dL 11.7(L) 11.8(L) 12.2(L)  Hematocrit 39 - 52 % 36.2(L) 36.4(L) 36.0(L)  Platelets 150 - 400 K/uL 627(H) 499(H) 448(H)    CMP Latest Ref Rng & Units 10/02/2020 10/01/2020 10/01/2020  Glucose 70 - 99 mg/dL 10/03/2020) 782(N) 562(Z)  BUN 8 - 23 mg/dL 308(M) 57(Q) 46(N)  Creatinine 0.61 - 1.24 mg/dL 62(X) 5.28(U) 1.32(G)  Sodium 135 - 145 mmol/L 135 135 135  Potassium 3.5 - 5.1 mmol/L 5.0 4.6 6.7(HH)  Chloride 98 - 111 mmol/L 98 96(L) 97(L)  CO2 22 - 32 mmol/L 26 27 26   Calcium 8.9 - 10.3 mg/dL 9.4 9.4 9.1  Total Protein 6.5 - 8.1 g/dL 6.3(L) - 6.4(L)  Total Bilirubin 0.3 - 1.2 mg/dL 0.9 - 1.0  Alkaline Phos 38 - 126 U/L 47 - 43  AST 15 - 41 U/L 30 - 29  ALT 0 - 44 U/L 20 - 21    DG Chest Portable 1 View  Result Date: 09/28/2020 CLINICAL DATA:  Cough.  COVID. EXAM: PORTABLE CHEST 1 VIEW COMPARISON:  November 18, 2017 FINDINGS: Mild opacity in left base is new. Opacity in the right base is somewhat platelike and new as well. Hyperinflation of the lungs. The heart, hila, mediastinum, lungs, and pleura are otherwise unremarkable. No pneumothorax. IMPRESSION: 1. Left greater than right bibasilar pulmonary infiltrates are identified. I suspect the infiltrate on the left is due to the patient's reported COVID-19. The  opacity on the right is somewhat platelike and could be due to developing pneumonia versus atelectasis. Recommend attention on follow-up. Electronically Signed   By: 09/30/2020 III M.D   On: 09/28/2020 18:20     Discharge Instructions:  Thank you for allowing [facility] to care for you during your hospitalization. I am glad you are feeling better. You were diagnosed with an acute kidney injury, meaning your kidneys were not working as well. We believe this was due to dehydration.   You required COVID treatment for a few days and will not  continue this once you are discharged. Please quarantine for three more days,  Stop your quarantine on 10/05/20    Discharge Instructions    Call MD for:  difficulty breathing, headache or visual disturbances   Complete by: As directed    Call MD for:  extreme fatigue   Complete by: As directed    Call MD for:  persistant dizziness or light-headedness   Complete by: As directed    Call MD for:  persistant nausea and vomiting   Complete by: As directed    Call MD for:  redness, tenderness, or signs of infection (pain, swelling, redness, odor or green/yellow discharge around incision site)   Complete by: As directed    Call MD for:  severe uncontrolled pain   Complete by: As directed    Call MD for:  temperature >100.4   Complete by: As directed    Diet - low sodium heart healthy   Complete by: As directed    Increase activity slowly   Complete by: As directed       Signed: Chivas Notz N, DO 10/02/2020, 11:40 AM

## 2020-10-02 NOTE — Progress Notes (Signed)
HD#3 Subjective:  Overnight Events: No acute overnight   Patient is doing well, ambulating with walker with PT assistance. Physical therapy state patient ambulated well with walker and did not have hypoxia. He states he feels well enough to go home. Spoke with patient's wife who states he can stay at the house and finish his COVID quarantine. They have enough bedrooms for him to be separate from the rest of the family and the household has been vaccinated.   No other complaints or concerns at the time of my examination.   Objective:  Vital signs in last 24 hours: Vitals:   10/01/20 1600 10/01/20 2021 10/01/20 2348 10/02/20 0430  BP: 106/65 (!) 97/47 100/84 (!) 99/57  Pulse: 69 68 62 62  Resp: 20 15 18 20   Temp: 98.1 F (36.7 C) 97.7 F (36.5 C) 97.7 F (36.5 C) 97.6 F (36.4 C)  TempSrc: Oral Oral Oral Oral  SpO2: 100% 100% 99% 100%  Weight:      Height:       Supplemental O2: Room Air  Physical Exam:  Physical Exam Vitals and nursing note reviewed.  Cardiovascular:     Rate and Rhythm: Normal rate and regular rhythm.     Pulses: Normal pulses.     Heart sounds: Normal heart sounds. No murmur heard.   Pulmonary:     Effort: No respiratory distress.  Skin:    General: Skin is warm and dry.  Neurological:     General: No focal deficit present.     Mental Status: He is alert and oriented to person, place, and time. Mental status is at baseline.  Psychiatric:        Mood and Affect: Mood normal.        Behavior: Behavior normal.     Filed Weights   09/29/20 0345  Weight: 65.3 kg    Intake/Output Summary (Last 24 hours) at 10/02/2020 0625 Last data filed at 10/02/2020 0454 Gross per 24 hour  Intake 360 ml  Output 1750 ml  Net -1390 ml   Net IO Since Admission: 298.07 mL [10/02/20 0625]  Pertinent Labs: CBC Latest Ref Rng & Units 10/02/2020 10/01/2020 09/30/2020  WBC 4.0 - 10.5 K/uL 10.8(H) 4.2 6.9  Hemoglobin 13.0 - 17.0 g/dL 11.7(L) 11.8(L) 12.2(L)    Hematocrit 39 - 52 % 36.2(L) 36.4(L) 36.0(L)  Platelets 150 - 400 K/uL 627(H) 499(H) 448(H)    CMP Latest Ref Rng & Units 10/02/2020 10/01/2020 10/01/2020  Glucose 70 - 99 mg/dL 10/03/2020) 191(Y) 782(N)  BUN 8 - 23 mg/dL 562(Z) 30(Q) 65(H)  Creatinine 0.61 - 1.24 mg/dL 84(O) 9.62(X) 5.28(U)  Sodium 135 - 145 mmol/L 135 135 135  Potassium 3.5 - 5.1 mmol/L 5.0 4.6 6.7(HH)  Chloride 98 - 111 mmol/L 98 96(L) 97(L)  CO2 22 - 32 mmol/L 26 27 26   Calcium 8.9 - 10.3 mg/dL 9.4 9.4 9.1  Total Protein 6.5 - 8.1 g/dL 6.3(L) - 6.4(L)  Total Bilirubin 0.3 - 1.2 mg/dL 0.9 - 1.0  Alkaline Phos 38 - 126 U/L 47 - 43  AST 15 - 41 U/L 30 - 29  ALT 0 - 44 U/L 20 - 21    Imaging: No results found.  Assessment/Plan:   Principal Problem:   AKI (acute kidney injury) (HCC) Active Problems:   Atrial flutter (HCC)   Cardiomyopathy (HCC)   COVID-19   Pneumonia due to COVID-19 virus   Patient Summary: Nathan Perez a 74 y.o.with a pertinent PMH of  atrial flutter status post DCCV, hypertension, HFpEF with recovered ejection fraction, CVAwho presented with generalized weakness, fatigue and dry coughand admitted for AKI and Covid pneumonia.  10/02/20 Patient improving overall, not requiring oxygen supplementation at rest or with ambulation. Patient unable to be discharged to community home due to COVID infection. In the hospital the patient completed remdesivir treatment. AKI resolved. Will be discharged to stay with his wife and finish his quarantine there. Patient hemodynamically stable at discharge and able to ambulate with walker. Will discharge home with home health PT and walker.   COVID pneumonia Tested positive on 10/13. Saturating at100% RA and D-dimer decreasing. Continue remdesivir. Continue dexamethasone.Encouraged pulmonary rehab with use of incentive spirometer. Ambulating without O2 requirements. DC to home if wife accepting.  -Completed remdesivir - Quarantine until  10/05/20  Acute Kidney Injury - Prerenal Cr 2.25>1.69> 1.29>1.31>1.29, improving. Baseline 1.2. -encouraged patient to drink fluids after discharge -will follow up PCP after discharged  Diet:Heart Healthy OQH:UTML YYT:KPTWSFK Code:Full PT/OT recs:Home health PT, walker Dispo: Anticipated discharge to wife's home today  Thalia Bloodgood DO Internal Medicine Resident PGY-1 Pager [phone] Please contact the on call pager after 5 pm and on weekends at [phone].

## 2020-10-02 NOTE — TOC Initial Note (Addendum)
Transition of Care [facility]) - Initial/Assessment Note    Patient Details  Name: Nathan Perez MRN: [id number] Date of Birth: [birth year]  [facility] CM/SW Contact:    Lawerance Sabal, RN Phone Number: 10/02/2020, 9:59 AM  Clinical Narrative:        Could not reach patient on the room phone. Spoke with his wife (on second back to back call she answered). She provides that he lives at [facility] house, [address] in [place]. She states that he lives there with their son Feliz Beam [phone]). 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 She states they are married but have lived apart for the past 40 years, they get along and help each other out. She states that either she or her daughter will be able to provide transportation home.  HH services set up with [facility].          RW ordered through [employer] to be delivered to room prior to DC.      Expected Discharge Plan: Home w Home Health Services Barriers to Discharge: Continued Medical Work up   Patient Goals and CMS Choice Patient states their goals for this hospitalization and ongoing recovery are:: to go home CMS Medicare.gov Compare Post Acute Care list provided to:: Other (Comment Required) Choice offered to / list presented to : Spouse  Expected Discharge Plan and Services Expected Discharge Plan: Home w Home Health Services   Discharge Planning Services: CM Consult Post Acute Care Choice: Home Health Living arrangements for the past 2 months: Boarding House                           HH Arranged: PT HH Agency: [facility] Date [facility] Agency Contacted: 10/02/20 Time HH Agency Contacted: [phone] Representative spoke with at [facility] Agency: Kandee Keen  Prior Living Arrangements/Services Living arrangements for the past 2 months: [employer] Lives with:: Adult Children                    Activities of Daily Living Home Assistive Devices/Equipment: None ADL Screening (condition at time of admission) Patient's cognitive ability adequate to safely complete daily activities?: Yes Is the patient deaf or have difficulty hearing?: No Does the patient have difficulty seeing, even when wearing glasses/contacts?: No Does the patient have difficulty concentrating, remembering, or making decisions?: No Patient able to express need for assistance with ADLs?: Yes Does the patient have difficulty dressing or bathing?: No Independently performs ADLs?: Yes (appropriate for developmental age) Does the patient have difficulty walking or climbing stairs?: Yes Weakness of Legs: Both Weakness of Arms/Hands: Both  Permission Sought/Granted                  Emotional Assessment              Admission diagnosis:  Dehydration [E86.0] Acute renal injury (HCC) [N17.9] COVID-19 [U07.1] Pneumonia due to COVID-19 virus [U07.1, J12.82] Patient Active Problem List   Diagnosis Date Noted  . AKI (acute kidney injury) (HCC) 09/29/2020  . Pneumonia due to COVID-19 virus 09/29/2020  . COVID-19 09/28/2020  . CRI (chronic renal insufficiency), stage 3 (moderate) (HCC) 06/28/2018  . History of CVA (cerebrovascular accident) 04/25/2018  . Atrial flutter by electrocardiogram (HCC) 11/08/2017  . History of cardioversion 11/08/2017  . Abnormal nuclear cardiac imaging test 10/18/2017  .  Cardiomyopathy (HCC) 10/18/2017  . Anticoagulated 10/18/2017  . Chronic systolic congestive heart failure (HCC)   . Essential hypertension 09/09/2017  . Atrial flutter (HCC) 09/09/2017  . TIA (transient ischemic attack) 09/09/2017  . Acute CVA (cerebrovascular accident) (HCC) 09/09/2017  . Dyslipidemia 09/09/2017  . Dysphagia   . Facial droop   . Fall   . Smoker 11/26/2009  . BRONCHITIS, CHRONIC 11/26/2009  . INGUINAL HERNIA, LEFT 11/26/2009  . DUODENAL ULCER, HX OF 11/26/2009   PCP:  [facility] Pharmacy:   [facility] [phone] - [place], [place] - [address] AT [facility] [address] [place] [address] Phone: [phone] Fax: [phone]     Social Determinants of Health (SDOH) Interventions    Readmission Risk Interventions No flowsheet data found.

## 2020-10-02 NOTE — Progress Notes (Signed)
Physical Therapy Treatment Patient Details Name: Nathan Perez MRN: [id number] DOB: 1946/06/16 Today's Date: 10/02/2020    History of Present Illness Pt is a 74 y.o. male with recent (+) COVID-19 on 09/25/20, now admitted 09/28/20 with generalized weakness, cough, fatigue. Workup for AKI, COVID-19 PNA. PMH includes aflutter s/p DCCV, HTN, HFpEF, CVA.    PT Comments    Patient ambulated without and with RW in simulated home setup. Without RW he was reaching for UE support on counters, foot board, etc. He was agreeable to use RW on discharge to decr his risk of falling. Sats remained >93% on room air throughout session. MD in at end of session and spoke to pt's wife by phone with plan established to discharge to her home with HHPT.     Follow Up Recommendations  Home health PT;Supervision for mobility/OOB     Equipment Recommendations  Rolling walker with 5" wheels    Recommendations for Other Services       Precautions / Restrictions Precautions Precautions: Fall Precaution Comments: Watch SpO2    Mobility  Bed Mobility Overal bed mobility: Modified Independent                Transfers Overall transfer level: Needs assistance Equipment used: Rolling walker (2 wheeled) Transfers: Sit to/from Stand Sit to Stand: Min guard         General transfer comment: Min Guard A and vc for safe ue of RW.  Ambulation/Gait Ambulation/Gait assistance: Min guard;Min assist Gait Distance (Feet): 100 Feet (no device (in room, home simulation); 75 ft RW) Assistive device: Rolling walker (2 wheeled);None Gait Pattern/deviations: Step-through pattern;Decreased stride length;Trunk flexed Gait velocity: Decreased   General Gait Details: no gross imbalance when not using RW, however reaching for light UE support; educated on tight turns and going thru narrow spaces with RW sideways with min assist to maneuver RW   Stairs             Wheelchair Mobility    Modified Rankin  (Stroke Patients Only)       Balance Overall balance assessment: Needs assistance   Sitting balance-Leahy Scale: Good       Standing balance-Leahy Scale: Fair Standing balance comment: stood at sink washing hands                             Cognition Arousal/Alertness: Awake/alert Behavior During Therapy: WFL for tasks assessed/performed Overall Cognitive Status: Within Functional Limits for tasks assessed                                 General Comments: Good awareness of lines; wanted to try walking without RW, however agreed he was tending to reach out for support and agreed should use RW for now.       Exercises      General Comments General comments (skin integrity, edema, etc.): sats 93% or greater on RA      Pertinent Vitals/Pain Pain Assessment: No/denies pain    Home Living                      Prior Function            PT Goals (current goals can now be found in the care plan section) Acute Rehab PT Goals Patient Stated Goal: "Get better" Time For Goal Achievement: 10/13/20 Potential to Achieve Goals: Good  Progress towards PT goals: Progressing toward goals    Frequency    Min 3X/week      PT Plan Current plan remains appropriate    Co-evaluation              AM-PAC PT "6 Clicks" Mobility   Outcome Measure  Help needed turning from your back to your side while in a flat bed without using bedrails?: None Help needed moving from lying on your back to sitting on the side of a flat bed without using bedrails?: None Help needed moving to and from a bed to a chair (including a wheelchair)?: A Little Help needed standing up from a chair using your arms (e.g., wheelchair or bedside chair)?: A Little Help needed to walk in hospital room?: A Little Help needed climbing 3-5 steps with a railing? : A Little 6 Click Score: 20    End of Session   Activity Tolerance: Patient tolerated treatment well Patient  left: in chair;with call bell/phone within reach;with chair alarm set Nurse Communication: Mobility status PT Visit Diagnosis: Other abnormalities of gait and mobility (R26.89);Unsteadiness on feet (R26.81)     Time: 8309-4076 PT Time Calculation (min) (ACUTE ONLY): 30 min  Charges:  $Gait Training: 23-37 mins                      Jerolyn Center, PT Pager [phone]    Zena Amos 10/02/2020, 10:24 AM

## 2020-10-16 ENCOUNTER — Other Ambulatory Visit: Payer: Medicare HMO

## 2020-10-16 DIAGNOSIS — Z20822 Contact with and (suspected) exposure to covid-19: Secondary | ICD-10-CM

## 2020-10-17 LAB — NOVEL CORONAVIRUS, NAA: SARS-CoV-2, NAA: NOT DETECTED

## 2020-10-17 LAB — SARS-COV-2, NAA 2 DAY TAT

## 2020-10-18 ENCOUNTER — Ambulatory Visit: Payer: Self-pay | Admitting: *Deleted

## 2020-10-18 NOTE — Telephone Encounter (Signed)
Pt wife , Darel Hong called and notified of negative COVID-19 results. Understanding verbalized.

## 2020-11-05 ENCOUNTER — Telehealth: Payer: Self-pay | Admitting: Cardiovascular Disease

## 2020-11-05 NOTE — Telephone Encounter (Signed)
*  STAT* If patient is at the pharmacy, call can be transferred to refill team.   1. Which medications need to be refilled? (please list name of each medication and dose if known)  atorvastatin (LIPITOR) 20 MG tablet metoprolol succinate (TOPROL-XL) 25 MG 24 hr tablet sacubitril-valsartan (ENTRESTO) 97-103 MG  2. Which pharmacy/location (including street and city if local pharmacy) is medication to be sent to? [facility] - [place], [place] - [address]  3. Do they need a 30 day or 90 day supply? 90 with refills  This will be the first time the patient will be using the mail order service. The rx at the local [facility] pharmacy did not have refills that could be transferred to the mail order service

## 2020-11-06 MED ORDER — ENTRESTO 97-103 MG PO TABS: 1.0000 | ORAL_TABLET | Freq: Two times a day (BID) | ORAL | 0 refills | Status: AC

## 2020-11-06 MED ORDER — METOPROLOL SUCCINATE ER 25 MG PO TB24
25.0000 mg | ORAL_TABLET | Freq: Every day | ORAL | 0 refills | Status: DC
Start: 2020-11-06 — End: 2021-01-20

## 2020-11-06 MED ORDER — ATORVASTATIN CALCIUM 20 MG PO TABS
20.0000 mg | ORAL_TABLET | Freq: Every day | ORAL | 0 refills | Status: DC
Start: 2020-11-06 — End: 2021-02-06

## 2021-01-14 ENCOUNTER — Other Ambulatory Visit: Payer: Self-pay | Admitting: Cardiovascular Disease

## 2021-01-18 ENCOUNTER — Other Ambulatory Visit: Payer: Self-pay | Admitting: Cardiovascular Disease

## 2021-02-06 ENCOUNTER — Other Ambulatory Visit: Payer: Self-pay | Admitting: Cardiovascular Disease

## 2021-09-04 ENCOUNTER — Other Ambulatory Visit: Payer: Self-pay | Admitting: Internal Medicine

## 2021-09-05 LAB — COMPLETE METABOLIC PANEL WITH GFR
AG Ratio: 1.8 (calc) (ref 1.0–2.5)
ALT: 11 U/L (ref 9–46)
AST: 18 U/L (ref 10–35)
Albumin: 4.6 g/dL (ref 3.6–5.1)
Alkaline phosphatase (APISO): 57 U/L (ref 35–144)
BUN/Creatinine Ratio: 14 (calc) (ref 6–22)
BUN: 19 mg/dL (ref 7–25)
CO2: 25 mmol/L (ref 20–32)
Calcium: 10 mg/dL (ref 8.6–10.3)
Chloride: 104 mmol/L (ref 98–110)
Creat: 1.38 mg/dL — ABNORMAL HIGH (ref 0.70–1.28)
Globulin: 2.6 g/dL (calc) (ref 1.9–3.7)
Glucose, Bld: 79 mg/dL (ref 65–99)
Potassium: 5.1 mmol/L (ref 3.5–5.3)
Sodium: 140 mmol/L (ref 135–146)
Total Bilirubin: 0.5 mg/dL (ref 0.2–1.2)
Total Protein: 7.2 g/dL (ref 6.1–8.1)
eGFR: 53 mL/min/{1.73_m2} — ABNORMAL LOW (ref >=60)

## 2021-09-05 LAB — CBC
HCT: 40.9 % (ref 38.5–50.0)
Hemoglobin: 12.7 g/dL — ABNORMAL LOW (ref 13.2–17.1)
MCH: 27.6 pg (ref 27.0–33.0)
MCHC: 31.1 g/dL — ABNORMAL LOW (ref 32.0–36.0)
MCV: 88.9 fL (ref 80.0–100.0)
MPV: 10.8 fL (ref 7.5–12.5)
Platelets: 322 10*3/uL (ref 140–400)
RBC: 4.6 10*6/uL (ref 4.20–5.80)
RDW: 15.5 % — ABNORMAL HIGH (ref 11.0–15.0)
WBC: 5.9 10*3/uL (ref 3.8–10.8)

## 2021-09-05 LAB — LIPID PANEL
Cholesterol: 162 mg/dL (ref <200)
HDL: 73 mg/dL (ref >=40)
LDL Cholesterol (Calc): 75 mg/dL (calc)
Non-HDL Cholesterol (Calc): 89 mg/dL (calc) (ref <130)
Total CHOL/HDL Ratio: 2.2 (calc) (ref <5.0)
Triglycerides: 64 mg/dL (ref <150)

## 2021-09-05 LAB — TSH: TSH: 1.84 mIU/L (ref 0.40–4.50)

## 2022-05-07 ENCOUNTER — Telehealth: Payer: Self-pay

## 2022-05-07 NOTE — Telephone Encounter (Signed)
NOTES SCANNED TO REFERRAL 

## 2022-06-08 ENCOUNTER — Ambulatory Visit: Payer: Medicare HMO | Admitting: Cardiovascular Disease

## 2022-06-08 ENCOUNTER — Encounter: Payer: Self-pay | Admitting: Cardiovascular Disease

## 2022-06-08 VITALS — BP 122/60 | HR 64 | Ht 73.0 in | Wt 150.8 lb

## 2022-06-08 DIAGNOSIS — I10 Essential (primary) hypertension: Secondary | ICD-10-CM | POA: Diagnosis not present

## 2022-06-08 DIAGNOSIS — I4892 Unspecified atrial flutter: Secondary | ICD-10-CM | POA: Diagnosis not present

## 2022-06-08 DIAGNOSIS — I5042 Chronic combined systolic (congestive) and diastolic (congestive) heart failure: Secondary | ICD-10-CM

## 2022-06-08 MED ORDER — ATORVASTATIN CALCIUM 20 MG PO TABS: ORAL_TABLET | ORAL | 3 refills | Status: AC

## 2022-06-08 MED ORDER — METOPROLOL SUCCINATE ER 25 MG PO TB24: ORAL_TABLET | ORAL | 3 refills | Status: AC

## 2022-09-08 ENCOUNTER — Other Ambulatory Visit: Payer: Self-pay | Admitting: Internal Medicine

## 2022-09-09 LAB — LIPID PANEL
Cholesterol: 158 mg/dL (ref <200)
HDL: 56 mg/dL (ref >=40)
LDL Cholesterol (Calc): 80 mg/dL (calc)
Non-HDL Cholesterol (Calc): 102 mg/dL (calc) (ref <130)
Total CHOL/HDL Ratio: 2.8 (calc) (ref <5.0)
Triglycerides: 121 mg/dL (ref <150)

## 2022-09-09 LAB — COMPLETE METABOLIC PANEL WITH GFR
AG Ratio: 1.6 (calc) (ref 1.0–2.5)
ALT: 10 U/L (ref 9–46)
AST: 15 U/L (ref 10–35)
Albumin: 4 g/dL (ref 3.6–5.1)
Alkaline phosphatase (APISO): 48 U/L (ref 35–144)
BUN/Creatinine Ratio: 8 (calc) (ref 6–22)
BUN: 12 mg/dL (ref 7–25)
CO2: 24 mmol/L (ref 20–32)
Calcium: 9 mg/dL (ref 8.6–10.3)
Chloride: 108 mmol/L (ref 98–110)
Creat: 1.47 mg/dL — ABNORMAL HIGH (ref 0.70–1.28)
Globulin: 2.5 g/dL (calc) (ref 1.9–3.7)
Glucose, Bld: 99 mg/dL (ref 65–99)
Potassium: 4.1 mmol/L (ref 3.5–5.3)
Sodium: 142 mmol/L (ref 135–146)
Total Bilirubin: 0.3 mg/dL (ref 0.2–1.2)
Total Protein: 6.5 g/dL (ref 6.1–8.1)
eGFR: 49 mL/min/{1.73_m2} — ABNORMAL LOW (ref >=60)

## 2022-09-09 LAB — CBC
HCT: 32.5 % — ABNORMAL LOW (ref 38.5–50.0)
Hemoglobin: 10 g/dL — ABNORMAL LOW (ref 13.2–17.1)
MCH: 25.4 pg — ABNORMAL LOW (ref 27.0–33.0)
MCHC: 30.8 g/dL — ABNORMAL LOW (ref 32.0–36.0)
MCV: 82.7 fL (ref 80.0–100.0)
MPV: 10.6 fL (ref 7.5–12.5)
Platelets: 359 10*3/uL (ref 140–400)
RBC: 3.93 10*6/uL — ABNORMAL LOW (ref 4.20–5.80)
RDW: 17.1 % — ABNORMAL HIGH (ref 11.0–15.0)
WBC: 4.8 10*3/uL (ref 3.8–10.8)

## 2022-09-09 LAB — TSH: TSH: 4.35 mIU/L (ref 0.40–4.50)

## 2023-06-23 NOTE — Progress Notes (Signed)
Cardiology Office Note:  .   Date:  06/24/2023  ID:  Nathan Perez, DOB [birth year], MRN [id number] PCP: Care, Jovita Kussmaul [facility]  [facility] Providers Cardiologist:  None    Patient Profile: .      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 Elease Hashimoto on 06/08/22.  At that office visit he was doing well from cardiac perspective.  He was advised to stop aspirin secondary to [facility] for PAF.  Recommendation to return in 1 year for follow-up.       History of Present Illness: .   Nathan Perez is a very pleasant 77 y.o. male who is here today for annual follow-up. Reports he is feeling well. Continues to do yard work, push mows his lawn. Does other work around the house. Has occasional chest pains that last for 5 seconds or so for which he takes a nitroglycerin and pain is resolved. Has swelling in the back of his foot that has been present for many years, no increase in edema recently. Home SBP typically 120-130 mmHg. PCP told him to drink a lot of water for his kidneys, sweats a lot because he likes to be outside. He denies chest pain, shortness of breath, lower extremity edema, fatigue, palpitations, melena, hematuria, hemoptysis, diaphoresis, weakness, presyncope, syncope, orthopnea, and PND. Denies specific cardiac concerns. No problems with his medications.   ROS: see HPI       Studies Reviewed: Marland Kitchen   EKG  Interpretation Date/Time:  Thursday June 24 2023 15:53:27 EDT Ventricular Rate:  58 PR Interval:  178 QRS Duration:  82 QT Interval:  394 QTC Calculation: 386 R Axis:   14  Text Interpretation: Sinus bradycardia When compared with ECG of 24-Jun-2023 15:51, No significant change was found Confirmed by Eligha Bridegroom [phone]) on 06/24/2023 4:03:19 PM     Risk Assessment/Calculations:    CHA2DS2-VASc Score = 6   This indicates a 9.7% annual risk of stroke. The patient's score is based upon: CHF History: 1 HTN History: 1 Diabetes History: 0 Stroke History: 2 Vascular Disease History: 0 Age Score: 2 Gender Score: 0            Physical Exam:   VS:  BP 132/68   Pulse (!) 58   Ht 6\' 1"  (1.854 m)   Wt 145 lb 6.4 oz (66 kg)   SpO2 98%   BMI 19.18 kg/m    Wt Readings from Last 3 Encounters:  06/24/23 145 lb 6.4 oz (66 kg)  06/08/22 150 lb 12.8 oz (68.4 kg)  09/29/20 143 lb 15.4 oz (65.3 kg)    GEN: Well nourished, well developed in no acute distress NECK: No JVD; No carotid bruits CARDIAC: RRR, no murmurs, rubs, gallops RESPIRATORY:  Clear to auscultation without rales, wheezing or rhonchi  ABDOMEN: Soft, non-tender, non-distended EXTREMITIES:  No edema; No deformity     ASSESSMENT AND PLAN: .    PAF: EKG  today reveals sinus bradycardia at 58 bpm. He denies palpitations, tachycardia. Continue Eliquis 5 mg twice daily which is appropriate dose for stroke prevention for CHA2DS2-VASc score of 6.  Continue metoprolol for rate control.  Chronic combined CHF/NICM: NYHA Class I-II. Initial EF s/p CVA 25-30%, with normalization of LVEF 06/2018 following CVA, thought to be induced by AF RVR. He is feeling well and remains active in his yard and around his home. He denies dyspnea, orthopnea, PND, edema, or weight gain.  Appears euvolemic on exam today.  He denies problems with medication. Continue Entresto and metoprolol.   Hypertension: BP is well-controlled.  He monitors  consistently at home with BP consistently < 140/80. No medication changes today.  Hyperlipidemia: LDL 80 on 09/08/22. We will recheck today.  Continue atorvastatin.       Dispo: 1 year with Dr. Elease Hashimoto or APP  Signed, Eligha Bridegroom, NP-C

## 2023-06-24 ENCOUNTER — Ambulatory Visit: Payer: Medicare HMO | Attending: Nurse Practitioner | Admitting: Nurse Practitioner

## 2023-06-24 ENCOUNTER — Encounter: Payer: Self-pay | Admitting: Nurse Practitioner

## 2023-06-24 VITALS — BP 132/68 | HR 58 | Ht 73.0 in | Wt 145.4 lb

## 2023-06-24 DIAGNOSIS — I5042 Chronic combined systolic (congestive) and diastolic (congestive) heart failure: Secondary | ICD-10-CM | POA: Diagnosis not present

## 2023-06-24 DIAGNOSIS — Z7901 Long term (current) use of anticoagulants: Secondary | ICD-10-CM

## 2023-06-24 DIAGNOSIS — I4892 Unspecified atrial flutter: Secondary | ICD-10-CM

## 2023-06-24 DIAGNOSIS — I10 Essential (primary) hypertension: Secondary | ICD-10-CM

## 2023-06-24 NOTE — Patient Instructions (Signed)
Medication Instructions:  Your physician recommends that you continue on your current medications as directed. Please refer to the Current Medication list given to you today.  *If you need a refill on your cardiac medications before your next appointment, please call your pharmacy*   Lab Work: Lipid and CMET today.  If you have labs (blood work) drawn today and your tests are completely normal, you will receive your results only by: MyChart Message (if you have MyChart) OR A paper copy in the mail If you have any lab test that is abnormal or we need to change your treatment, we will call you to review the results.   Testing/Procedures: None   Follow-Up: At [facility], you and your health needs are our priority.  As part of our continuing mission to provide you with exceptional heart care, we have created designated Provider Care Teams.  These Care Teams include your primary Cardiologist (physician) and Advanced Practice Providers (APPs -  Physician Assistants and Nurse Practitioners) who all work together to provide you with the care you need, when you need it.  We recommend signing up for the patient portal called "MyChart".  Sign up information is provided on this After Visit Summary.  MyChart is used to connect with patients for Virtual Visits (Telemedicine).  Patients are able to view lab/test results, encounter notes, upcoming appointments, etc.  Non-urgent messages can be sent to your provider as well.   To learn more about what you can do with MyChart, go to [email or web address].    Your next appointment:   1 year(s)  Provider:    Kristeen Miss, MD or Eligha Bridegroom, NP

## 2023-06-25 LAB — COMPREHENSIVE METABOLIC PANEL
ALT: 13 IU/L (ref 0–44)
AST: 19 IU/L (ref 0–40)
Albumin: 4.6 g/dL (ref 3.8–4.8)
Alkaline Phosphatase: 59 IU/L (ref 44–121)
BUN/Creatinine Ratio: 15 (ref 10–24)
BUN: 24 mg/dL (ref 8–27)
Bilirubin Total: 0.6 mg/dL (ref 0.0–1.2)
CO2: 23 mmol/L (ref 20–29)
Calcium: 10.2 mg/dL (ref 8.6–10.2)
Chloride: 103 mmol/L (ref 96–106)
Creatinine, Ser: 1.56 mg/dL — ABNORMAL HIGH (ref 0.76–1.27)
Globulin, Total: 2.4 g/dL (ref 1.5–4.5)
Glucose: 81 mg/dL (ref 70–99)
Potassium: 5.2 mmol/L (ref 3.5–5.2)
Sodium: 141 mmol/L (ref 134–144)
Total Protein: 7 g/dL (ref 6.0–8.5)
eGFR: 45 mL/min/{1.73_m2} — ABNORMAL LOW (ref >59)

## 2023-06-25 LAB — LIPID PANEL
Chol/HDL Ratio: 1.7 ratio (ref 0.0–5.0)
Cholesterol, Total: 141 mg/dL (ref 100–199)
HDL: 84 mg/dL (ref >39)
LDL Chol Calc (NIH): 45 mg/dL (ref 0–99)
Triglycerides: 53 mg/dL (ref 0–149)
VLDL Cholesterol Cal: 12 mg/dL (ref 5–40)

## 2023-07-13 ENCOUNTER — Other Ambulatory Visit: Payer: Self-pay | Admitting: Internal Medicine

## 2024-08-24 NOTE — Progress Notes (Deleted)
       Cardiology Office Note Date:  08/24/2024  ID:  Nathan Perez, DOB 1946-06-04, MRN [id number] PCP:  Care, Janit Griffins [facility]  Cardiologist: Joelle VEAR Ren Donley, MD  No chief complaint on file.    Problems Aflutter s/p DCCV 10/2017 C/b CVA Metop XL25 + AN5 HFrecoveredEF (15%--> 60-65% 06/2018) SPECT 9/18 w/ evidence of prior infarct and ischemia but no cath given CVA SV 97-103 HTN/HLD on CE25, AN20--> LDL 45 7/24  Visits       History of Present Illness: Nathan Perez is a 78 y.o. male who presents for follow up.   ROS: Otherwise negative  Physical Exam VS:  There were no vitals taken for this visit. , BMI There is no height or weight on file to calculate BMI. GEN: Well nourished, well developed, in no acute distress HEENT: normal Neck: no JVD, carotid bruits, or masses Cardiac: ***RRR; no murmurs, rubs, or gallops,no edema  Respiratory:  CTAB bilaterally, normal work of breathing GI: soft, nontender, nondistended, + BS Extremities: No LE edema Skin: warm and dry, no rash Neuro:  Strength and sensation are intact  Recent Labs: No results found for requested labs within last 365 days.      Component Value Date/Time   CHOL 149 07/13/2023 0950   CHOL 141 06/24/2023 1620   TRIG 47 07/13/2023 0950   HDL 82 07/13/2023 0950   HDL 84 06/24/2023 1620   CHOLHDL 1.8 07/13/2023 0950   VLDL 12 09/09/2017 0925   LDLCALC 54 07/13/2023 0950     Wt Readings from Last 5 Encounters:  06/24/23 145 lb 6.4 oz (66 kg)  06/08/22 150 lb 12.8 oz (68.4 kg)  09/29/20 143 lb 15.4 oz (65.3 kg)  09/30/18 153 lb (69.4 kg)  06/28/18 153 lb 12.8 oz (69.8 kg)     BP Readings from Last 5 Encounters:  06/24/23 132/68  06/08/22 122/60  10/02/20 117/90  09/30/18 134/62  07/21/18 132/64    ASSESSMENT AND PLAN Nathan Perez is a 78 y.o. male who presents for follow up.   #Aflutter s/p DCCV #HFrecoveredEF #Embolic CVA #HTN/HLD     Signed, Joelle VEAR Ren Donley,  MD  08/24/2024 2:50 PM    [facility]

## 2024-08-28 ENCOUNTER — Ambulatory Visit

## 2024-08-28 VITALS — BP 120/72 | HR 62 | Ht 73.0 in | Wt 147.6 lb

## 2024-08-28 DIAGNOSIS — I2510 Atherosclerotic heart disease of native coronary artery without angina pectoris: Secondary | ICD-10-CM

## 2024-08-28 DIAGNOSIS — Z87891 Personal history of nicotine dependence: Secondary | ICD-10-CM

## 2024-08-28 DIAGNOSIS — E785 Hyperlipidemia, unspecified: Secondary | ICD-10-CM

## 2024-08-28 DIAGNOSIS — I429 Cardiomyopathy, unspecified: Secondary | ICD-10-CM

## 2024-08-28 DIAGNOSIS — J449 Chronic obstructive pulmonary disease, unspecified: Secondary | ICD-10-CM

## 2024-08-28 DIAGNOSIS — I10 Essential (primary) hypertension: Secondary | ICD-10-CM

## 2024-08-28 DIAGNOSIS — Z8673 Personal history of transient ischemic attack (TIA), and cerebral infarction without residual deficits: Secondary | ICD-10-CM | POA: Diagnosis not present

## 2024-08-28 DIAGNOSIS — I4892 Unspecified atrial flutter: Secondary | ICD-10-CM

## 2024-08-28 DIAGNOSIS — Z136 Encounter for screening for cardiovascular disorders: Secondary | ICD-10-CM

## 2024-08-28 LAB — LIPID PANEL

## 2024-08-28 MED ORDER — CHLORTHALIDONE 25 MG PO TABS: 12.5000 mg | ORAL_TABLET | Freq: Every day | ORAL | 3 refills | Status: AC

## 2024-08-28 NOTE — Progress Notes (Signed)
    Cardiology Office Note Date:  08/28/2024  ID:  RIHAN SCHUELER, DOB 11-08-1946, MRN [id number] PCP:  Care, Janit Griffins [facility]  Cardiologist: Joelle VEAR Ren Donley, MD  Chief Complaint  Patient presents with   Follow-up      Problems Aflutter s/p DCCV 10/2017 C/b CVA Metop XL25 + AN5 HFrecoveredEF (15%--> 60-65% 06/2018) SPECT 9/18 w/ evidence of prior infarct and ischemia but no cath given CVA SV 97-103 Non-obstructive CAD HTN/HLD on CE25, AN20--> LDL 45 7/24 Former tobacco use 50-pack year- Quit in 2020  Visits      History of Present Illness: CHARVEZ VOORHIES is a 78 y.o. male who presents for follow up.   He has been doing well since the last visit. He denies any CP w/ exertion. He reports occasional dyspnea that resolves with inhaler. He denies orthopnea, PND or LE edema and has been adherent to his medications. He checks his BP about 1x/week and it has been 120s/70s.   ROS: Please see the history of present illness. All other systems are reviewed and negative.   PHYSICAL EXAM: VS:  BP 120/72   Pulse 62   Ht 6' 1 (1.854 m)   Wt 147 lb 9.6 oz (67 kg)   SpO2 95%   BMI 19.47 kg/m  , BMI Body mass index is 19.47 kg/m. GEN: Well nourished, well developed, in no acute distress HEENT: normal Neck: no JVD, carotid bruits, or masses Cardiac: RRR; no murmurs, rubs, or gallops,no edema  Respiratory:  CTAB bilaterally, normal work of breathing GI: soft, nontender, nondistended, + BS Extremities: No LE edema Skin: warm and dry, no rash Neuro:  Strength and sensation are intact  EKG: LVH, PACs  Recent Labs: Reviewed Studies: Reviewed  ASSESSMENT AND PLAN: RAYFORD WILLIAMSEN is a 78 y.o. male who presents for follow up.   #Aflutter s/p DCCV #HFrecoveredEF #Embolic CVA #HTN/HLD - Has been asymptomatic from Aflutter and HF standpoint. BP appears controlled and LDL last year at goal. - Ordered A1C and LP - Given significant tobacco Hx, will also order lung  cancer screening and AAA screening - Follow up in 1 year  Signed, Joelle VEAR Ren Donley, MD  08/28/2024 3:19 PM    [facility]

## 2024-08-28 NOTE — Patient Instructions (Addendum)
 Medication Instructions:  Your physician recommends that you continue on your current medications as directed. Please refer to the Current Medication list given to you today.  *If you need a refill on your cardiac medications before your next appointment, please call your pharmacy*  Lab Work: Lipid Panel and Hgb A1-C If you have labs (blood work) drawn today and your tests are completely normal, you will receive your results only by: MyChart Message (if you have MyChart) OR A paper copy in the mail If you have any lab test that is abnormal or we need to change your treatment, we will call you to review the results.  Testing/Procedures: Abdominal Ultrasound - AAA screening   Follow-Up: At [facility], you and your health needs are our priority.  As part of our continuing mission to provide you with exceptional heart care, our providers are all part of one team.  This team includes your primary Cardiologist (physician) and Advanced Practice Providers or APPs (Physician Assistants and Nurse Practitioners) who all work together to provide you with the care you need, when you need it.  Your next appointment:   12 months with Dr Juluis APP  We recommend signing up for the patient portal called MyChart.  Sign up information is provided on this After Visit Summary.  MyChart is used to connect with patients for Virtual Visits (Telemedicine).  Patients are able to view lab/test results, encounter notes, upcoming appointments, etc.  Non-urgent messages can be sent to your provider as well.   To learn more about what you can do with MyChart, go to [email or web address].   Other Instructions Referral to Pulmonary

## 2024-08-29 ENCOUNTER — Ambulatory Visit: Payer: Self-pay

## 2024-08-29 LAB — LIPID PANEL
Cholesterol, Total: 173 mg/dL (ref 100–199)
HDL: 89 mg/dL (ref >39)
LDL CALC COMMENT:: 1.9 ratio (ref 0.0–5.0)
LDL Chol Calc (NIH): 74 mg/dL (ref 0–99)
Triglycerides: 45 mg/dL (ref 0–149)
VLDL Cholesterol Cal: 10 mg/dL (ref 5–40)

## 2024-08-29 LAB — HEMOGLOBIN A1C
Est. average glucose Bld gHb Est-mCnc: 111 mg/dL
Hgb A1c MFr Bld: 5.5 % (ref 4.8–5.6)

## 2024-09-08 ENCOUNTER — Ambulatory Visit (HOSPITAL_COMMUNITY)
Admission: RE | Admit: 2024-09-08 | Discharge: 2024-09-08 | Disposition: A | Discharge status: Home / Self Care | Source: Ambulatory Visit

## 2024-09-08 DIAGNOSIS — Z136 Encounter for screening for cardiovascular disorders: Secondary | ICD-10-CM | POA: Diagnosis present

## 2024-09-08 NOTE — Progress Notes (Signed)
 Per Dr. Ren will send mail with results.
